# Patient Record
Sex: Female | Born: 1940
Health system: Southern US, Community
[De-identification: ages and names within clinical notes are randomized; demographics above are authoritative.]

## PROBLEM LIST (undated history)

## (undated) DIAGNOSIS — R0600 Dyspnea, unspecified: Secondary | ICD-10-CM

## (undated) DIAGNOSIS — C50919 Malignant neoplasm of unspecified site of unspecified female breast: Secondary | ICD-10-CM

## (undated) DIAGNOSIS — I10 Essential (primary) hypertension: Secondary | ICD-10-CM

## (undated) DIAGNOSIS — I2510 Atherosclerotic heart disease of native coronary artery without angina pectoris: Secondary | ICD-10-CM

## (undated) DIAGNOSIS — I214 Non-ST elevation (NSTEMI) myocardial infarction: Secondary | ICD-10-CM

## (undated) DIAGNOSIS — R0609 Other forms of dyspnea: Secondary | ICD-10-CM

## (undated) DIAGNOSIS — Z923 Personal history of irradiation: Secondary | ICD-10-CM

## (undated) DIAGNOSIS — Z952 Presence of prosthetic heart valve: Secondary | ICD-10-CM

## (undated) HISTORY — DX: Essential (primary) hypertension: I10

## (undated) HISTORY — PX: ABDOMINAL HYSTERECTOMY: SHX81

## (undated) HISTORY — DX: Presence of prosthetic heart valve: Z95.2

## (undated) HISTORY — DX: Dyspnea, unspecified: R06.00

## (undated) HISTORY — DX: Atherosclerotic heart disease of native coronary artery without angina pectoris: I25.10

## (undated) HISTORY — DX: Other forms of dyspnea: R06.09

## (undated) HISTORY — DX: Malignant neoplasm of unspecified site of unspecified female breast: C50.919

## (undated) HISTORY — DX: Non-ST elevation (NSTEMI) myocardial infarction: I21.4

---

## 2003-05-25 ENCOUNTER — Emergency Department (HOSPITAL_COMMUNITY): Admission: EM | Admit: 2003-05-25 | Discharge: 2003-05-25 | Payer: Self-pay | Admitting: Family Medicine

## 2004-08-11 HISTORY — PX: CARDIAC CATHETERIZATION: SHX172

## 2004-09-08 ENCOUNTER — Inpatient Hospital Stay (HOSPITAL_COMMUNITY): Admission: RE | Admit: 2004-09-08 | Discharge: 2004-09-14 | Payer: Self-pay | Admitting: Cardiothoracic Surgery

## 2004-09-08 ENCOUNTER — Encounter (INDEPENDENT_AMBULATORY_CARE_PROVIDER_SITE_OTHER): Payer: Self-pay | Admitting: *Deleted

## 2004-09-12 HISTORY — PX: AORTIC VALVE REPLACEMENT: SHX41

## 2004-10-01 ENCOUNTER — Encounter: Admission: RE | Admit: 2004-10-01 | Discharge: 2004-10-01 | Payer: Self-pay | Admitting: Cardiothoracic Surgery

## 2004-12-25 DIAGNOSIS — I214 Non-ST elevation (NSTEMI) myocardial infarction: Secondary | ICD-10-CM

## 2004-12-25 HISTORY — DX: Non-ST elevation (NSTEMI) myocardial infarction: I21.4

## 2005-01-08 ENCOUNTER — Ambulatory Visit (HOSPITAL_COMMUNITY): Admission: RE | Admit: 2005-01-08 | Discharge: 2005-01-08 | Payer: Self-pay | Admitting: Cardiology

## 2005-01-19 ENCOUNTER — Inpatient Hospital Stay (HOSPITAL_COMMUNITY): Admission: EM | Admit: 2005-01-19 | Discharge: 2005-01-26 | Payer: Self-pay | Admitting: *Deleted

## 2005-01-20 ENCOUNTER — Encounter (INDEPENDENT_AMBULATORY_CARE_PROVIDER_SITE_OTHER): Payer: Self-pay | Admitting: Cardiology

## 2005-01-20 HISTORY — PX: CARDIAC CATHETERIZATION: SHX172

## 2005-02-18 ENCOUNTER — Encounter (HOSPITAL_COMMUNITY): Admission: RE | Admit: 2005-02-18 | Discharge: 2005-05-19 | Payer: Self-pay | Admitting: Cardiology

## 2008-10-22 ENCOUNTER — Encounter: Admission: RE | Admit: 2008-10-22 | Discharge: 2008-10-22 | Payer: Self-pay | Admitting: Family Medicine

## 2009-03-24 ENCOUNTER — Inpatient Hospital Stay (HOSPITAL_COMMUNITY): Admission: EM | Admit: 2009-03-24 | Discharge: 2009-03-30 | Payer: Self-pay | Admitting: Emergency Medicine

## 2009-03-25 HISTORY — PX: CARDIAC CATHETERIZATION: SHX172

## 2010-04-15 LAB — POCT I-STAT, CHEM 8
BUN: 17 mg/dL (ref 6–23)
Calcium, Ion: 1.14 mmol/L (ref 1.12–1.32)
Chloride: 104 mEq/L (ref 96–112)
Creatinine, Ser: 0.8 mg/dL (ref 0.4–1.2)
Glucose, Bld: 101 mg/dL — ABNORMAL HIGH (ref 70–99)
HCT: 39 % (ref 36.0–46.0)
Hemoglobin: 13.3 g/dL (ref 12.0–15.0)
Potassium: 3.6 mEq/L (ref 3.5–5.1)
Sodium: 141 mEq/L (ref 135–145)
TCO2: 29 mmol/L (ref 0–100)

## 2010-04-15 LAB — URINALYSIS, ROUTINE W REFLEX MICROSCOPIC
Bilirubin Urine: NEGATIVE
Glucose, UA: NEGATIVE mg/dL
Hgb urine dipstick: NEGATIVE
Ketones, ur: NEGATIVE mg/dL
Nitrite: NEGATIVE
Protein, ur: NEGATIVE mg/dL
Specific Gravity, Urine: 1.012 (ref 1.005–1.030)
Urobilinogen, UA: 0.2 mg/dL (ref 0.0–1.0)
pH: 6.5 (ref 5.0–8.0)

## 2010-04-15 LAB — COMPREHENSIVE METABOLIC PANEL
ALT: 21 U/L (ref 0–35)
AST: 30 U/L (ref 0–37)
Albumin: 4.1 g/dL (ref 3.5–5.2)
Alkaline Phosphatase: 52 U/L (ref 39–117)
BUN: 13 mg/dL (ref 6–23)
CO2: 29 mEq/L (ref 19–32)
Calcium: 9.7 mg/dL (ref 8.4–10.5)
Chloride: 104 mEq/L (ref 96–112)
Creatinine, Ser: 0.83 mg/dL (ref 0.4–1.2)
GFR calc Af Amer: >60 mL/min (ref >60)
GFR calc non Af Amer: >60 mL/min (ref >60)
Glucose, Bld: 107 mg/dL — ABNORMAL HIGH (ref 70–99)
Potassium: 3.7 mEq/L (ref 3.5–5.1)
Sodium: 139 mEq/L (ref 135–145)
Total Bilirubin: 0.6 mg/dL (ref 0.3–1.2)
Total Protein: 7.4 g/dL (ref 6.0–8.3)

## 2010-04-15 LAB — DIFFERENTIAL
Basophils Absolute: 0 10*3/uL (ref 0.0–0.1)
Basophils Relative: 1 % (ref 0–1)
Eosinophils Absolute: 0.1 10*3/uL (ref 0.0–0.7)
Eosinophils Relative: 3 % (ref 0–5)
Lymphocytes Relative: 38 % (ref 12–46)
Lymphs Abs: 2 10*3/uL (ref 0.7–4.0)
Monocytes Absolute: 0.4 10*3/uL (ref 0.1–1.0)
Monocytes Relative: 8 % (ref 3–12)
Neutro Abs: 2.7 10*3/uL (ref 1.7–7.7)
Neutrophils Relative %: 52 % (ref 43–77)

## 2010-04-15 LAB — CBC
HCT: 38 % (ref 36.0–46.0)
Hemoglobin: 13.2 g/dL (ref 12.0–15.0)
MCHC: 34.6 g/dL (ref 30.0–36.0)
MCV: 93.1 fL (ref 78.0–100.0)
Platelets: 332 10*3/uL (ref 150–400)
RBC: 4.09 MIL/uL (ref 3.87–5.11)
RDW: 12.9 % (ref 11.5–15.5)
WBC: 5.3 10*3/uL (ref 4.0–10.5)

## 2010-04-15 LAB — CK TOTAL AND CKMB (NOT AT ARMC)
CK, MB: 1.7 ng/mL (ref 0.3–4.0)
Relative Index: 1.2 (ref 0.0–2.5)
Total CK: 144 U/L (ref 7–177)

## 2010-04-15 LAB — POCT CARDIAC MARKERS
CKMB, poc: 1.6 ng/mL (ref 1.0–8.0)
Myoglobin, poc: 103 ng/mL (ref 12–200)
Troponin i, poc: <0.05 ng/mL (ref 0.00–0.09)

## 2010-04-15 LAB — MRSA PCR SCREENING: MRSA by PCR: NEGATIVE

## 2010-04-15 LAB — PROTIME-INR
INR: 1.57 — ABNORMAL HIGH (ref 0.00–1.49)
Prothrombin Time: 18.6 seconds — ABNORMAL HIGH (ref 11.6–15.2)

## 2010-04-15 LAB — TROPONIN I: Troponin I: <0.01 ng/mL (ref 0.00–0.06)

## 2010-04-15 LAB — TSH: TSH: 2.589 u[IU]/mL (ref 0.350–4.500)

## 2010-04-16 ENCOUNTER — Other Ambulatory Visit: Payer: Self-pay | Admitting: Family Medicine

## 2010-04-16 DIAGNOSIS — M549 Dorsalgia, unspecified: Secondary | ICD-10-CM

## 2010-04-17 ENCOUNTER — Ambulatory Visit
Admission: RE | Admit: 2010-04-17 | Discharge: 2010-04-17 | Disposition: A | Discharge status: Home / Self Care | Payer: Medicare Other | Source: Ambulatory Visit | Attending: Family Medicine | Admitting: Family Medicine

## 2010-04-17 DIAGNOSIS — M549 Dorsalgia, unspecified: Secondary | ICD-10-CM

## 2010-04-19 LAB — CBC
HCT: 33.9 % — ABNORMAL LOW (ref 36.0–46.0)
HCT: 34.5 % — ABNORMAL LOW (ref 36.0–46.0)
HCT: 34.7 % — ABNORMAL LOW (ref 36.0–46.0)
Hemoglobin: 11.8 g/dL — ABNORMAL LOW (ref 12.0–15.0)
Hemoglobin: 13.8 g/dL (ref 12.0–15.0)
MCHC: 34.7 g/dL (ref 30.0–36.0)
MCHC: 34.8 g/dL (ref 30.0–36.0)
MCHC: 34.8 g/dL (ref 30.0–36.0)
MCHC: 34.8 g/dL (ref 30.0–36.0)
MCHC: 35.5 g/dL (ref 30.0–36.0)
MCV: 92.1 fL (ref 78.0–100.0)
MCV: 92.3 fL (ref 78.0–100.0)
MCV: 92.5 fL (ref 78.0–100.0)
MCV: 92.9 fL (ref 78.0–100.0)
MCV: 93.5 fL (ref 78.0–100.0)
Platelets: 271 10*3/uL (ref 150–400)
Platelets: 275 10*3/uL (ref 150–400)
Platelets: 280 10*3/uL (ref 150–400)
Platelets: 289 10*3/uL (ref 150–400)
Platelets: 296 10*3/uL (ref 150–400)
RBC: 3.75 MIL/uL — ABNORMAL LOW (ref 3.87–5.11)
RDW: 13.1 % (ref 11.5–15.5)
RDW: 13.3 % (ref 11.5–15.5)
RDW: 13.4 % (ref 11.5–15.5)
RDW: 13.5 % (ref 11.5–15.5)
WBC: 5.1 10*3/uL (ref 4.0–10.5)
WBC: 5.4 10*3/uL (ref 4.0–10.5)

## 2010-04-19 LAB — BASIC METABOLIC PANEL
BUN: 15 mg/dL (ref 6–23)
BUN: 16 mg/dL (ref 6–23)
BUN: 16 mg/dL (ref 6–23)
BUN: 16 mg/dL (ref 6–23)
BUN: 17 mg/dL (ref 6–23)
CO2: 20 mEq/L (ref 19–32)
CO2: 26 mEq/L (ref 19–32)
CO2: 27 mEq/L (ref 19–32)
CO2: 28 mEq/L (ref 19–32)
Calcium: 8.9 mg/dL (ref 8.4–10.5)
Calcium: 8.9 mg/dL (ref 8.4–10.5)
Chloride: 104 mEq/L (ref 96–112)
Chloride: 109 mEq/L (ref 96–112)
Creatinine, Ser: 0.76 mg/dL (ref 0.4–1.2)
Creatinine, Ser: 0.79 mg/dL (ref 0.4–1.2)
Creatinine, Ser: 0.82 mg/dL (ref 0.4–1.2)
Creatinine, Ser: 0.84 mg/dL (ref 0.4–1.2)
GFR calc Af Amer: >60 mL/min (ref >60)
GFR calc non Af Amer: >60 mL/min (ref >60)
GFR calc non Af Amer: >60 mL/min (ref >60)
Glucose, Bld: 101 mg/dL — ABNORMAL HIGH (ref 70–99)
Glucose, Bld: 103 mg/dL — ABNORMAL HIGH (ref 70–99)
Glucose, Bld: 114 mg/dL — ABNORMAL HIGH (ref 70–99)
Potassium: 3.3 mEq/L — ABNORMAL LOW (ref 3.5–5.1)
Potassium: 3.8 mEq/L (ref 3.5–5.1)
Potassium: 3.8 mEq/L (ref 3.5–5.1)
Sodium: 138 mEq/L (ref 135–145)

## 2010-04-19 LAB — URINALYSIS, ROUTINE W REFLEX MICROSCOPIC
Glucose, UA: NEGATIVE mg/dL
Hgb urine dipstick: NEGATIVE
Protein, ur: NEGATIVE mg/dL
pH: 8 (ref 5.0–8.0)

## 2010-04-19 LAB — POTASSIUM: Potassium: 3.7 mEq/L (ref 3.5–5.1)

## 2010-04-19 LAB — LIPID PANEL
HDL: 39 mg/dL — ABNORMAL LOW (ref >39)
LDL Cholesterol: 71 mg/dL (ref 0–99)
Triglycerides: 57 mg/dL (ref <150)
VLDL: 11 mg/dL (ref 0–40)

## 2010-04-19 LAB — CARDIAC PANEL(CRET KIN+CKTOT+MB+TROPI)
CK, MB: 1.3 ng/mL (ref 0.3–4.0)
Relative Index: INVALID (ref 0.0–2.5)
Total CK: 84 U/L (ref 7–177)
Troponin I: 0.01 ng/mL (ref 0.00–0.06)
Troponin I: 0.02 ng/mL (ref 0.00–0.06)

## 2010-04-19 LAB — HEPARIN LEVEL (UNFRACTIONATED)
Heparin Unfractionated: 0.16 IU/mL — ABNORMAL LOW (ref 0.30–0.70)
Heparin Unfractionated: 0.31 IU/mL (ref 0.30–0.70)
Heparin Unfractionated: 0.73 IU/mL — ABNORMAL HIGH (ref 0.30–0.70)

## 2010-04-19 LAB — PROTIME-INR
INR: 1.21 (ref 0.00–1.49)
Prothrombin Time: 15.2 seconds (ref 11.6–15.2)
Prothrombin Time: 16.8 seconds — ABNORMAL HIGH (ref 11.6–15.2)
Prothrombin Time: 18.8 seconds — ABNORMAL HIGH (ref 11.6–15.2)

## 2010-05-06 ENCOUNTER — Other Ambulatory Visit: Payer: Self-pay | Admitting: Dermatology

## 2010-06-10 ENCOUNTER — Ambulatory Visit: Payer: Medicare Other | Attending: Gynecologic Oncology | Admitting: Gynecologic Oncology

## 2010-06-10 DIAGNOSIS — N83209 Unspecified ovarian cyst, unspecified side: Secondary | ICD-10-CM | POA: Insufficient documentation

## 2010-06-10 DIAGNOSIS — Z79899 Other long term (current) drug therapy: Secondary | ICD-10-CM | POA: Insufficient documentation

## 2010-06-10 DIAGNOSIS — I10 Essential (primary) hypertension: Secondary | ICD-10-CM | POA: Insufficient documentation

## 2010-06-10 DIAGNOSIS — Z9861 Coronary angioplasty status: Secondary | ICD-10-CM | POA: Insufficient documentation

## 2010-06-10 DIAGNOSIS — M899 Disorder of bone, unspecified: Secondary | ICD-10-CM | POA: Insufficient documentation

## 2010-06-10 DIAGNOSIS — I252 Old myocardial infarction: Secondary | ICD-10-CM | POA: Insufficient documentation

## 2010-06-10 DIAGNOSIS — Z85828 Personal history of other malignant neoplasm of skin: Secondary | ICD-10-CM | POA: Insufficient documentation

## 2010-06-10 DIAGNOSIS — R978 Other abnormal tumor markers: Secondary | ICD-10-CM | POA: Insufficient documentation

## 2010-06-10 DIAGNOSIS — E7800 Pure hypercholesterolemia, unspecified: Secondary | ICD-10-CM | POA: Insufficient documentation

## 2010-06-10 DIAGNOSIS — I2510 Atherosclerotic heart disease of native coronary artery without angina pectoris: Secondary | ICD-10-CM | POA: Insufficient documentation

## 2010-06-10 DIAGNOSIS — Z7901 Long term (current) use of anticoagulants: Secondary | ICD-10-CM | POA: Insufficient documentation

## 2010-06-10 DIAGNOSIS — M949 Disorder of cartilage, unspecified: Secondary | ICD-10-CM | POA: Insufficient documentation

## 2010-06-10 DIAGNOSIS — Z889 Allergy status to unspecified drugs, medicaments and biological substances status: Secondary | ICD-10-CM | POA: Insufficient documentation

## 2010-06-10 DIAGNOSIS — R42 Dizziness and giddiness: Secondary | ICD-10-CM | POA: Insufficient documentation

## 2010-06-10 DIAGNOSIS — Z954 Presence of other heart-valve replacement: Secondary | ICD-10-CM | POA: Insufficient documentation

## 2010-06-10 DIAGNOSIS — Z7982 Long term (current) use of aspirin: Secondary | ICD-10-CM | POA: Insufficient documentation

## 2010-06-11 NOTE — Consult Note (Signed)
NAMEFAIZAH, Rose                ACCOUNT NO.:  [id number]  MEDICAL RECORD NO.:  [id number]           PATIENT TYPE:  O  LOCATION:  GYN                          FACILITY:  [facility]  PHYSICIAN:  Ohana Birdwell A. Duard Brady, MD    DATE OF BIRTH:  [birth year]  DATE OF CONSULTATION:  06/10/2010 DATE OF DISCHARGE:                                CONSULTATION   REFERRING PHYSICIAN:  Patsy Baltimore, MD  HISTORY OF PRESENT ILLNESS:  The patient is seen today in consultation at the request of Dr. Gevena Cotton.  [name] is a very pleasant 70 year old gravida 1, para 1.  Her history dates back to 2010.  Her doctors felt that she could not have a colonoscopy secondary to being on Coumadin for her AVR replacement, so she had a "virtual colonoscopy" and had a CT scan that showed these adnexal masses.  She has been followed up for them since 2010.  In 2010, she had a right ovarian cyst that measured 3.3 x 2.3 cm.  In 2011, the right ovarian cyst measured 3.4 x 2.6 cm with a thin septation.  The left ovarian cyst was 1.3 x 1.3 x 1.2 cm. In 2012, the cyst was 3.4 x 2.4 x 2 cm with a thin septation.  The left ovarian cyst was 1.4 x 1 x 1.1 cm.  This septation was avascular. However, in April 2012, a CA-125 was drawn that was 37.5, and because of that slight elevation, she is subsequently referred to [place].  The patient is otherwise in her usual state of health.  REVIEW OF SYSTEMS:  She denies any chest pain, shortness of breath, nausea, vomiting, fevers, chills, headaches, visual changes.  She denies any intentional change in bowel or bladder habits, any vaginal bleeding, any bloating, any early satiety.  She and her husband are not sexually active, so she does not know about any dyspareunia.  PAST MEDICAL HISTORY:  Significant for: 1. Hypertension. 2. Aortic stenosis, status post AVR in August of 2006. 3. CAD, status post an MI and PTCA in December 2006. 4. Hypercholesterolemia. 5. Recurrent vertigo which is  better. 6. Basal cell skin cancer of her face and squamous cell skin cancer of     her shoulder. 7. Migraine headaches but she has not had one for 15 years. 8. History of allergies to medications. 9. Osteopenia.  MEDICATIONS: 1. Amlodipine 5 mg daily. 2. Losartan. 3. HCTZ 100/25 one p.o. daily. 4. Crestor 5 mg daily. 5. Coumadin 5 mg 1-1/2 tablets every day, except 1 tablet on Sunday. 6. Omeprazole 20 mg daily. 7. Fish oil 1000 mg twice daily. 8. Baby aspirin once daily. 9. Calcium with D. 10.Multivitamin. 11.Gabapentin 100 mg twice daily.  PAST SURGICAL HISTORY:  Vaginal hysterectomy in 1973 for bleeding and pain, breast biopsies x3, tonsillectomy, aortic valve replacement, St. Jude in August 2006.  FAMILY HISTORY:  Her father had alcoholism.  Her mother had diabetes, arthritis, CAD, hypertension, and AVR.  Brother with thyroid disease, CAD and AVR.  Sister #1, thyroid disease, CAD, hypertension, AVR. Sister with thyroid disease and carotid disease.  ALLERGIES: 1. CODEINE which causes nausea  and vomiting 2. SULFA which causes nausea and vomiting. 3. VERAPAMIL causes constipation. 4. NITROGLYCERIN causes low blood pressure.  SOCIAL HISTORY:  She denies use of tobacco or alcohol.  She is married. She is retired from [employer].  She works currently as a Lawyer.  PAST GYN HISTORY:  She has had an NSVD x1 in 1971.  REVIEW OF SYSTEMS:  As above, and otherwise 10-point review of systems is negative.  PHYSICAL EXAMINATION:  VITAL SIGNS:  Height is 5 feet 5 inches, weight 200 pounds.  Blood pressure 116/80, pulse 60, respirations 16, temperature 98.5. GENERAL:  Well-nourished, well-developed female in no acute distress. NECK:  Supple.  There is no lymphadenopathy.  No thyromegaly. LUNGS:  Clear to auscultation bilaterally. CARDIOVASCULAR:  Regular rate and rhythm. ABDOMEN:  Obese, soft, nontender, nondistended.  No palpable masses or hepatosplenomegaly.   Groins are negative for adenopathy. EXTREMITIES:  There is no edema. PELVIC:  External genitalia within normal limits.  Bimanual examination reveals no masses or nodularity.  ASSESSMENT:  70 year old with stable right ovarian and left ovarian cyst that had been followed expectantly since 2010.  CA-125 was drawn in April that was elevated at 37.5.  It is really unclear what to make of this slight elevation her CA-125, but as the cyst has not changed, her septation is avascular, she is asymptomatic, there is no ascites noted on ultrasound, I would follow this expectantly.  The patient is also high-risk for surgery based on her multiple medical issues.  PLAN:  I would recommend rechecking a CA-125 in 6 months from the last one, so that would be October of 2012.  If that is the same level or lower, then I would not necessarily recheck those in the future and would just merely follow her up with transvaginal ultrasound yearly. That being said, I did review with her signs and symptoms of ovarian carcinoma including bloating, early satiety, abdominal and pelvic pain, change in bowel or bladder habits. She was counseled that if any of these symptoms appear to please contact us or Dr Gevena Cotton.  At this time, I will not schedule an appointment for her to return to see [place] automatically.  Will ask Dr. Gevena Cotton to check the CA-125.  She knows we will be happy to see her in the future should the need arise.     Nolia Tschantz A. Duard Brady, MD     PAG/MEDQ  D:  06/10/2010  T:  06/10/2010  Job:  [id number]  cc:   Bryan Lemma. Manus Gunning, M.D. Fax: [phone]  Telford Nab, R.N. [address] [place], [place] [address]  Patsy Baltimore, MD Fax: [phone] \  Electronically Signed by Cleda Mccreedy MD on 06/11/2010 02:10:35 PM

## 2010-06-12 NOTE — Discharge Summary (Signed)
Sharon Rose, CASLIN NO.:  [id number]   MEDICAL RECORD NO.:  [id number]          PATIENT TYPE:  INP   LOCATION:  2040                         FACILITY:  [facility]   PHYSICIAN:  Thereasa Solo. Little, M.D. DATE OF BIRTH:  1940/12/17   DATE OF ADMISSION:  01/19/2005  DATE OF DISCHARGE:  01/26/2005                                 DISCHARGE SUMMARY   DISCHARGE DIAGNOSES:  1.  ST myocardial infarction to OM-2 occlusion this admission.  2.  History of aortic stenosis, status post St. Jude AVR August 2006.  3.  Past history of paroxysmal atrial fibrillation postoperatively August      2006, holding sinus rhythm.  4.  Dyslipidemia   HOSPITAL COURSE:  The patient is a 70 year old female who had a St. Jude AVR  August 2006 by Dr. Tyrone Sage. This was complicated by postoperative atrial  fibrillation which required DC cardioversion, she has been holding sinus  rhythm. At the time of her catheterization she had normal coronaries. She  was admitted January 19, 2005 with chest pain consistent with unstable  angina. Initial enzymes were negative. In the emergency room she became  hypotensive after nitroglycerin. She was admitted to telemetry. Her second  set of enzymes came back positive. They peaked at 1780 with 145 mb's. Her  Coumadin was held. She was put on heparin when her INR dropped below 2. She  was set up for catheterization which was done January 22, 2005 Dr. Elsie Lincoln.  This revealed normal RCA, normal left main, normal LAD, normal circumflex  with a total mid OM-2 occlusion with some faint collaterals. Plan is for  medical therapy. We initially tried her on Norvasc. She was unable to  tolerate multiple antihypertensives and these were pared down at discharge.  Echocardiogram showed EF to be 50-55%. She was put back on Coumadin and we  feel she can be discharged January 26, 2005. Her INR is 2.0 at discharge.   DISCHARGE MEDICATIONS:  1.  Coumadin 7.5 mg Monday, Wednesday,  Friday and 5 mg other days.  2.  Aspirin 81 mg a day.  3.  Toprol XL 25 mg a day.  4.  Estrace 1 mg a day.  5.  Claritin 10 mg a day.  6.  Pravachol 40 mg a day.  7.  Prilosec OTC once a day.   She will follow up with Dr. Elsie Lincoln in about a week.   LABORATORY DATA:  White count 5.5, hemoglobin 13, hematocrit 37.5, platelets  315,000. INR at admission was 0.1. Sodium 138, potassium 4.0, BUN 10,  creatinine 0.7. Liver functions on admission showed an AST of 167, ALT of  30, CKs peaked as noted above. Cholesterol was 146, LDL 80, HDL 49. TSH  1.260. Urinalysis unremarkable.   Chest x-ray shows no active disease.   DISPOSITION:  The patient is discharged in stable condition and will follow  up with Dr. Elsie Lincoln in about a week. She will have a pro-time next week. She  will need follow-up LFTs and lipid panel as an outpatient.      Abelino Derrick, P.A.  ______________________________  Thereasa Solo Little, M.D.    Lenard Lance  D:  01/26/2005  T:  01/26/2005  Job:  [id number]   cc:   Bryan Lemma. Manus Gunning, M.D.  Fax: [phone]   Madaline Savage, M.D.  Fax: [phone]

## 2010-06-12 NOTE — Cardiovascular Report (Signed)
Sharon Rose, CAPOCCIA NO.:  [id number]   MEDICAL RECORD NO.:  [id number]          PATIENT TYPE:  INP   LOCATION:  2902                         FACILITY:  [facility]   PHYSICIAN:  Madaline Savage, M.D.DATE OF BIRTH:  [birth year]   DATE OF PROCEDURE:  01/20/2005  DATE OF DISCHARGE:                              CARDIAC CATHETERIZATION   PROCEDURES PERFORMED:  Selective coronary angiography by Judkins technique.  No LV angiogram or retrograde heart cath was performed due to a St. Jude's  aortic valve.   PATIENT PROFILE:  This is a delightful 70 year old white married female who  recently underwent aortic valve replacement on September 13, 2004 by Dr. Sheliah Plane. The patient's pre procedure coronary angiogram failed to show any  evidence of obstructive coronary disease. She had aortic valvular stenosis  and did well from the standpoint of her aortic valve replacement. She did  have some postoperative atrial fibrillation. She entered the hospital on  January 19, 2005 with an episode of chest pain that had been relieved at  the time she was admitted. The first cardiac enzyme and EKG was negative.  She subsequently developed a rise in enzymes and a very faint amount of ST-T  abnormality by the time an EKG was done the next day. She was felt to have a  non-Q-wave myocardial infarction with peak CKs of 1700 and a peak troponin  of 45. No further chest pain has occurred during hospitalization. The  patient has been crossed over from Coumadin to heparin and now that her  prothrombin time, INR has gone down to 1.4 we are able to come to the cath  lab today for coronary artery anatomy evaluation. This procedure was  performed via right percutaneous femoral approach using the Smart needle to  enter the right femoral artery with 6-French catheters without  complications.   RESULTS:  The left main coronary artery was virtually nonexistent. The  circumflex and the LAD arose by  separate ostia.   The LAD was a medium-sized vessel giving rise to one large septal perforator  branch and then the LAD gave rise to one medium-sized diagonal branch about  25 mm down from the first septal perforator branch. This diagonal and LAD  were both normal as well as a septal perforator branch.   The left circumflex coronary artery was a dominant vessel in this  circulation, giving rise to a small first obtuse marginal branch, a second  small but longer obtuse marginal branch, a huge third obtuse marginal branch  and then the circumflex itself coursed through atrioventricular groove and  gave rise to a posterolateral branch. There was noted to be a string sign in  the second obtuse marginal branch over about 40 mm in length and there was  retrograde collateral flow into this vessel by retrograde collaterals. This  was felt not to be amenable to stenting and would not be a suitable  candidate for bypass grafting either.   Right coronary artery was small and nondominant and gave rise to very small  distal branches. It was a nondominant vessel in  circulation.   PLAN:  The patient will be treated medically. She will have her heparin  restarted to protect her valves. She will be restarted on Coumadin. She will  be discharged at the appropriate time on medical therapy for her coronary  disease.   FINAL DIAGNOSIS:  1.  Recent non-Q-wave myocardial infarction by enzyme and clinical enzymes      and clinical information.  2.  Interval occlusion of the second obtuse marginal branch of left      circumflex with retrograde collateral flow (when compared to last      catheterization dated August 31, 2004).   ADDENDUM:  The patient's St. Jude aortic valve shows excellent movement of  the bileaflet tilting disk valve on fluoroscopy.           ______________________________  Madaline Savage, M.D.     WHG/MEDQ  D:  01/22/2005  T:  01/23/2005  Job:  [id number]   cc:   Sheliah Plane,  MD  [address]  [place]  [place] [address]   Bryan Lemma. Manus Gunning, M.D.  Fax: [phone]

## 2010-06-12 NOTE — Discharge Summary (Signed)
NAMESAMIRAH, MACARTHUR NO.:  [id number]   MEDICAL RECORD NO.:  [id number]          PATIENT TYPE:  INP   LOCATION:  2003                         FACILITY:  [facility]   PHYSICIAN:  Sheliah Plane, MD    DATE OF BIRTH:  11-12-1940   DATE OF ADMISSION:  09/08/2004  DATE OF DISCHARGE:  09/14/2004                                 DISCHARGE SUMMARY   PRIMARY ADMITTING DIAGNOSIS:  Aortic stenosis.   ADDITIONAL/DISCHARGE DIAGNOSES:  1.  Aortic stenosis.  2.  Hypertension.  3.  hyperlipidemia.  4.  postoperative anemia.   PROCEDURES PERFORMED:  Aortic valve replacement with 23-mm St. Jude  mechanical aortic valve.   HISTORY:  The patient is a 70 year old female who has been followed by Dr.  Elsie Lincoln for the past 8-9 years with progressive aortic stenosis.  She has  undergone serial echocardiograms over this period of time.  Recently, she  was noted to have progressive aortic stenosis by echo.  Upon questioning,  the patient does note increasing fatigue and shortness of breath with  exertion.  Because of her worsening symptoms and her progressive aortic  stenosis by echo, she underwent cardiac catheterization recently which  showed patent coronary arteries.  Catheterization also confirmed aortic  valve area of approximately 0.9.  She was referred subsequently to Dr.  Sheliah Plane for consideration of aortic valve replacement.  Dr. Tyrone Sage  explained the risks, benefits and alternatives of the procedure to the  patient and she agreed to surgery.   HOSPITAL COURSE:  She was admitted to [facility] on September 08, 2004  and underwent aortic valve replacement as described in detail above,  performed by Dr. Tyrone Sage.  She tolerated the procedure well and was  transferred to the SICU in stable condition.  She was able to be extubated  shortly after surgery.  She was hemodynamically stable and doing well on  postop day #1.  She did require low-dose Neo-Synephrine drip  initially, but  this was weaned over the course of the first postoperative day and  discontinued.  By postop day #2 she was ready for transfer to the floor.  Overall, she has done well postoperatively.  She was started on Coumadin and  at the time of discharge, her INR is therapeutic at 1.8.  She has been  mildly anemic and was started on iron supplementation as well as folic acid.  She has been mobilized and presently is ambulating the hallways without  difficulty with Cardiac Rehab Phase 1 as well as independently.  Her  surgical incision sites are all healing well.  She is tolerating a regular  diet and is having normal bowel and bladder function.  Her most recent labs  prior to discharge show a hemoglobin of 8.2, hematocrit 23.8, white count  4.1, platelets 322.000; PT 20.9, INR 1.8; sodium 136, potassium 3.8, which  has been supplemented, BUN 13, and creatinine 0.8.  Dr. Tyrone Sage saw and  evaluated the patient on morning rounds on September 14, 2004 and felt that she  was ready for discharge home at that time.  DISCHARGE MEDICATIONS:  1.  Toprol-XL 25 mg daily.  2.  Pravachol 20 mg daily.  3.  Coumadin 5 mg daily until INR is checked.  4.  Nu-Iron 150 mg daily.  5.  Folic acid 1 mg daily.  6 .  Estradiol 0.5 mg daily.  1.  Fexofenadine 60 mg twice daily.  2.  Calcium +D 600 mg twice daily.  3.  Multivitamin daily.  4.  Ultram 50 mg to 100 mg q.4-6 h. p.r.n. for pain.   DISCHARGE INSTRUCTIONS:  She is asked to refrain from driving, heavy lifting  or strenuous activity.  She may continue ambulating daily and using her  incentive spirometer.  She may shower daily and clean her incisions with  soap and water.  She will continue a low-fat, low-sodium diet.   DISCHARGE FOLLOWUP:  She is asked to have a PT and INR drawn on September 15, 2004 at Dr. Truett Perna office for management of her Coumadin.  She is also  asked to make an appointment see Dr. Elsie Lincoln back in 2 weeks for followup.  She  will see Dr. Tyrone Sage on October 01, 2004 at 11:10 a.m.  She will have  a chest x-ray 1 hour prior to this appointment at [facility].  In the interim, if she experiences problems or has questions, she is asked  to contact our office.      Coral Ceo, P.A.      Sheliah Plane, MD  Electronically Signed    GC/MEDQ  D:  11/13/2004  T:  11/14/2004  Job:  [id number]   cc:   Madaline Savage, M.D.  Fax: [phone]   Bryan Lemma. Manus Gunning, M.D.  Fax: [phone]

## 2010-06-12 NOTE — Op Note (Signed)
NAMESEYNABOU, FULTS NO.:  [id number]   MEDICAL RECORD NO.:  [id number]          PATIENT TYPE:  INP   LOCATION:  2306                         FACILITY:  [facility]   PHYSICIAN:  Sheldon Silvan, M.D.      DATE OF BIRTH:  Mar 25, 1940   DATE OF PROCEDURE:  09/08/2004  DATE OF DISCHARGE:                                 OPERATIVE REPORT   PROCEDURE PERFORMED:  Transesophageal echocardiography (intraoperative).   Ms. Shelden was brought to the operating room by Dr. Tyrone Sage for replacement  of her aortic valve due to aortic stenosis.  After appropriate placement of  monitors, she was taken to the operating room.  It was determined that use  of TEE intraoperatively would be beneficial for both diagnostic and  monitoring purposes.   DESCRIPTION OF PROCEDURE:  After satisfactory induction of general  anesthesia, including endotracheal intubation, the [employer] OmniPlane  TEE probe was sheathed and lubricated appropriately.  It was passed easily  through the oropharynx into the esophagus.  The heart was imaged.   The left ventricle was seen to be moderately thickened concentrically.  There was good contractility in all four segments.   The left atrial appendage was imaged and found to be clear of thrombus or  smoke.   The mitral valve was imaged in a longitudinal view and on color flow exam as  well.  There was only trace regurgitation noted centrally.  Structurally,  the valve appeared normal.   The aortic valve was imaged and was found to be very sclerotic.  It was  difficult to tell whether or not it was bifid.  On longitudinal view and  Color Flow exam there was obvious aortic stenosis that was severe, with only  trace to 1+ regurgitation centrally.   The right ventricle was imaged and was felt to be normal.  The tricuspid  valve was imaged and on color flow exam had only trace to 1+ regurgitation.   The interatrial septum was examined with color flow and there  was no PFO  noted.   The patient was placed on cardiopulmonary bypass by Sheliah Plane, MD and  the aortic valve replaced with a #23 prosthesis.   At completion of the valve placement and closure of the heart, the patient  was weaned from bypass.  The valve was imaged on TEE and found to be placed  well with no regurgitant flow noted around the suture line.  There was very  little stenosis noted in the forward flow across the valve.  There was no  regurgitation centrally.  The patient tolerated  weaning from the pump adequately.  The left ventricle was imaged and the  images were used for monitoring of filling of the heart.  The other  structures in the heart appeared unchanged form prebypass.  The patient was  transported to the SICU after completion of the operation and removal of the  TEE probe.      Sheldon Silvan, M.D.  Electronically Signed     DC/MEDQ  D:  09/09/2004  T:  09/09/2004  Job:  [id number]

## 2010-06-12 NOTE — Op Note (Signed)
NAMEKATONA, SECKER NO.:  [id number]   MEDICAL RECORD NO.:  [id number]          PATIENT TYPE:  INP   LOCATION:  2003                         FACILITY:  [facility]   PHYSICIAN:  Sheliah Plane, MD    DATE OF BIRTH:  1940-08-29   DATE OF PROCEDURE:  DATE OF DISCHARGE:                                 OPERATIVE REPORT   PREOPERATIVE DIAGNOSIS:  Critical aortic stenosis.   POSTOPERATIVE DIAGNOSIS:  Critical aortic stenosis.   OPERATION/PROCEDURE:  Aortic valve replacement with St. Jude mechanical  heart valve, model #23 AK-501, serial [id number].   SURGEON:  Gwenith Daily. Tyrone Sage, M.D.   FIRST ASSISTANT:  Lennox Solders R.N.F.A.   BRIEF HISTORY:  The patient is a 70 year old female with increasing symptoms  of congestive heart failure with longstanding known history of aortic  stenosis which has become progressive on echocardiography.  Of note, the  patient's brother a week prior to surgery also underwent aortic valve  replacement for aortic stenosis.  The patient has no contraindications for  Coumadin and choice of valve replacement were discussed with her in detail.  She had no evidence of coronary artery disease and was willing to take  Coumadin.  A mechanical valve was selected.  The patient agreed.   DESCRIPTION OF PROCEDURE:  With Swan-Ganz catheter and arterial line  monitors in place, the patient underwent general endotracheal anesthesia  without incident.  The skin of the chest and back was prepped with Betadine  and draped in the usual sterile manner.  Median sternotomy was performed,  pericardium was opened and overall ventricular function appeared preserved.  The patient was preserved.  The patient was systemically heparinized.  Ascending aorta and the right atrium were cannulated and the aortic root  vented.  Cardioplegia needle was introduced into the ascending aorta.   The patient was placed on cardiopulmonary bypass, 2.4 L/per minute/sq m.  A  right superior pulmonary vein vent was placed.  The patient's body  temperature was cooled to 30 degrees.  Aortic crossclamp was applied and 500  mL of cold blood potassium cardioplegia was administered with rapid  diastolic arrest of the heart.  Myocardial central temperature was monitored  throughout the crossclamp.   Attention was turned first to a transverse aortotomy. It gave good exposure  of the aortic valve which was a highly calcified bicuspid aortic valve,  oriented anterior posterior.  The valve was excised and annulus debrided of  all loose calcific material.  A St. Jude mechanical valve was then selected  and sized for 23 flap.  The patient's aorta was _normal in size___________.  The #2 Tycron pledgeted sutures, 16 in all, were placed circumferentially  around the aortic valve annulus.  These were used to secure the mechanical  prosthetic valve in place.  There was free movement of the leaflets.  The  was no impingement on the right or left coronary ostium.  During the  procedure, intermittent cold blood cardioplegia was administered directly  into ____coronary ostium______.  Aortotomy was then closed with a horizontal  mattress 4-0 Prolene suture over felt strips.  Before complete closure of  the aortotomy, the heart was allowed to __passivly_________ and de-air.  Aortotomy was completed.  Aortic crossclamp was removed with total  crossclamp time of 75 minutes.  A 16-gauge needle was then placed into  ventricular apex to further de-air the heart. Aortic crossclamp was removed.  Total crossclamp time was 75 minutes.  The patient simultaneously converted  to a sinus rhythm and then intermittent heart block.  Two atrial and two  ventricular pacing wires were applied.  The patient was then ventilated and  weaned from coronary bypass without difficulty.  She remained  hemodynamically stable.  She was decannulated in the usual fashion.  Protamine sulfate was administered with the  operative field hemostatic. Two  atrial and ventricular pacing wires were applied.  The patient remained  hemodynamically stable.  Pericardium was reapproximated.  Two mediastinal  tubes were left in place.  Sternum was closed with #6 stainless steel wire,  fascia closed with interrupted 0 Vicryl, running 3-0 Vicryl in the  subcutaneous tissue with 4-0 subcuticular stitch in skin edges.  Dry  dressings were applied.  Sponge and needle count was reported as correct at  completion of the procedure.  The patient tolerated the procedure without  obvious complications and was transferred to the surgical intensive care  unit for further postoperative care.  Total pump time was 99 minutes.      Sheliah Plane, MD  Electronically Signed     EG/MEDQ  D:  09/12/2004  T:  09/12/2004  Job:  [id number]   cc:   Madaline Savage, M.D.  [address], [address]  [place]  [place] [address]  Fax: [phone]

## 2010-06-12 NOTE — Cardiovascular Report (Signed)
NAMEALISAN, DOKES NO.:  [id number]   MEDICAL RECORD NO.:  [id number]          PATIENT TYPE:  OIB   LOCATION:  2852                         FACILITY:  [facility]   PHYSICIAN:  Madaline Savage, M.D.DATE OF BIRTH:  [birth year]   DATE OF PROCEDURE:  01/08/2005  DATE OF DISCHARGE:  01/08/2005                              CARDIAC CATHETERIZATION   PROCEDURE PERFORMED:  Elective outpatient cardioversion.   ANESTHESIOLOGIST:  Sharee Holster, M.D.   CARDIOLOGIST:  Madaline Savage, M.D.   AGENT USED:  Pentothal.   SHOCKS GIVEN:  1-150 joules.   RESULTS:  The patient was converted from atrial flutter to a sinus rhythm  with a borderline first-degree AV block.   COMPLICATIONS:  None.   PATIENT PROFILE:  Ms. Wermuth is a delightful 70 year old white female who  had a St. Jude aortic valve replacement on September 13, 2004. She had normal  coronaries. She had some atrial fibrillation postoperatively that was  asymptomatic. She has been on Coumadin and Toprol. Her INR's have been  therapeutic for the last four weeks and today's outpatient procedure was  scheduled electively. No complications occurred. She converted to sinus  rhythm and she will be followed up as an outpatient in the near future.           ______________________________  Madaline Savage, M.D.     WHG/MEDQ  D:  01/08/2005  T:  01/09/2005  Job:  [id number]   cc:   Bryan Lemma. Manus Gunning, M.D.  Fax: [phone]

## 2011-02-04 ENCOUNTER — Other Ambulatory Visit: Payer: Self-pay | Admitting: Nurse Practitioner

## 2011-02-24 IMAGING — CT CT VIRTUAL COLONOSCOPY SCREENING
3 of 7 series · 11 of 36 positions shown, 17 images · non-contrast
Comparison: CT angio abdomen of 01/19/2005

CLINICAL DATA: Screening for colon carcinoma, the patient is on
anticoagulation

CT VIRTUAL COLONOSCOPY FOR SCREENING
TECHNIQUE: The patient was given a standard low so bowel
preparation with Gastrografin and barium for fluid and stool
tagging respectively.  The quality of the bowel preparation is
excellent.  Automated CO2 insufflation of the colon was performed
prior to image acquisition and colonic distention is moderate.
Image post processing was used to generate a 3D endoluminal fly-
through projection of the colon and to electronically subtract
stool/fluid as appropriate.

[Series 6: prone (id) · axial · 0.75mm/px · z∈[-406,-24]mm · 7 of 410 slices shown, 12 images (1 of 2)]
[im 52/410  soft-tissue]
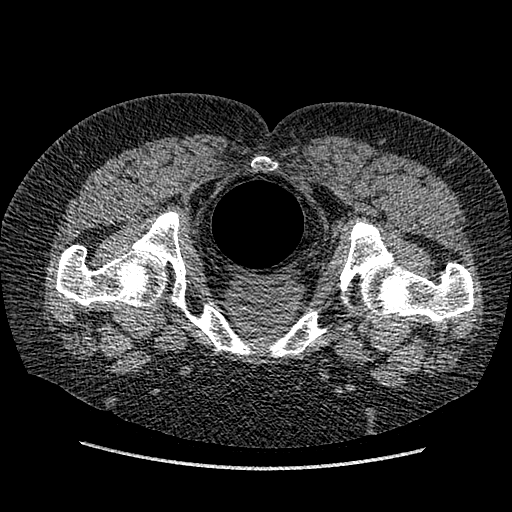
[im 52/410  bone]
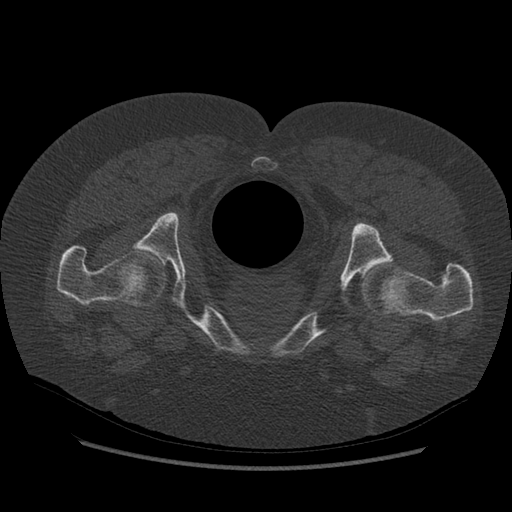
[im 103/410  soft-tissue]
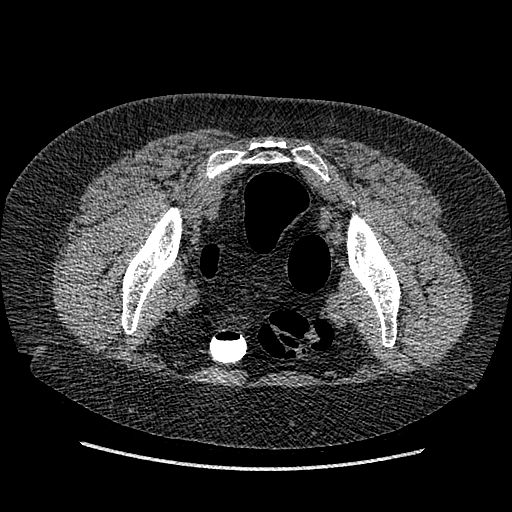
[im 154/410  soft-tissue]
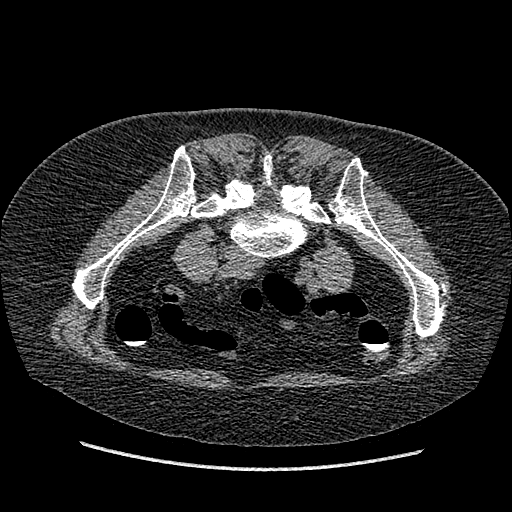
[im 205/410  soft-tissue]
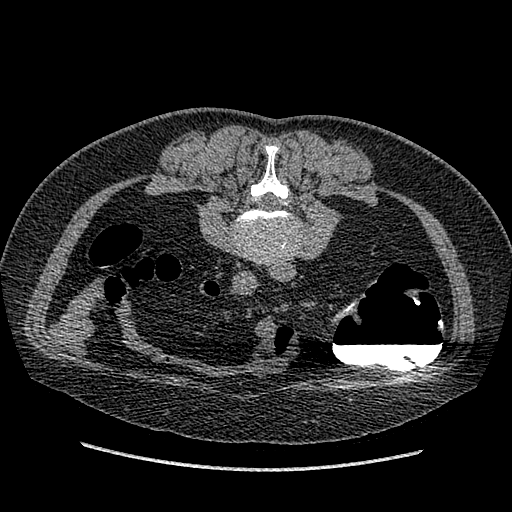
[im 205/410  lung]
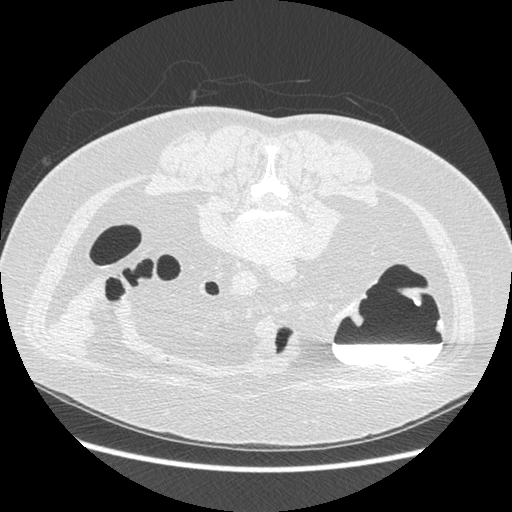
[im 256/410  soft-tissue]
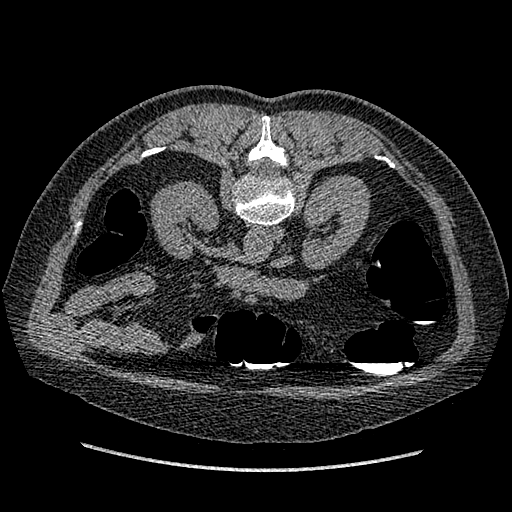
[im 256/410  lung]
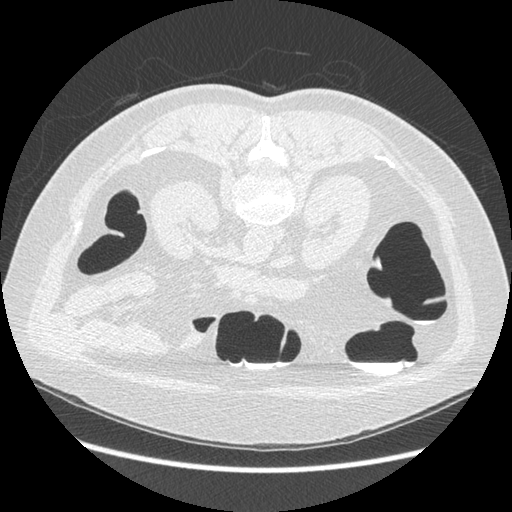
[im 307/410  soft-tissue]
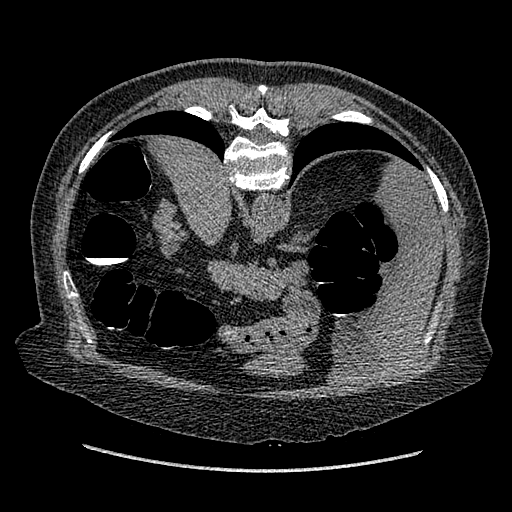
[im 307/410  lung]
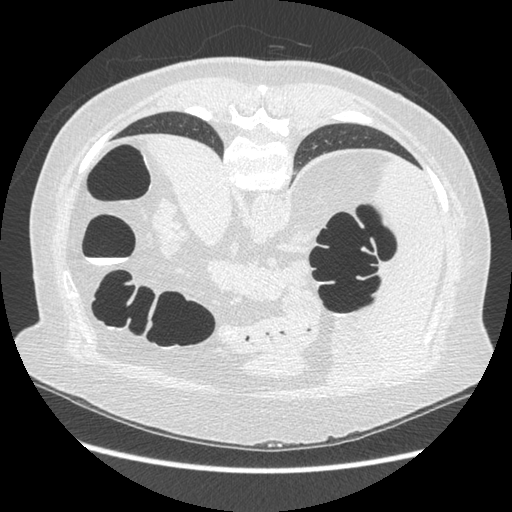
[im 358/410  soft-tissue]
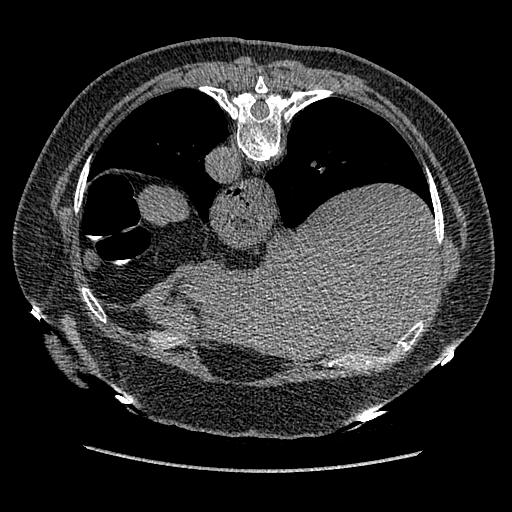
[im 358/410  lung]
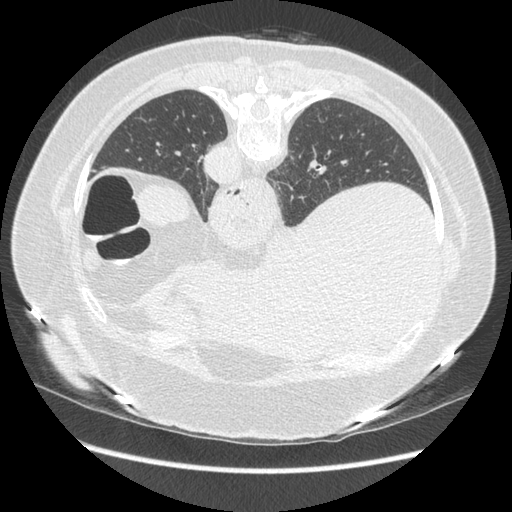

[Series 7: prone (id) · axial · 0.75mm/px · z∈[-341,-86]mm · 3 of 205 slices shown (2 of 2)]
[im 52/205  soft-tissue]
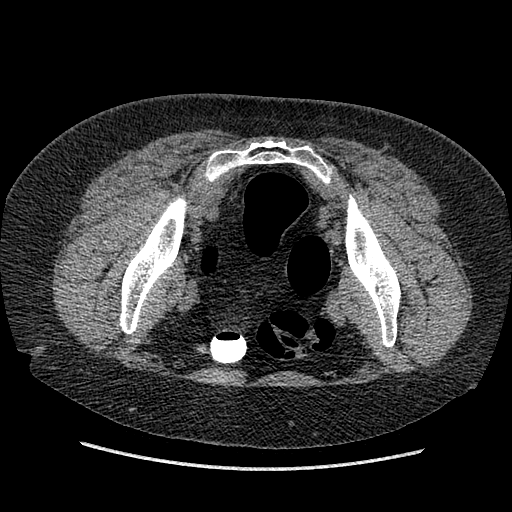
[im 103/205  soft-tissue]
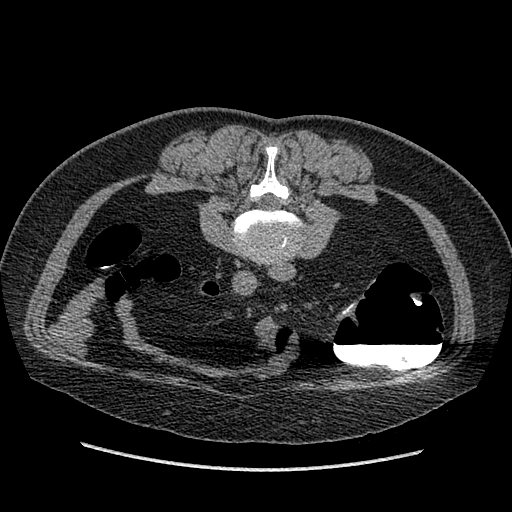
[im 154/205  soft-tissue]
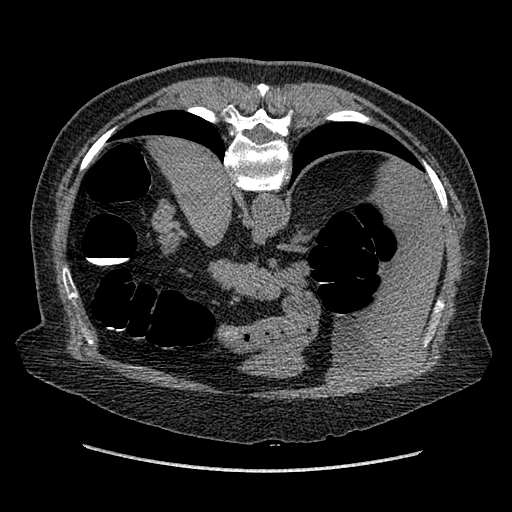

[Series 601: coronal body · coronal · 0.96mm/px · 1 of 129 slices shown, 2 images]
[im 43/129  soft-tissue]
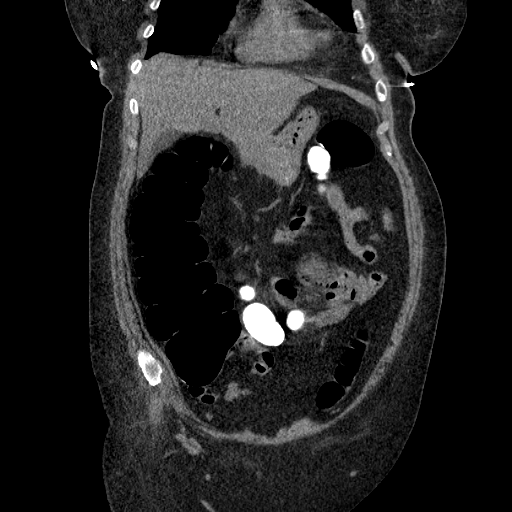
[im 43/129  bone]
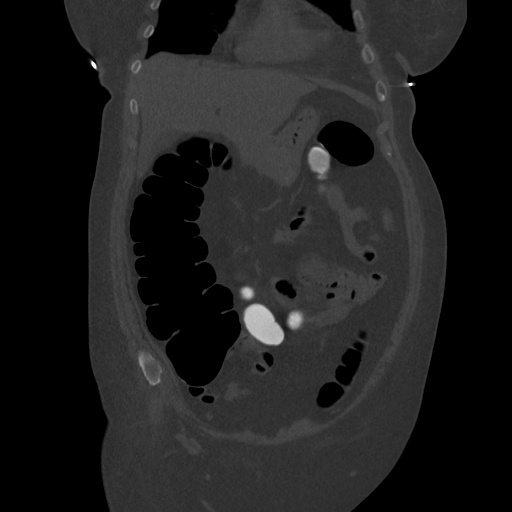

[11 of 36 positions shown; findings below may reference images not displayed]

FINDINGS: On both 2-D and 3-D images, the small amount retained
feces that is present is well tagged.  No suspicious colonic polyp
is seen.  On the supine images there is some spasm near the splenic
flexure of colon, whereas on the prone images there is spasm at the
junction of the distal descending and sigmoid colon. Both of these
areas all well seen however on the alternate series and appear
normal.  This study is therefore considered C-Rads category 1,
i.e. normal colon, continue routine screening.
IMPRESSION: No clinically suspicious colonic polyp is seen.  This study is
considered C-Rads category 1, i.e. normal colon, continue routine
screening.

Virtual colonoscopy is not designed to detect diminutive polyps
(i.e., less than or equal to 5 mm), the presence or absence of
which may not affect clinical management

CT ABDOMEN AND PELVIS WITHOUT CONTRAST

CT ABDOMEN
FINDINGS: On scans through the lung bases there is only a tiny 3 mm
noncalcified nodule in the left lower lobe on image #8.  If the
patient is considered high risk an additional CT of the chest in 1
year is recommended.  There is mild cardiomegaly present.  A
moderate sized hiatal hernia is noted.  The liver appears normal in
the unenhanced state.  No calcified gallstones are seen.  The
pancreas is normal in size and the pancreatic duct is not dilated.
The adrenal glands and spleen are grossly normal on this unenhanced
study.  Scanning through the kidneys, no renal calculi are seen and
no hydronephrosis is noted.  A low attenuation structure emanates
from the medial right kidney most consistent with a small cyst.
The abdominal aorta is normal in caliber.  No adenopathy is seen.
IMPRESSION: 1.  No significant abnormality on unenhanced CT of the abdomen.
2.  Moderate sized hiatal hernia.
3.  3 mm noncalcified nodule in the left lower lobe.  See above for
follow-up.

CT PELVIS
FINDINGS: The urinary bladder is decompressed and cannot be
evaluated.  The patient has previously undergone hysterectomy.
There are low attenuation adnexal structures bilaterally most
consistent with ovarian cysts with the larger on the right of 3.3 x
2.7 cm.  No free fluid is seen.  If further assessment is warranted
pelvic ultrasound would be recommended.  The terminal ileum appears
normal.  Degenerative disc disease is present at L5 with
degenerative change involving the facet joints of the lower lumbar
spine as well.
IMPRESSION: 1.  No significant abnormality on unenhanced CT of the pelvis.
2.  Probable bilateral ovarian cysts with the larger on the right
of 3.3 x 2.7 cm.  If further assessment is warranted, consider
pelvic ultrasound.

## 2011-08-30 ENCOUNTER — Other Ambulatory Visit: Payer: Self-pay | Admitting: Radiology

## 2012-04-13 ENCOUNTER — Ambulatory Visit: Payer: Self-pay | Admitting: Cardiovascular Disease

## 2012-04-13 DIAGNOSIS — Z7901 Long term (current) use of anticoagulants: Secondary | ICD-10-CM | POA: Insufficient documentation

## 2012-04-13 DIAGNOSIS — Z952 Presence of prosthetic heart valve: Secondary | ICD-10-CM | POA: Insufficient documentation

## 2012-07-05 ENCOUNTER — Ambulatory Visit (INDEPENDENT_AMBULATORY_CARE_PROVIDER_SITE_OTHER): Payer: Medicare Other | Admitting: Cardiovascular Disease

## 2012-07-05 ENCOUNTER — Encounter: Payer: Self-pay | Admitting: Cardiovascular Disease

## 2012-07-05 ENCOUNTER — Ambulatory Visit (INDEPENDENT_AMBULATORY_CARE_PROVIDER_SITE_OTHER): Payer: Medicare Other | Admitting: Pharmacist Clinician (PhC)/ Clinical Pharmacy Specialist

## 2012-07-05 VITALS — BP 126/64 | HR 77 | Ht 66.5 in | Wt 198.0 lb

## 2012-07-05 DIAGNOSIS — Z954 Presence of other heart-valve replacement: Secondary | ICD-10-CM

## 2012-07-05 DIAGNOSIS — Z7901 Long term (current) use of anticoagulants: Secondary | ICD-10-CM

## 2012-07-05 DIAGNOSIS — E785 Hyperlipidemia, unspecified: Secondary | ICD-10-CM

## 2012-07-05 DIAGNOSIS — I2510 Atherosclerotic heart disease of native coronary artery without angina pectoris: Secondary | ICD-10-CM

## 2012-07-05 DIAGNOSIS — Z952 Presence of prosthetic heart valve: Secondary | ICD-10-CM

## 2012-07-05 NOTE — Patient Instructions (Addendum)
Your physician recommends that you schedule a follow-up appointment in: 1 year. We ill schedule an echo just before that visit. Your physician discussed the importance of taking an antibiotic prior to any dental, gastrointestinal, genitourinary procedures to prevent damage to the heart valves from infection. You will be given a prescription for an antibiotic based on current SBE prophylaxis guidelines.

## 2012-07-09 ENCOUNTER — Observation Stay (HOSPITAL_COMMUNITY)
Admission: EM | Admit: 2012-07-09 | Discharge: 2012-07-11 | Disposition: A | Discharge status: Home / Self Care | Payer: Medicare Other | Attending: Cardiovascular Disease | Admitting: Cardiovascular Disease

## 2012-07-09 ENCOUNTER — Encounter (HOSPITAL_COMMUNITY): Payer: Self-pay | Admitting: Emergency Medicine

## 2012-07-09 DIAGNOSIS — I498 Other specified cardiac arrhythmias: Secondary | ICD-10-CM

## 2012-07-09 DIAGNOSIS — R112 Nausea with vomiting, unspecified: Secondary | ICD-10-CM | POA: Insufficient documentation

## 2012-07-09 DIAGNOSIS — I2510 Atherosclerotic heart disease of native coronary artery without angina pectoris: Secondary | ICD-10-CM | POA: Insufficient documentation

## 2012-07-09 DIAGNOSIS — R42 Dizziness and giddiness: Secondary | ICD-10-CM | POA: Insufficient documentation

## 2012-07-09 DIAGNOSIS — I440 Atrioventricular block, first degree: Secondary | ICD-10-CM | POA: Insufficient documentation

## 2012-07-09 DIAGNOSIS — Z954 Presence of other heart-valve replacement: Secondary | ICD-10-CM | POA: Insufficient documentation

## 2012-07-09 DIAGNOSIS — Z952 Presence of prosthetic heart valve: Secondary | ICD-10-CM

## 2012-07-09 DIAGNOSIS — Z79899 Other long term (current) drug therapy: Secondary | ICD-10-CM | POA: Insufficient documentation

## 2012-07-09 DIAGNOSIS — R001 Bradycardia, unspecified: Secondary | ICD-10-CM | POA: Diagnosis present

## 2012-07-09 DIAGNOSIS — Z7901 Long term (current) use of anticoagulants: Secondary | ICD-10-CM

## 2012-07-09 DIAGNOSIS — E876 Hypokalemia: Secondary | ICD-10-CM | POA: Diagnosis present

## 2012-07-09 DIAGNOSIS — I10 Essential (primary) hypertension: Secondary | ICD-10-CM | POA: Diagnosis present

## 2012-07-09 LAB — COMPREHENSIVE METABOLIC PANEL
Alkaline Phosphatase: 71 U/L (ref 39–117)
BUN: 14 mg/dL (ref 6–23)
Creatinine, Ser: 0.64 mg/dL (ref 0.50–1.10)
GFR calc Af Amer: >90 mL/min (ref >90)
Glucose, Bld: 136 mg/dL — ABNORMAL HIGH (ref 70–99)
Potassium: 2.7 mEq/L — CL (ref 3.5–5.1)
Total Bilirubin: 0.3 mg/dL (ref 0.3–1.2)
Total Protein: 6.6 g/dL (ref 6.0–8.3)

## 2012-07-09 LAB — POCT I-STAT TROPONIN I

## 2012-07-09 LAB — CBC WITH DIFFERENTIAL/PLATELET
Eosinophils Absolute: 0.1 10*3/uL (ref 0.0–0.7)
HCT: 36 % (ref 36.0–46.0)
Hemoglobin: 12.8 g/dL (ref 12.0–15.0)
Lymphs Abs: 1.5 10*3/uL (ref 0.7–4.0)
MCH: 31.4 pg (ref 26.0–34.0)
MCV: 88.5 fL (ref 78.0–100.0)
Monocytes Absolute: 0.6 10*3/uL (ref 0.1–1.0)
Monocytes Relative: 7 % (ref 3–12)
Neutrophils Relative %: 72 % (ref 43–77)
RBC: 4.07 MIL/uL (ref 3.87–5.11)

## 2012-07-09 LAB — LIPASE, BLOOD: Lipase: 41 U/L (ref 11–59)

## 2012-07-09 LAB — PROTIME-INR: Prothrombin Time: 28.2 seconds — ABNORMAL HIGH (ref 11.6–15.2)

## 2012-07-09 MED ORDER — SODIUM CHLORIDE 0.9 % IV SOLN: INTRAVENOUS | Status: DC

## 2012-07-09 MED ORDER — AMLODIPINE BESYLATE 5 MG PO TABS
5.0000 mg | ORAL_TABLET | Freq: Every day | ORAL | Status: DC
  Administered 2012-07-10 – 2012-07-11 (×2): 5 mg via ORAL
  Filled 2012-07-09 (×2): qty 1

## 2012-07-09 MED ORDER — CALCIUM CARBONATE 1250 (500 CA) MG PO TABS
1.0000 | ORAL_TABLET | Freq: Two times a day (BID) | ORAL | Status: DC
  Administered 2012-07-10 – 2012-07-11 (×3): 500 mg via ORAL
  Filled 2012-07-09 (×5): qty 1

## 2012-07-09 MED ORDER — CALCIUM CARBONATE 600 MG PO TABS
600.0000 mg | ORAL_TABLET | Freq: Two times a day (BID) | ORAL | Status: DC
  Filled 2012-07-09: qty 1

## 2012-07-09 MED ORDER — PANTOPRAZOLE SODIUM 40 MG PO TBEC
40.0000 mg | DELAYED_RELEASE_TABLET | Freq: Every day | ORAL | Status: DC
  Administered 2012-07-10 – 2012-07-11 (×2): 40 mg via ORAL
  Filled 2012-07-09 (×2): qty 1

## 2012-07-09 MED ORDER — NON FORMULARY: 5.0000 mg | Freq: Every day | Status: DC

## 2012-07-09 MED ORDER — SODIUM CHLORIDE 0.9 % IJ SOLN
3.0000 mL | Freq: Two times a day (BID) | INTRAMUSCULAR | Status: DC
  Administered 2012-07-10 – 2012-07-11 (×3): 3 mL via INTRAVENOUS

## 2012-07-09 MED ORDER — LOSARTAN POTASSIUM 50 MG PO TABS
100.0000 mg | ORAL_TABLET | Freq: Every day | ORAL | Status: DC
  Administered 2012-07-10 – 2012-07-11 (×2): 100 mg via ORAL
  Filled 2012-07-09 (×2): qty 2

## 2012-07-09 MED ORDER — DOCUSATE SODIUM 100 MG PO CAPS
100.0000 mg | ORAL_CAPSULE | Freq: Two times a day (BID) | ORAL | Status: DC | PRN
  Filled 2012-07-09: qty 1

## 2012-07-09 MED ORDER — ADULT MULTIVITAMIN W/MINERALS CH
1.0000 | ORAL_TABLET | Freq: Every day | ORAL | Status: DC
  Administered 2012-07-10 – 2012-07-11 (×2): 1 via ORAL
  Filled 2012-07-09 (×2): qty 1

## 2012-07-09 MED ORDER — ROSUVASTATIN CALCIUM 5 MG PO TABS
5.0000 mg | ORAL_TABLET | Freq: Every day | ORAL | Status: DC
  Administered 2012-07-10: 5 mg via ORAL
  Filled 2012-07-09 (×2): qty 1

## 2012-07-09 MED ORDER — POTASSIUM CHLORIDE 10 MEQ/100ML IV SOLN
10.0000 meq | INTRAVENOUS | Status: AC
  Administered 2012-07-09 (×2): 10 meq via INTRAVENOUS
  Filled 2012-07-09 (×3): qty 100

## 2012-07-09 MED ORDER — ASPIRIN EC 81 MG PO TBEC
81.0000 mg | DELAYED_RELEASE_TABLET | Freq: Every day | ORAL | Status: DC
  Administered 2012-07-10 – 2012-07-11 (×2): 81 mg via ORAL
  Filled 2012-07-09 (×2): qty 1

## 2012-07-09 MED ORDER — SODIUM CHLORIDE 0.9 % IV BOLUS (SEPSIS)
1000.0000 mL | Freq: Once | INTRAVENOUS | Status: AC
  Administered 2012-07-09: 1000 mL via INTRAVENOUS

## 2012-07-09 MED ORDER — POTASSIUM CHLORIDE CRYS ER 20 MEQ PO TBCR
40.0000 meq | EXTENDED_RELEASE_TABLET | Freq: Once | ORAL | Status: AC
  Administered 2012-07-09: 40 meq via ORAL
  Filled 2012-07-09: qty 2

## 2012-07-09 MED ORDER — ONDANSETRON HCL 4 MG/2ML IJ SOLN
4.0000 mg | Freq: Once | INTRAMUSCULAR | Status: AC
  Administered 2012-07-09: 4 mg via INTRAVENOUS
  Filled 2012-07-09: qty 2

## 2012-07-09 NOTE — H&P (Addendum)
Sharon Rose is an 72 y.o. female.   Chief Complaint: Weakness, nausea, vomiting HPI: 72 yo woman with PMH of hypertension, aortic valve replacement in 2006 with mechanical mitral valve on chronic anticoagulation, minimal CAD, post-operative atrial fibrillation (resolved) who had fairly acute onset of weakness while at church this evening starting at 17:40. She tells me she had weakness with some associated nausea. She also noted some warm sensations and dizziness. She went to the bathroom to wash her hands/face and continued to feel nauseated and dizzy leading her to present to the ER. She had no associated chest pain, shortness of breath or frank syncope. She vomited once before arriving, had her labs drawn and had some more nausea and a bit more vomiting. 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 She and her husband will walk 35-45 minutes briskly (upwards of 15 minute/mile pace) several times a week. She has no PND/orthopnea. No chest pain at home. She has had no diarrhea and no change in medications and no fever/chills or sick contacts. She hasn't missed any medications. In the ER she was noted to have HRs as low as 30s on ECG. Potassium/BMP resulted after my evaluation.    Past Medical History  Diagnosis Date  . H/O aortic valve replacement     2D ECHO, 04/26/2011 - EF 50-55%, normal-mild  . CAD (coronary artery disease)     NUCLEAR STRESS TEST, 10/07/2008 - no ischemia  . Dyspnea on exertion   . Hypertension     Past Surgical History  Procedure Laterality Date  . Cardiac catheterization Left 03/25/2009    No recommendations-normal findings  . Cardiac catheterization  01/20/2005    Medical management  . Cardiac catheterization  08/11/2004    Elective aortic valve replacement, severe aortic valvre stenosis with functional class II to III symptomatology  . Aortic valve  replacement  09/12/2004    St. Jude mechanical aortic valve, model #23AK-501, serial [id number]   No known family history heart block or electrolyte abnormalities  History reviewed. No pertinent family history. Social History:  reports that she has never smoked. She has never used smokeless tobacco. She reports that she does not drink alcohol or use illicit drugs.  Allergies:  Allergies  Allergen Reactions  . Codeine Nausea And Vomiting and Other (See Comments)    GI upset, dizziness  . Statins Other (See Comments)    Joint and muscle pain  . Sulfa Antibiotics Nausea And Vomiting and Other (See Comments)    Gi upset, dizziness   . Verapamil Other (See Comments)    Constipation     (Not in a hospital admission) Current Facility-Administered Medications  Medication Dose Route Frequency Provider Last Rate Last Dose  . potassium chloride 10 mEq in 100 mL IVPB  10 mEq Intravenous Q1 Hr x 3 Richardean Canal, MD 100 mL/hr at 07/09/12 2129 10 mEq at 07/09/12 2129   Current Outpatient Prescriptions  Medication Sig Dispense Refill  . amLODipine (NORVASC) 5 MG tablet Take 5 mg by mouth daily.      Marland Kitchen aspirin EC 81 MG tablet Take 81 mg by mouth daily.      . calcium carbonate (OS-CAL) 600 MG TABS Take 600 mg by mouth 2 (two) times daily with a meal.      . Docusate Calcium (STOOL SOFTENER PO) Take 1 capsule by mouth every evening.       Marland Kitchen  losartan-hydrochlorothiazide (HYZAAR) 100-25 MG per tablet Take 1 tablet by mouth daily.      . Multiple Vitamin (MULTIVITAMIN) tablet Take 1 tablet by mouth daily.      Marland Kitchen omeprazole (PRILOSEC) 20 MG capsule Take 20 mg by mouth daily.      . rosuvastatin (CRESTOR) 10 MG tablet Take 5 mg by mouth daily.      Marland Kitchen warfarin (COUMADIN) 5 MG tablet Take 5-7.5 mg by mouth daily. M,W,F, Sat 7.5mg  All other days, 5mg         Results for orders placed during the hospital encounter of 07/09/12 (from the past 48 hour(s))  POCT I-STAT TROPONIN I     Status: None   Collection  Time    07/09/12  7:23 PM      Result Value Range   Troponin i, poc 0.00  0.00 - 0.08 ng/mL   Comment 3            Comment: Due to the release kinetics of cTnI,     a negative result within the first hours     of the onset of symptoms does not rule out     myocardial infarction with certainty.     If myocardial infarction is still suspected,     repeat the test at appropriate intervals.  CBC WITH DIFFERENTIAL     Status: None   Collection Time    07/09/12  7:52 PM      Result Value Range   WBC 7.9  4.0 - 10.5 K/uL   RBC 4.07  3.87 - 5.11 MIL/uL   Hemoglobin 12.8  12.0 - 15.0 g/dL   HCT 40.9  81.1 - 91.4 %   MCV 88.5  78.0 - 100.0 fL   MCH 31.4  26.0 - 34.0 pg   MCHC 35.6  30.0 - 36.0 g/dL   RDW 78.2  95.6 - 21.3 %   Platelets 237  150 - 400 K/uL   Neutrophils Relative % 72  43 - 77 %   Neutro Abs 5.7  1.7 - 7.7 K/uL   Lymphocytes Relative 19  12 - 46 %   Lymphs Abs 1.5  0.7 - 4.0 K/uL   Monocytes Relative 7  3 - 12 %   Monocytes Absolute 0.6  0.1 - 1.0 K/uL   Eosinophils Relative 1  0 - 5 %   Eosinophils Absolute 0.1  0.0 - 0.7 K/uL   Basophils Relative 0  0 - 1 %   Basophils Absolute 0.0  0.0 - 0.1 K/uL  COMPREHENSIVE METABOLIC PANEL     Status: Abnormal   Collection Time    07/09/12  7:52 PM      Result Value Range   Sodium 140  135 - 145 mEq/L   Potassium 2.7 (*) 3.5 - 5.1 mEq/L   Comment: CRITICAL RESULT CALLED TO, READ BACK BY AND VERIFIED WITH:     POULOSE E,RN 07/09/12 2108 WAYK   Chloride 104  96 - 112 mEq/L   CO2 24  19 - 32 mEq/L   Glucose, Bld 136 (*) 70 - 99 mg/dL   BUN 14  6 - 23 mg/dL   Creatinine, Ser 0.86  0.50 - 1.10 mg/dL   Calcium 9.1  8.4 - 57.8 mg/dL   Total Protein 6.6  6.0 - 8.3 g/dL   Albumin 3.7  3.5 - 5.2 g/dL   AST 23  0 - 37 U/L   ALT 12  0 - 35 U/L  Alkaline Phosphatase 71  39 - 117 U/L   Total Bilirubin 0.3  0.3 - 1.2 mg/dL   GFR calc non Af Amer 88 (*) >90 mL/min   GFR calc Af Amer >90  >90 mL/min   Comment:            The eGFR  has been calculated     using the CKD EPI equation.     This calculation has not been     validated in all clinical     situations.     eGFR's persistently     <90 mL/min signify     possible Chronic Kidney Disease.  LIPASE, BLOOD     Status: None   Collection Time    07/09/12  7:52 PM      Result Value Range   Lipase 41  11 - 59 U/L  PROTIME-INR     Status: Abnormal   Collection Time    07/09/12  7:52 PM      Result Value Range   Prothrombin Time 28.2 (*) 11.6 - 15.2 seconds   INR 2.82 (*) 0.00 - 1.49   No results found.  Review of Systems  Constitutional: Positive for malaise/fatigue. Negative for fever, chills and weight loss.  HENT: Negative for ear pain, neck pain and tinnitus.   Eyes: Negative for double vision, photophobia and pain.  Respiratory: Negative for cough, hemoptysis and sputum production.   Cardiovascular: Negative for chest pain, palpitations, leg swelling and PND.  Gastrointestinal: Positive for nausea and vomiting. Negative for heartburn, abdominal pain, diarrhea and blood in stool.  Genitourinary: Positive for dysuria and hematuria. Negative for urgency.  Musculoskeletal: Negative for back pain.  Skin: Negative for itching and rash.  Neurological: Positive for dizziness and weakness. Negative for tingling, tremors, sensory change and headaches.  Endo/Heme/Allergies: Negative for polydipsia. Bruises/bleeds easily.  Psychiatric/Behavioral: Negative for depression, suicidal ideas and substance abuse.    Blood pressure 127/72, pulse 43, temperature 97.3 F (36.3 C), temperature source Oral, resp. rate 10, SpO2 100.00%. Physical Exam  Nursing note and vitals reviewed. Constitutional: She is oriented to person, place, and time. She appears well-developed and well-nourished. No distress.  HENT:  Head: Normocephalic and atraumatic.  Nose: Nose normal.  Mouth/Throat: Oropharynx is clear and moist. No oropharyngeal exudate.  Eyes: Conjunctivae and EOM are  normal. Pupils are equal, round, and reactive to light. No scleral icterus.  Neck: Normal range of motion. Neck supple. No JVD present. No tracheal deviation present. No thyromegaly present.  JVP 1 cm above clavicle at 90 degrees  Cardiovascular: Intact distal pulses.  Exam reveals no gallop.   No murmur heard. Mechanical heart sounds, irregular  Respiratory: Effort normal and breath sounds normal. No respiratory distress. She has no wheezes.  GI: Soft. Bowel sounds are normal. She exhibits no distension. There is no tenderness. There is no rebound.  Musculoskeletal: Normal range of motion. She exhibits no edema and no tenderness.  Neurological: She is alert and oriented to person, place, and time. No cranial nerve deficit. Coordination normal.  Skin: Skin is warm and dry. No rash noted. She is not diaphoretic. No erythema.  Psychiatric: She has a normal mood and affect. Her behavior is normal. Judgment and thought content normal.  ECG reviewed; sinus bradycardia; 1st degree AV block, PACs with block and/or short pauses ~ 2-3 seconds  Labs reviewed; h/h 12.8/36, plt 237, wbc 7.9, na 140, K 2.7, bun/cr 14/0.64 INR 2.8 Echo from 12/06 reviewed; EF 50-55%, mild hypo  inf/post wall; noted mechanical AV   Problem List Symptomatic bradycardia Hypokalemia Nausea/Vomiting Weakness S/p AVR with Mechanical AV Chronic Anticoagulation with warfarin Post operative atrial fibrillation in 2006 Hypertension Minimal CAD  Assessment/Plan 72 yo woman with hypertension, prior AVR with mechanical AV on chronic warfarin, minimal CAD presenting with weakness, nausea/vomiting, found to have bradycardia as low as 30s, currently 60s presumed symptomatic bradycardia but found to have hypokalemia. Differential diagnosis for symptomatic bradycardia is medications, sinus node dysfunction, ischemia, post-surgical, calcification/conduction disease progression, medical etiologies and other less common  triggers/stressors. I favor a diagnosis of hypokalemia. Differential for hypokalemia is nausea/vomiting, diarrhea or renal tubular losses. She's had some nausea/vomiting but just once before lab draw and no diarrhea. - telemetry, close monitoring - replete potassium aggressively - recheck BMP closely, urinalysis, NTproBNP - continue home anti-hypertensions as bp allows  - urine electrolytes - consider repeat Echo (done last year but I cannot see) - will hold home warfarin dosing of 7.5 mg M/W/F/Sat; 5 mg Tues/Thurs/Sun x 1 night as INr 2.8 and mechanical AVR - will need to restart warfarin tomorrow or heparin when INR < 2 - NPO after MN  - holding HCTZ Venicia Vandall 07/09/2012, 9:49 PM

## 2012-07-09 NOTE — ED Notes (Signed)
Per pt and RN-Tiffany, the decision was made to abort the standing portion of the [facility].

## 2012-07-09 NOTE — ED Provider Notes (Addendum)
History     CSN: [id number]  Arrival date & time 07/09/12  4098   First MD Initiated Contact with Patient 07/09/12 1839      Chief Complaint  Patient presents with  . Weakness  . Nausea  . Dizziness    (Consider location/radiation/quality/duration/timing/severity/associated sxs/prior treatment) The history is provided by the patient.  Sharon Rose is a 72 y.o. female history of aortic valve replacement, CAD with no stents, hypertension here presenting with weakness and dizziness and hot flashes. She was at church and was washing her hands and then subsequently felt lightheaded and dizzy and nauseous. She felt like passing out but didn't pass out. Denies chest pain or shortness of breath. No cardiac stents but does have history of CAD. On coumadin for aortic valve replacement.    Past Medical History  Diagnosis Date  . H/O aortic valve replacement     2D ECHO, 04/26/2011 - EF 50-55%, normal-mild  . CAD (coronary artery disease)     NUCLEAR STRESS TEST, 10/07/2008 - no ischemia  . Dyspnea on exertion   . Hypertension     Past Surgical History  Procedure Laterality Date  . Cardiac catheterization Left 03/25/2009    No recommendations-normal findings  . Cardiac catheterization  01/20/2005    Medical management  . Cardiac catheterization  08/11/2004    Elective aortic valve replacement, severe aortic valvre stenosis with functional class II to III symptomatology  . Aortic valve replacement  09/12/2004    [employer] mechanical aortic valve, model #23AK-501, serial [id number]    History reviewed. No pertinent family history.  History  Substance Use Topics  . Smoking status: Never Smoker   . Smokeless tobacco: Never Used  . Alcohol Use: No    OB History   Grav Para Term Preterm Abortions TAB SAB Ect Mult Living                  Review of Systems  Gastrointestinal: Positive for nausea.  Neurological: Positive for weakness.  All other systems reviewed and are  negative.    Allergies  Codeine; Statins; Sulfa antibiotics; and Verapamil  Home Medications   Current Outpatient Rx  Name  Route  Sig  Dispense  Refill  . amLODipine (NORVASC) 5 MG tablet   Oral   Take 5 mg by mouth daily.         Marland Kitchen aspirin EC 81 MG tablet   Oral   Take 81 mg by mouth daily.         . calcium carbonate (OS-CAL) 600 MG TABS   Oral   Take 600 mg by mouth 2 (two) times daily with a meal.         . Docusate Calcium (STOOL SOFTENER PO)   Oral   Take 1 capsule by mouth every evening.          Marland Kitchen losartan-hydrochlorothiazide (HYZAAR) 100-25 MG per tablet   Oral   Take 1 tablet by mouth daily.         . Multiple Vitamin (MULTIVITAMIN) tablet   Oral   Take 1 tablet by mouth daily.         Marland Kitchen omeprazole (PRILOSEC) 20 MG capsule   Oral   Take 20 mg by mouth daily.         . rosuvastatin (CRESTOR) 10 MG tablet   Oral   Take 5 mg by mouth daily.         Marland Kitchen warfarin (COUMADIN) 5 MG tablet  Oral   Take 5-7.5 mg by mouth daily. M,W,F, Sat 7.5mg  All other days, 5mg            BP 127/72  Pulse 43  Temp(Src) 97.3 F (36.3 C) (Oral)  Resp 10  SpO2 100%  Physical Exam  Nursing note and vitals reviewed. Constitutional: She is oriented to person, place, and time. She appears well-nourished.  Tired, sweating   HENT:  Head: Normocephalic.  Mouth/Throat: Oropharynx is clear and moist.  Eyes: Conjunctivae are normal. Pupils are equal, round, and reactive to light.  No nystagmus   Neck: Normal range of motion. Neck supple.  Cardiovascular: Regular rhythm and normal heart sounds.   + mechanical click   Pulmonary/Chest: Effort normal and breath sounds normal. No respiratory distress. She has no wheezes. She has no rales.  Abdominal: Soft. Bowel sounds are normal. She exhibits no distension. There is no tenderness. There is no rebound.  Musculoskeletal: Normal range of motion. She exhibits no edema and no tenderness.  Neurological: She is  alert and oriented to person, place, and time.  Nl strength and sensation   Skin: Skin is warm and dry.  Psychiatric: She has a normal mood and affect. Her behavior is normal. Judgment and thought content normal.    ED Course  Procedures (including critical care time)  Labs Reviewed  COMPREHENSIVE METABOLIC PANEL - Abnormal; Notable for the following:    Potassium 2.7 (*)    Glucose, Bld 136 (*)    GFR calc non Af Amer 88 (*)    All other components within normal limits  PROTIME-INR - Abnormal; Notable for the following:    Prothrombin Time 28.2 (*)    INR 2.82 (*)    All other components within normal limits  CBC WITH DIFFERENTIAL  LIPASE, BLOOD  POCT I-STAT TROPONIN I   No results found.   No diagnosis found.   Date: 07/09/2012- 1   Rate: 64  Rhythm: sinus arrhythmia  QRS Axis: left  Intervals: PR prolonged  ST/T Wave abnormalities: nonspecific ST changes  Conduction Disutrbances:first-degree A-V block   Narrative Interpretation: sinus arrhythmia new   Old EKG Reviewed: changes noted   Date: 07/09/2012- 2   Rate: 44  Rhythm: sinus bradycardia  QRS Axis: left  Intervals: normal  ST/T Wave abnormalities: nonspecific ST changes  Conduction Disutrbances:first-degree A-V block   Narrative Interpretation: back to sinus bradycardia   Old EKG Reviewed: unchanged     MDM  Sharon Rose is a 72 y.o. female here with weakness, nausea. Will need to r/o ACS. Will get trop x 2. Will get labs and reassess.   9:16 PM HR decreased to 30s periodically. I reviewed the rhythm strip and it showed 1st degree AV block. I called Dr. Tresa Endo from [facility] who will observe her overnight. K 2.7, supplemented.        Richardean Canal, MD 07/09/12 2117  Richardean Canal, MD 07/09/12 [phone]

## 2012-07-09 NOTE — ED Notes (Signed)
Pt c/o generalized weakness, n/v, dizziness, and warm to touch that started around 1740. Pt has cardiac hx had a heart valve replacement, MI in 2006, irregular heart rate, and HTN. Pt was given 324 ASA and refused nitro. Pt alert and oriented. Vitals 143/103, 97% RA.

## 2012-07-09 NOTE — ED Notes (Signed)
Pt states she was feeling ok, she was at church and began not feeling well. Pt c/o dizziness, nausea and vomiting. Pt denies any sob, or cp. Pt alert and oriented. Diaphoretic and warm to touch.

## 2012-07-10 DIAGNOSIS — I498 Other specified cardiac arrhythmias: Principal | ICD-10-CM

## 2012-07-10 DIAGNOSIS — E876 Hypokalemia: Secondary | ICD-10-CM

## 2012-07-10 DIAGNOSIS — Z954 Presence of other heart-valve replacement: Secondary | ICD-10-CM

## 2012-07-10 LAB — BASIC METABOLIC PANEL WITH GFR
BUN: 13 mg/dL (ref 6–23)
CO2: 29 meq/L (ref 19–32)
Calcium: 9.1 mg/dL (ref 8.4–10.5)
Chloride: 103 meq/L (ref 96–112)
Creatinine, Ser: 0.73 mg/dL (ref 0.50–1.10)
GFR calc Af Amer: >90 mL/min
GFR calc non Af Amer: 85 mL/min — ABNORMAL LOW
Glucose, Bld: 134 mg/dL — ABNORMAL HIGH (ref 70–99)
Potassium: 3.4 meq/L — ABNORMAL LOW (ref 3.5–5.1)
Sodium: 140 meq/L (ref 135–145)

## 2012-07-10 LAB — BASIC METABOLIC PANEL
BUN: 11 mg/dL (ref 6–23)
CO2: 28 mEq/L (ref 19–32)
Calcium: 8.6 mg/dL (ref 8.4–10.5)
Creatinine, Ser: 0.56 mg/dL (ref 0.50–1.10)
Glucose, Bld: 137 mg/dL — ABNORMAL HIGH (ref 70–99)

## 2012-07-10 LAB — URINALYSIS, ROUTINE W REFLEX MICROSCOPIC
Bilirubin Urine: NEGATIVE
Glucose, UA: NEGATIVE mg/dL
Ketones, ur: NEGATIVE mg/dL
Protein, ur: NEGATIVE mg/dL

## 2012-07-10 LAB — PROTIME-INR
INR: 2.19 — ABNORMAL HIGH (ref 0.00–1.49)
Prothrombin Time: 25 seconds — ABNORMAL HIGH (ref 11.6–15.2)

## 2012-07-10 LAB — PRO B NATRIURETIC PEPTIDE: Pro B Natriuretic peptide (BNP): 268.9 pg/mL — ABNORMAL HIGH (ref 0–125)

## 2012-07-10 LAB — NA AND K (SODIUM & POTASSIUM), RAND UR: Potassium Urine: 40 mEq/L

## 2012-07-10 LAB — TROPONIN I
Troponin I: <0.3 ng/mL
Troponin I: <0.3 ng/mL

## 2012-07-10 LAB — CREATININE, URINE, RANDOM: Creatinine, Urine: 18.92 mg/dL

## 2012-07-10 LAB — OSMOLALITY: Osmolality: 287 mOsm/kg (ref 275–300)

## 2012-07-10 LAB — URINE MICROSCOPIC-ADD ON

## 2012-07-10 LAB — PHOSPHORUS: Phosphorus: 3.1 mg/dL (ref 2.3–4.6)

## 2012-07-10 MED ORDER — CIPROFLOXACIN HCL 250 MG PO TABS
250.0000 mg | ORAL_TABLET | Freq: Two times a day (BID) | ORAL | Status: DC
  Administered 2012-07-10 – 2012-07-11 (×2): 250 mg via ORAL
  Filled 2012-07-10 (×4): qty 1

## 2012-07-10 MED ORDER — POTASSIUM CHLORIDE 10 MEQ/100ML IV SOLN
10.0000 meq | Freq: Once | INTRAVENOUS | Status: AC
  Administered 2012-07-10: 10 meq via INTRAVENOUS
  Filled 2012-07-10: qty 100

## 2012-07-10 MED ORDER — WARFARIN SODIUM 10 MG PO TABS
10.0000 mg | ORAL_TABLET | Freq: Once | ORAL | Status: AC
  Administered 2012-07-10: 10 mg via ORAL
  Filled 2012-07-10: qty 1

## 2012-07-10 MED ORDER — WARFARIN - PHARMACIST DOSING INPATIENT: Freq: Every day | Status: DC

## 2012-07-10 NOTE — Progress Notes (Signed)
[facility]  Subjective: Feels better. No further n/v, diaphoresis or dizziness.   Objective: Vital signs in last 24 hours: Temp:  [97.3 F (36.3 C)-98 F (36.7 C)] 98 F (36.7 C) (06/16 0400) Pulse Rate:  [42-108] 73 (06/16 0400) Resp:  [9-33] 18 (06/16 0400) BP: (113-140)/(53-113) 113/56 mmHg (06/16 0400) SpO2:  [98 %-100 %] 100 % (06/16 0400) Weight:  [195 lb 9.6 oz (88.724 kg)] 195 lb 9.6 oz (88.724 kg) (06/15 2257) Last BM Date: 07/08/12  Intake/Output from previous day: 06/15 0701 - 06/16 0700 In: -  Out: 200 [Urine:200] Intake/Output this shift:    Medications Current Facility-Administered Medications  Medication Dose Route Frequency Provider Last Rate Last Dose  . amLODipine (NORVASC) tablet 5 mg  5 mg Oral Daily Leeann Must, MD      . aspirin EC tablet 81 mg  81 mg Oral Daily Leeann Must, MD      . calcium carbonate (OS-CAL - dosed in mg of elemental calcium) tablet 500 mg of elemental calcium  1 tablet Oral BID WC Mihai Croitoru, MD   500 mg of elemental calcium at 07/10/12 0743  . docusate sodium (COLACE) capsule 100 mg  100 mg Oral BID PRN Leeann Must, MD      . losartan (COZAAR) tablet 100 mg  100 mg Oral Daily Leeann Must, MD      . multivitamin with minerals tablet 1 tablet  1 tablet Oral Daily Leeann Must, MD      . pantoprazole (PROTONIX) EC tablet 40 mg  40 mg Oral Daily Leeann Must, MD      . rosuvastatin (CRESTOR) tablet 5 mg  5 mg Oral q1800 Mihai Croitoru, MD      . sodium chloride 0.9 % injection 3 mL  3 mL Intravenous Q12H Leeann Must, MD        PE: General appearance: alert, cooperative and no distress Lungs: clear to auscultation bilaterally Heart: crisp mechanical heart sounds  Extremities: no LEE Pulses: 2+ and symmetric Skin: warm and dry Neurologic: Grossly normal  Lab Results:   Recent Labs  07/09/12 1952  WBC 7.9  HGB 12.8  HCT 36.0  PLT 237   BMET  Recent Labs  07/09/12 1952 07/10/12 0153   NA 140 138  K 2.7* 3.9  CL 104 102  CO2 24 28  GLUCOSE 136* 137*  BUN 14 11  CREATININE 0.64 0.56  CALCIUM 9.1 8.6   PT/INR  Recent Labs  07/09/12 1952 07/10/12 0153  LABPROT 28.2* 25.0*  INR 2.82* 2.39*   Cardiac Panel (last 3 results)  Recent Labs  07/10/12 0158  TROPONINI <0.30    Assessment/Plan  Principal Problem:   Symptomatic bradycardia Active Problems:   Long term (current) use of anticoagulants   H/O aortic valve replacement   Hypokalemia   Hypertension  Plan: No further bradycardia on telemetry. Pt denies further nausea, vomiting and dizziness. Hypokalemia has resolved. K+ is 3.9 today. Mg is 1.7. Only one troponin was collected. The patient is female and age 72. No reported history of diabetes. Both females and elderly tend to have atypical presentations of MI. Pt reported to the ER yesterday with complaints of nausea, vomiting, weakness and profuse diaphoresis and no chest pain. Will order another troponin. Initial was checked at 0158 today. Her last nuclear stress test was in 2010 and was low risk for ischemia. Her last echo was 04/2011 and demonstrated normal systolic function. EF was 50 -55%. Her Pro  BNP was also elevated at 268.9. ? Rechecking echo to ensure no change in systolic function. If a notable change is noted may need an ischemic evaluation. ? NST.     LOS: 1 day    Brittainy M. Delmer Islam 07/10/2012 9:52 AM  I have seen and evaluated the patient this PM along with Boyce Medici, PA. I agree with her findings, examination as well as impression recommendations.  This is quite a perplexing situation. She's been relatively stable cardiac standpoint until this event occurred. Trying to piece together what the true course of events was:  Several hours after eating, was in the bathroom feeling fine and then began to feel flushed and dizzy --> walked into the church [employer] and felt as though she would pass out went to lie down on the floor  and then became nauseated and vomited.  Pressures monitored checked her blood pressures were way high in the 190s, no comment on heart rate however  Upon EMS arrival blood pressure back to normal. She then had several more episodes of emesis prior to and during transport to the hospital.  Upon arrival to the hospital she had sinus bradycardia with first 3 AV block, PACs and blocked PACs.  Admission labs showed potassium 2.7, this is most easily explained by her emesis, and probably is not been there for the cause of her feeling poorly before vomiting  Her UA shows many bacteria, culture pending. She did notice increasing urinary frequency and and discomfort, and does have a history of UTI.  To piece this all together, I think she has first-degree AV block at baseline, and then had some type of event occurred her system leading her to become nauseated. One exhalation this could be possible UTI. She then potentially other vagal response and became bradycardic. Perhaps this is a major feel dizzy. Upon her out of the emergency room she has had several episodes of emesis with easily explained her hypokalemia. Making this an unlikelyly reason for her bradycardia.  Currently she is in sinus rhythm with first-degree AV block and has PACs. Her initial ECG on admission shows sinus bradycardia with first-degree AV block and PACs.  I agree with evaluating for coronary ischemia, however she had no episodes to suggest angina. Troponins are negative to date. No ECG changes to suggest ischemia. I don't see any real reason to check an echocardiogram as his been relatively stable. One could consider possible conduction abnormality as a post aortic valve mechanical complication. The has not had any issues since her surgery. She is not on any AV nodal agents, and has not had any episodes of atrial fibrillation since shortly after her operation for her aortic valve.  Her exam is essentially benign with the exception  of a metallic aortic valve sounds and ectopy.  I think the best plan for now is to await results for urine culture in order to best guide our in about therapy. Patient as she is adequately hydrated and her potassium was repleted. I would then want to monitor at least overnight tonight the patient she has no further episodes. I would do her later today to see how well she does.  MD Time with pt: 10 min  HARDING,DAVID W, M.D., M.S. [facility] [address] [place], [place]  [address]  [phone] Pager # [phone] 07/10/2012 2:08 PM

## 2012-07-10 NOTE — Progress Notes (Signed)
Ambulated pt in hallway; pt tolerated well, no complaints

## 2012-07-10 NOTE — Progress Notes (Signed)
Utilization review completed.  

## 2012-07-10 NOTE — Progress Notes (Addendum)
ANTICOAGULATION CONSULT NOTE - Initial Consult  Pharmacy Consult for coumadin Indication: mechanical valve  Allergies  Allergen Reactions  . Codeine Nausea And Vomiting and Other (See Comments)    GI upset, dizziness  . Statins Other (See Comments)    Joint and muscle pain  . Sulfa Antibiotics Nausea And Vomiting and Other (See Comments)    Gi upset, dizziness   . Verapamil Other (See Comments)    Constipation    Patient Measurements: Height: 5\' 7"  (170.2 cm) Weight: 195 lb 9.6 oz (88.724 kg) IBW/kg (Calculated) : 61.6  Vital Signs: Temp: 98.5 F (36.9 C) (06/16 1337) Temp src: Oral (06/16 1337) BP: 123/55 mmHg (06/16 1337) Pulse Rate: 70 (06/16 1337)  Labs:  Recent Labs  07/09/12 1952 07/10/12 0153 07/10/12 0158 07/10/12 1030  HGB 12.8  --   --   --   HCT 36.0  --   --   --   PLT 237  --   --   --   LABPROT 28.2* 25.0*  --   --   INR 2.82* 2.39*  --   --   CREATININE 0.64 0.56  --   --   TROPONINI  --   --  <0.30 <0.30    Estimated Creatinine Clearance: 74.8 ml/min (by C-G formula based on Cr of 0.56).   Medical History: Past Medical History  Diagnosis Date  . H/O aortic valve replacement     2D ECHO, 04/26/2011 - EF 50-55%, normal-mild  . CAD (coronary artery disease)     NUCLEAR STRESS TEST, 10/07/2008 - no ischemia  . Dyspnea on exertion   . Hypertension     Medications:  Prescriptions prior to admission  Medication Sig Dispense Refill  . amLODipine (NORVASC) 5 MG tablet Take 5 mg by mouth daily.      Marland Kitchen aspirin EC 81 MG tablet Take 81 mg by mouth daily.      . calcium carbonate (OS-CAL) 600 MG TABS Take 600 mg by mouth 2 (two) times daily with a meal.      . Docusate Calcium (STOOL SOFTENER PO) Take 1 capsule by mouth every evening.       Marland Kitchen losartan-hydrochlorothiazide (HYZAAR) 100-25 MG per tablet Take 1 tablet by mouth daily.      . Multiple Vitamin (MULTIVITAMIN) tablet Take 1 tablet by mouth daily.      Marland Kitchen omeprazole (PRILOSEC) 20 MG capsule  Take 20 mg by mouth daily.      . rosuvastatin (CRESTOR) 10 MG tablet Take 5 mg by mouth daily.      Marland Kitchen warfarin (COUMADIN) 5 MG tablet Take 5-7.5 mg by mouth daily. M,W,F, Sat 7.5mg  All other days, 5mg        Scheduled:  . amLODipine  5 mg Oral Daily  . aspirin EC  81 mg Oral Daily  . calcium carbonate  1 tablet Oral BID WC  . ciprofloxacin  250 mg Oral BID  . losartan  100 mg Oral Daily  . multivitamin with minerals  1 tablet Oral Daily  . pantoprazole  40 mg Oral Daily  . rosuvastatin  5 mg Oral q1800  . sodium chloride  3 mL Intravenous Q12H    Assessment: 72 yo female here with bradycardia. Patient is on coumadin at home for history of AVR in 2006.  INR today is 2.39. Patient noted on cipro for UTI.  Home coumadin dose:  7.5mg  MWFSa, 5mg  SuTT (last dose 07/08/12)  Goal of Therapy:  INR 2-3 Monitor  platelets by anticoagulation protocol: Yes   Plan:  -Coumadin 10mg  tonight as dose skipped last pm -Daily PT/INR  Harland German, Pharm D 07/10/2012 5:58 PM

## 2012-07-11 DIAGNOSIS — I2510 Atherosclerotic heart disease of native coronary artery without angina pectoris: Secondary | ICD-10-CM | POA: Insufficient documentation

## 2012-07-11 DIAGNOSIS — I10 Essential (primary) hypertension: Secondary | ICD-10-CM

## 2012-07-11 DIAGNOSIS — E7800 Pure hypercholesterolemia, unspecified: Secondary | ICD-10-CM | POA: Insufficient documentation

## 2012-07-11 DIAGNOSIS — Z7901 Long term (current) use of anticoagulants: Secondary | ICD-10-CM

## 2012-07-11 DIAGNOSIS — N390 Urinary tract infection, site not specified: Secondary | ICD-10-CM

## 2012-07-11 MED ORDER — CIPROFLOXACIN HCL 250 MG PO TABS: 250.0000 mg | ORAL_TABLET | Freq: Two times a day (BID) | ORAL | Status: DC

## 2012-07-11 MED ORDER — LOSARTAN POTASSIUM 100 MG PO TABS: 100.0000 mg | ORAL_TABLET | Freq: Every day | ORAL | Status: DC

## 2012-07-11 MED ORDER — WARFARIN SODIUM 7.5 MG PO TABS
7.5000 mg | ORAL_TABLET | Freq: Once | ORAL | Status: DC
  Filled 2012-07-11: qty 1

## 2012-07-11 NOTE — Discharge Summary (Signed)
Physician Discharge Summary  Patient ID: Sharon Rose MRN: [id number] DOB/AGE: 08/23/1940 72 y.o.  Admit date: 07/09/2012 Discharge date: 07/11/2012  Admission Diagnoses:  Symptomatic Bradycardia  Discharge Diagnoses:  Principal Problem:   Symptomatic bradycardia Active Problems:   Long term (current) use of anticoagulants   H/O aortic valve replacement   Hypokalemia   Hypertension   Discharged Condition: stable  Hospital Course: The patient is a 72 y/o female, followed at [facility] by Dr. Royann Shivers, with a PMH of hypertension, aortic valve replacement in 2006 with mechanical mitral valve and minimal CAD. She is on chronic anticoagulation with warfarin. She presented to the [facility] ER on 6/16 with complaints of weakness, dizziness, nausea and vomiting. She denied chest pain and no sick contacts. In the ED, she was noted to be bradycardic with a HR in the 30's and an EKG showed first degree AV block. However, her HR stabilized into the 60's. Troponins were negative. She was also hypokalemic with a K+ of 2.7. She was admitted and placed on telemetry for close monitoring. Her hypokalemia was corrected with supplemental potassium. She had no further episodes of bradycardia and no further hypokalemia. It was felt that her vomiting was the cause for vagal induced bradycardia and that her hypokalemia was also subsequent to her vomiting. The patient had no further issues, but did note urinary frequency and urgency. A urinalysis was suggestive of a UTI. She was started on cirpo. Since she had no further bradycardia and symptoms had resolved, there was no indication for pacemaker therapy. She was last seen and examined by Dr. Royann Shivers, who felt she was stable for discharge home. She was ordered to continue her course of antibiotics for her UTI. Since there are potential drug interactions with cipro and warfarin, she was instructed to follow up at [facility] for an INR check on 6/20. She will also follow-up with Dr.  Royann Shivers on 08/10/12.  Consults: None  Significant Diagnostic Studies: None  Treatments: See Hospital Course  Discharge Exam: Blood pressure 105/55, pulse 66, temperature 97.9 F (36.6 C), temperature source Oral, resp. rate 18, height 5\' 7"  (1.702 m), weight 198 lb 1.6 oz (89.858 kg), SpO2 93.00%.   Disposition:   Discharge Orders   Future Appointments Provider Department Dept Phone   07/14/2012 10:00 AM Phillips Hay, RPH-CPP [facility] Ginette Otto [phone]   08/10/2012 9:30 AM Thurmon Fair, MD [facility] [place] [phone]   Future Orders Complete By Expires     Diet - low sodium heart healthy  As directed     Increase activity slowly  As directed         Medication List    STOP taking these medications       losartan-hydrochlorothiazide 100-25 MG per tablet  Commonly known as:  HYZAAR      TAKE these medications       amLODipine 5 MG tablet  Commonly known as:  NORVASC  Take 5 mg by mouth daily.     aspirin EC 81 MG tablet  Take 81 mg by mouth daily.     calcium carbonate 600 MG Tabs  Commonly known as:  OS-CAL  Take 600 mg by mouth 2 (two) times daily with a meal.     ciprofloxacin 250 MG tablet  Commonly known as:  CIPRO  Take 1 tablet (250 mg total) by mouth 2 (two) times daily.     losartan 100 MG tablet  Commonly known as:  COZAAR  Take  1 tablet (100 mg total) by mouth daily.     multivitamin tablet  Take 1 tablet by mouth daily.     omeprazole 20 MG capsule  Commonly known as:  PRILOSEC  Take 20 mg by mouth daily.     rosuvastatin 10 MG tablet  Commonly known as:  CRESTOR  Take 5 mg by mouth daily.     STOOL SOFTENER PO  Take 1 capsule by mouth every evening.     warfarin 5 MG tablet  Commonly known as:  COUMADIN  Take 5-7.5 mg by mouth daily. M,W,F, Sat 7.5mg   All other days, 5mg            Follow-up Information   Follow up with Thurmon Fair, MD On 08/10/2012.  (9:30 am)    Contact information:   [address] [place] [address] [phone]       Follow up with [facility] On 07/14/2012. (10:00 am INR check)    Contact information:   [phone]     TIME SPENT ON DISCHARGE, INCLUDING PHYSICIAN TIME: >30 MINUTES  Signed: Allayne Butcher, PA-C 07/11/2012, 11:00 AM

## 2012-07-11 NOTE — Progress Notes (Signed)
Patient ID: Sharon Rose, female   DOB: 1940/05/01, 72 y.o.   MRN: [id number]      Reason for office visit  aortic valve mechanical prosthesis  Sharon Rose is now 72 years old and had replacement of a severely stenotic, probably congenitally bicuspid aortic valve in 2006. She has not had any problems related to the prostatic valve since that time. She remains physically very active and walks on almost daily basis with her husband at about a 15 minute mile pace. 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 She has not had any bleeding problems. She denies any symptoms of cardioembolic events such as focal nodule deficits. She is unaware of palpitations but occasionally hears her valve click in an irregular manner. She has not had syncope chest pain or shortness of breath.  He has a dyslipidemia that is treated with a statin. She had laboratory tests performed earlier this year with her primary care physician that were reportedly within the desirable range. She has mild and well treated systemic hypertension.    Allergies  Allergen Reactions  . Codeine Nausea And Vomiting and Other (See Comments)    GI upset, dizziness  . Statins Other (See Comments)    Joint and muscle pain  . Sulfa Antibiotics Nausea And Vomiting and Other (See Comments)    Gi upset, dizziness   . Verapamil Other (See Comments)    Constipation    Current Outpatient Prescriptions  Medication Sig Dispense Refill  . amLODipine (NORVASC) 5 MG tablet Take 5 mg by mouth daily.      Marland Kitchen aspirin EC 81 MG tablet Take 81 mg by mouth daily.      . calcium carbonate (OS-CAL) 600 MG TABS Take 600 mg by mouth 2 (two) times daily with a meal.      . ciprofloxacin (CIPRO) 250 MG tablet Take 1 tablet (250 mg total) by mouth 2 (two) times daily.  4 tablet  0  . Docusate Calcium (STOOL SOFTENER PO) Take 1 capsule by mouth every evening.       Marland Kitchen losartan (COZAAR) 100 MG tablet Take  1 tablet (100 mg total) by mouth daily.  30 tablet  5  . Multiple Vitamin (MULTIVITAMIN) tablet Take 1 tablet by mouth daily.      Marland Kitchen omeprazole (PRILOSEC) 20 MG capsule Take 20 mg by mouth daily.      . rosuvastatin (CRESTOR) 10 MG tablet Take 5 mg by mouth daily.      Marland Kitchen warfarin (COUMADIN) 5 MG tablet Take 5-7.5 mg by mouth daily. M,W,F, Sat 7.5mg  All other days, 5mg        No current facility-administered medications for this visit.    Past Medical History  Diagnosis Date  . H/O aortic valve replacement     2D ECHO, 04/26/2011 - EF 50-55%, normal-mild  . CAD (coronary artery disease)     NUCLEAR STRESS TEST, 10/07/2008 - no ischemia  . Dyspnea on exertion   . Hypertension     Past Surgical History  Procedure Laterality Date  . Cardiac catheterization Left 03/25/2009    No recommendations-normal findings  . Cardiac catheterization  01/20/2005    Medical management  . Cardiac catheterization  08/11/2004    Elective aortic valve replacement, severe aortic valvre stenosis with functional class II to III symptomatology  . Aortic valve replacement  09/12/2004    St. Jude mechanical aortic valve, model #23AK-501, serial [id number]  No family history on file.  History   Social History  . Marital Status: Married    Spouse Name: N/A    Number of Children: N/A  . Years of Education: N/A   Occupational History  . Not on file.   Social History Main Topics  . Smoking status: Never Smoker   . Smokeless tobacco: Never Used  . Alcohol Use: No  . Drug Use: No  . Sexually Active: Not on file   Other Topics Concern  . Not on file   Social History Narrative  . No narrative on file    Review of systems: The patient specifically denies any chest pain at rest or with exertion, dyspnea at rest or with exertion, orthopnea, paroxysmal nocturnal dyspnea, syncope, palpitations, focal neurological deficits, intermittent claudication, lower extremity edema, unexplained weight gain, cough,  hemoptysis or wheezing.  The patient also denies abdominal pain, nausea, vomiting, dysphagia, diarrhea, constipation, polyuria, polydipsia, dysuria, hematuria, frequency, urgency, abnormal bleeding or bruising, fever, chills, unexpected weight changes, mood swings, change in skin or hair texture, change in voice quality, auditory or visual problems, allergic reactions or rashes, new musculoskeletal complaints other than usual "aches and pains".   PHYSICAL EXAM BP 126/64  Pulse 77  Ht 5' 6.5" (1.689 m)  Wt 89.812 kg (198 lb)  BMI 31.48 kg/m2  General: Alert, oriented x3, no distress, mildly obese. She appears very energetic Head: no evidence of trauma, PERRL, EOMI, no exophtalmos or lid lag, no myxedema, no xanthelasma; normal ears, nose and oropharynx Neck: normal jugular venous pulsations and no hepatojugular reflux; brisk carotid pulses without delay and no carotid bruits Chest: clear to auscultation, no signs of consolidation by percussion or palpation, normal fremitus, symmetrical and full respiratory excursions any: Well-healed sternotomy scar Cardiovascular: normal position and quality of the apical impulse, regular rhythm with occasional ectopic beats, normal first and second heart sounds and very crisp prostatic valve sounds, no diastolic murmurs, rubs or gallops; grade 2/6 early peaking systolic ejection murmur in the aortic focus Abdomen: no tenderness or distention, no masses by palpation, no abnormal pulsatility or arterial bruits, normal bowel sounds, no hepatosplenomegaly Extremities: no clubbing, cyanosis or edema; 2+ radial, ulnar and brachial pulses bilaterally; 2+ right femoral, posterior tibial and dorsalis pedis pulses; 2+ left femoral, posterior tibial and dorsalis pedis pulses; no subclavian or femoral bruits Neurological: grossly nonfocal   EKG: Sinus rhythm with first degree AV block and PACs in a pattern of trigeminy, some of them with aberrant conduction, no ischemic  repolarization changes  Lipid Panel  January 2013 total cholesterol 132, trapezoid 103, HDL 41, LDL 76  BMET    Component Value Date/Time   NA 140 07/10/2012 2105   K 3.4* 07/10/2012 2105   CL 103 07/10/2012 2105   CO2 29 07/10/2012 2105   GLUCOSE 134* 07/10/2012 2105   BUN 13 07/10/2012 2105   CREATININE 0.73 07/10/2012 2105   CALCIUM 9.1 07/10/2012 2105   GFRNONAA 85* 07/10/2012 2105   GFRAA >90 07/10/2012 2105     ASSESSMENT AND PLAN H/O aortic valve replacement Mechanical prosthesis, St. Jude 23 mm model AK501, Dr. Tyrone Sage August 2006 for critical aortic stenosis with normal function by echocardiogram April 2013 (peak gradient of 28, mean gradient 16 mm Hg) Incidental note of totally occluded second oblique marginal artery by preoperative angiography, normal left ventricular systolic function.  Hyperlipidemia Satisfactory levels at lipid profile performed in 2013 reportedly similar values this year as well.   Long term (current) use  of anticoagulants No history of stroke, transient ischemic attack or bleeding complications all treatment with warfarin  Orders Placed This Encounter  Procedures  . EKG 12-Lead  . 2D Echocardiogram with contrast   We'll plan to repeat an echocardiogram in another year or so. No changes are made her medications  Sharon Rose  Thurmon Fair, MD, [facility] [phone] office [phone] pager

## 2012-07-11 NOTE — Assessment & Plan Note (Signed)
No history of stroke, transient ischemic attack or bleeding complications all treatment with warfarin

## 2012-07-11 NOTE — Assessment & Plan Note (Signed)
Satisfactory levels at lipid profile performed in 2013 reportedly similar values this year as well.

## 2012-07-11 NOTE — Progress Notes (Signed)
ANTICOAGULATION CONSULT NOTE - Follow up consult  Pharmacy Consult for coumadin Indication: mechanical valve  Allergies  Allergen Reactions  . Codeine Nausea And Vomiting and Other (See Comments)    GI upset, dizziness  . Statins Other (See Comments)    Joint and muscle pain  . Sulfa Antibiotics Nausea And Vomiting and Other (See Comments)    Gi upset, dizziness   . Verapamil Other (See Comments)    Constipation    Patient Measurements: Height: 5\' 7"  (170.2 cm) Weight: 198 lb 1.6 oz (89.858 kg) IBW/kg (Calculated) : 61.6  Vital Signs: Temp: 97.9 F (36.6 C) (06/17 0459) Temp src: Oral (06/17 0459) BP: 105/55 mmHg (06/17 0917) Pulse Rate: 66 (06/17 0459)  Labs:  Recent Labs  07/09/12 1952 07/10/12 0153 07/10/12 0158 07/10/12 1030 07/10/12 2105  HGB 12.8  --   --   --   --   HCT 36.0  --   --   --   --   PLT 237  --   --   --   --   LABPROT 28.2* 25.0*  --   --  23.4*  INR 2.82* 2.39*  --   --  2.19*  CREATININE 0.64 0.56  --   --  0.73  TROPONINI  --   --  <0.30 <0.30  --     Estimated Creatinine Clearance: 75.3 ml/min (by C-G formula based on Cr of 0.73).   Medical History: Past Medical History  Diagnosis Date  . H/O aortic valve replacement     2D ECHO, 04/26/2011 - EF 50-55%, normal-mild  . CAD (coronary artery disease)     NUCLEAR STRESS TEST, 10/07/2008 - no ischemia  . Dyspnea on exertion   . Hypertension     Medications:  Prescriptions prior to admission  Medication Sig Dispense Refill  . amLODipine (NORVASC) 5 MG tablet Take 5 mg by mouth daily.      Marland Kitchen aspirin EC 81 MG tablet Take 81 mg by mouth daily.      . calcium carbonate (OS-CAL) 600 MG TABS Take 600 mg by mouth 2 (two) times daily with a meal.      . Docusate Calcium (STOOL SOFTENER PO) Take 1 capsule by mouth every evening.       Marland Kitchen losartan-hydrochlorothiazide (HYZAAR) 100-25 MG per tablet Take 1 tablet by mouth daily.      . Multiple Vitamin (MULTIVITAMIN) tablet Take 1 tablet by  mouth daily.      Marland Kitchen omeprazole (PRILOSEC) 20 MG capsule Take 20 mg by mouth daily.      . rosuvastatin (CRESTOR) 10 MG tablet Take 5 mg by mouth daily.      Marland Kitchen warfarin (COUMADIN) 5 MG tablet Take 5-7.5 mg by mouth daily. M,W,F, Sat 7.5mg  All other days, 5mg        Scheduled:  . amLODipine  5 mg Oral Daily  . aspirin EC  81 mg Oral Daily  . calcium carbonate  1 tablet Oral BID WC  . ciprofloxacin  250 mg Oral BID  . losartan  100 mg Oral Daily  . multivitamin with minerals  1 tablet Oral Daily  . pantoprazole  40 mg Oral Daily  . rosuvastatin  5 mg Oral q1800  . sodium chloride  3 mL Intravenous Q12H  . Warfarin - Pharmacist Dosing Inpatient   Does not apply q1800    Assessment: 72 yo female here with bradycardia. Patient is on coumadin at home for history of AVR in 2006.  INR today is 2.19, at goal. Patient noted on cipro for UTI. Patient did not take dose of coumadin on 6/15.   Home coumadin dose:  7.5mg  MWFSa, 5mg  SuTT   Goal of Therapy:  INR 2-3 Monitor platelets by anticoagulation protocol: Yes   Plan:  Coumadin 7.5 mg tonight x 1 dose.  Daily PT/INR  Wendie Simmer, PharmD, BCPS Clinical Pharmacist  Pager: [phone]

## 2012-07-11 NOTE — Progress Notes (Signed)
[facility]  Subjective: No further complaints.  Objective: Vital signs in last 24 hours: Temp:  [97.9 F (36.6 C)-98.7 F (37.1 C)] 97.9 F (36.6 C) (06/17 0459) Pulse Rate:  [66-71] 66 (06/17 0459) Resp:  [16-18] 18 (06/17 0459) BP: (105-126)/(55-60) 105/55 mmHg (06/17 0917) SpO2:  [93 %-97 %] 93 % (06/17 0459) Weight:  [198 lb 1.6 oz (89.858 kg)] 198 lb 1.6 oz (89.858 kg) (06/17 0601) Last BM Date: 07/08/12  Intake/Output from previous day: 06/16 0701 - 06/17 0700 In: 720 [P.O.:720] Out: 225 [Urine:225] Intake/Output this shift: Total I/O In: 240 [P.O.:240] Out: -   Medications Current Facility-Administered Medications  Medication Dose Route Frequency Provider Last Rate Last Dose  . amLODipine (NORVASC) tablet 5 mg  5 mg Oral Daily Leeann Must, MD   5 mg at 07/11/12 0917  . aspirin EC tablet 81 mg  81 mg Oral Daily Leeann Must, MD   81 mg at 07/11/12 0917  . calcium carbonate (OS-CAL - dosed in mg of elemental calcium) tablet 500 mg of elemental calcium  1 tablet Oral BID WC Bevely Hackbart, MD   500 mg of elemental calcium at 07/11/12 0821  . ciprofloxacin (CIPRO) tablet 250 mg  250 mg Oral BID Brittainy Simmons, PA-C   250 mg at 07/11/12 1610  . docusate sodium (COLACE) capsule 100 mg  100 mg Oral BID PRN Leeann Must, MD      . losartan (COZAAR) tablet 100 mg  100 mg Oral Daily Leeann Must, MD   100 mg at 07/11/12 9604  . multivitamin with minerals tablet 1 tablet  1 tablet Oral Daily Leeann Must, MD   1 tablet at 07/11/12 [phone]  . pantoprazole (PROTONIX) EC tablet 40 mg  40 mg Oral Daily Leeann Must, MD   40 mg at 07/11/12 0917  . rosuvastatin (CRESTOR) tablet 5 mg  5 mg Oral q1800 Temari Schooler, MD   5 mg at 07/10/12 1720  . sodium chloride 0.9 % injection 3 mL  3 mL Intravenous Q12H Leeann Must, MD   3 mL at 07/11/12 0918  . warfarin (COUMADIN) tablet 7.5 mg  7.5 mg Oral ONCE-1800 Muscab Brenneman, MD      . Warfarin - Pharmacist Dosing  Inpatient   Does not apply q1800 Benny Lennert, [facility]        PE: General appearance: alert, cooperative and no distress Lungs: clear to auscultation bilaterally Heart: regular rate and rhythm Extremities: no LEE Pulses: 2+ and symmetric Skin: warm and dry Neurologic: Grossly normal  Lab Results:   Recent Labs  07/09/12 1952  WBC 7.9  HGB 12.8  HCT 36.0  PLT 237   BMET  Recent Labs  07/09/12 1952 07/10/12 0153 07/10/12 2105  NA 140 138 140  K 2.7* 3.9 3.4*  CL 104 102 103  CO2 24 28 29   GLUCOSE 136* 137* 134*  BUN 14 11 13   CREATININE 0.64 0.56 0.73  CALCIUM 9.1 8.6 9.1   PT/INR  Recent Labs  07/09/12 1952 07/10/12 0153 07/10/12 2105  LABPROT 28.2* 25.0* 23.4*  INR 2.82* 2.39* 2.19*    Assessment/Plan  Principal Problem:   Symptomatic bradycardia Active Problems:   Long term (current) use of anticoagulants   H/O aortic valve replacement   Hypokalemia   Hypertension  Plan: no further bradycardia on telemetry. HR and BP stable. No further nausea/vomitting. Plan for discharge home today with follow-up with Dr. Royann Shivers in 4- 6 weeks.  LOS: 2 days    Brittainy M. Delmer Islam 07/11/2012 10:43 AM  I have seen and examined the patient along with Brittainy M. Sharol Harness, PA-C.  I have reviewed the chart, notes and new data.  I agree with PA's note.  Key new complaints: feels great - walked the hall without problems Key examination changes: no bradycardia on monitor in 24h, crisp prosthetic valve clicks Key new findings / data: K 3.4 yesterday, INR therapeutic; Urine culture growing E. Coli (sensitivities pending)  PLAN: DC home on Cipro. Recheck INR end of week due to risk of warfarin interaction. Suspect nausea and vomiting (UTI-related?) was the driving cause for vagal induced bradycardia, as well as electrolyte abnormality. No convincing indication for pacemaker therapy.  Thurmon Fair, MD, [facility] [phone] 07/11/2012, 10:52 AM

## 2012-07-11 NOTE — Assessment & Plan Note (Signed)
Mechanical prosthesis, St. Jude 23 mm model AK501, Dr. Tyrone Sage August 2006 for critical aortic stenosis with normal function by echocardiogram April 2013 (peak gradient of 28, mean gradient 16 mm Hg) Incidental note of totally occluded second oblique marginal artery by preoperative angiography, normal left ventricular systolic function.

## 2012-07-12 LAB — URINE CULTURE

## 2012-07-14 ENCOUNTER — Ambulatory Visit (INDEPENDENT_AMBULATORY_CARE_PROVIDER_SITE_OTHER): Payer: Medicare Other | Admitting: Pharmacist Clinician (PhC)/ Clinical Pharmacy Specialist

## 2012-07-14 VITALS — BP 142/70 | HR 54

## 2012-07-14 DIAGNOSIS — Z7901 Long term (current) use of anticoagulants: Secondary | ICD-10-CM

## 2012-07-14 DIAGNOSIS — Z952 Presence of prosthetic heart valve: Secondary | ICD-10-CM

## 2012-07-14 DIAGNOSIS — Z954 Presence of other heart-valve replacement: Secondary | ICD-10-CM

## 2012-07-17 ENCOUNTER — Telehealth: Payer: Self-pay | Admitting: Cardiovascular Disease

## 2012-07-17 NOTE — Telephone Encounter (Signed)
LEFT MESSAGE FOR PATIENT TO CALL BACK REGARDING APPT

## 2012-07-19 ENCOUNTER — Encounter: Payer: Self-pay | Admitting: Cardiovascular Disease

## 2012-08-03 ENCOUNTER — Ambulatory Visit (HOSPITAL_COMMUNITY)
Admission: RE | Admit: 2012-08-03 | Discharge: 2012-08-03 | Disposition: A | Discharge status: Home / Self Care | Payer: Medicare Other | Source: Ambulatory Visit | Attending: Cardiovascular Disease | Admitting: Cardiovascular Disease

## 2012-08-03 ENCOUNTER — Ambulatory Visit (HOSPITAL_COMMUNITY): Payer: Medicare Other

## 2012-08-03 DIAGNOSIS — I2510 Atherosclerotic heart disease of native coronary artery without angina pectoris: Secondary | ICD-10-CM | POA: Insufficient documentation

## 2012-08-03 DIAGNOSIS — Z952 Presence of prosthetic heart valve: Secondary | ICD-10-CM

## 2012-08-03 DIAGNOSIS — Z09 Encounter for follow-up examination after completed treatment for conditions other than malignant neoplasm: Secondary | ICD-10-CM | POA: Insufficient documentation

## 2012-08-03 DIAGNOSIS — E785 Hyperlipidemia, unspecified: Secondary | ICD-10-CM | POA: Insufficient documentation

## 2012-08-03 DIAGNOSIS — I359 Nonrheumatic aortic valve disorder, unspecified: Secondary | ICD-10-CM

## 2012-08-03 DIAGNOSIS — I10 Essential (primary) hypertension: Secondary | ICD-10-CM | POA: Insufficient documentation

## 2012-08-03 DIAGNOSIS — Z954 Presence of other heart-valve replacement: Secondary | ICD-10-CM | POA: Insufficient documentation

## 2012-08-03 NOTE — Progress Notes (Signed)
[facility]   2D echo completed 08/03/2012.   Cindy Barbra Miner, RDCS  

## 2012-08-07 ENCOUNTER — Telehealth: Payer: Self-pay | Admitting: *Deleted

## 2012-08-07 NOTE — Telephone Encounter (Signed)
Message copied by Tobin Chad on Mon Aug 07, 2012  7:06 PM ------      Message from: Thurmon Fair      Created: Sun Aug 06, 2012  6:50 PM       Echo is OK, prosthetic valve is normal ------

## 2012-08-07 NOTE — Telephone Encounter (Signed)
Spoke to patient results given. Verbalized understanding 

## 2012-08-10 ENCOUNTER — Ambulatory Visit (INDEPENDENT_AMBULATORY_CARE_PROVIDER_SITE_OTHER): Payer: Medicare Other | Admitting: Pharmacist Clinician (PhC)/ Clinical Pharmacy Specialist

## 2012-08-10 ENCOUNTER — Ambulatory Visit (INDEPENDENT_AMBULATORY_CARE_PROVIDER_SITE_OTHER): Payer: Medicare Other | Admitting: Cardiovascular Disease

## 2012-08-10 ENCOUNTER — Encounter: Payer: Self-pay | Admitting: Cardiovascular Disease

## 2012-08-10 VITALS — BP 108/72 | HR 68 | Resp 14 | Ht 66.0 in | Wt 198.7 lb

## 2012-08-10 DIAGNOSIS — Z952 Presence of prosthetic heart valve: Secondary | ICD-10-CM

## 2012-08-10 DIAGNOSIS — I2510 Atherosclerotic heart disease of native coronary artery without angina pectoris: Secondary | ICD-10-CM

## 2012-08-10 DIAGNOSIS — Z7901 Long term (current) use of anticoagulants: Secondary | ICD-10-CM

## 2012-08-10 DIAGNOSIS — R001 Bradycardia, unspecified: Secondary | ICD-10-CM

## 2012-08-10 DIAGNOSIS — I498 Other specified cardiac arrhythmias: Secondary | ICD-10-CM

## 2012-08-10 DIAGNOSIS — Z954 Presence of other heart-valve replacement: Secondary | ICD-10-CM

## 2012-08-10 DIAGNOSIS — I10 Essential (primary) hypertension: Secondary | ICD-10-CM

## 2012-08-10 NOTE — Patient Instructions (Signed)
Your physician recommends that you schedule a follow-up appointment in: 1 year  

## 2012-08-11 ENCOUNTER — Encounter: Payer: Self-pay | Admitting: Cardiovascular Disease

## 2012-08-11 NOTE — Assessment & Plan Note (Signed)
Incidentally noted total occlusion of the mid second oblique marginal artery by preoperative coronary angiography in 2006. No symptoms of angina. No evidence of ischemia by nuclear stress testing. Repeat coronary angiography in March 2011 did not show any evidence of occlusion of the second oblique marginal branch. No symptoms to suggest active coronary insufficiency to

## 2012-08-11 NOTE — Assessment & Plan Note (Signed)
Normally functioning mechanical prosthesis, most recent echocardiographic evaluation shows peak and mean gradients of 23 and 50 mm Hg and no relevant insufficiency.

## 2012-08-11 NOTE — Assessment & Plan Note (Signed)
Well compensated 

## 2012-08-11 NOTE — Progress Notes (Signed)
Patient ID: Sharon Rose, female   DOB: 06-09-1940, 72 y.o.   MRN: [id number]     Reason for office visit Followup after brief hospitalization for symptomatic bradycardia; status post aortic valve replacement  The patient now feels well. She was briefly hospitalized in June with severe symptomatic bradycardia in the setting of severe hypokalemia. Correction of her electrolyte abnormalities seemed to lead to resolution of the bradycardia patient now feels back to normal. She is quite active and denies any problems with shortness of breath with usual activity although she does develop dyspnea with greater than usual exertion. She denies chest pain syncope palpitations or other cardiovascular problems. She has a 23 mm aortic valve mechanical prosthesis, implanted roughly 8 years ago and is functioning normally by the repeat echocardiogram that was performed a few days ago. Shows normal left ventricular systolic function    Allergies  Allergen Reactions  . Codeine Nausea And Vomiting and Other (See Comments)    GI upset, dizziness  . Statins Other (See Comments)    Joint and muscle pain  . Sulfa Antibiotics Nausea And Vomiting and Other (See Comments)    Gi upset, dizziness   . Verapamil Other (See Comments)    Constipation    Current Outpatient Prescriptions  Medication Sig Dispense Refill  . amLODipine (NORVASC) 5 MG tablet Take 5 mg by mouth daily.      Marland Kitchen aspirin EC 81 MG tablet Take 81 mg by mouth daily.      . calcium carbonate (OS-CAL) 600 MG TABS Take 600 mg by mouth 2 (two) times daily with a meal.      . Docusate Calcium (STOOL SOFTENER PO) Take 1 capsule by mouth every evening.       Marland Kitchen losartan (COZAAR) 100 MG tablet Take 1 tablet (100 mg total) by mouth daily.  30 tablet  5  . Multiple Vitamin (MULTIVITAMIN) tablet Take 1 tablet by mouth daily.      Marland Kitchen omeprazole (PRILOSEC) 20 MG capsule Take 20 mg by mouth daily.      . rosuvastatin (CRESTOR) 10 MG tablet Take 5 mg by mouth  daily.      Marland Kitchen warfarin (COUMADIN) 5 MG tablet Take 5-7.5 mg by mouth daily. M,W,F, Sat 7.5mg  All other days, 5mg        No current facility-administered medications for this visit.    Past Medical History  Diagnosis Date  . H/O aortic valve replacement     2D ECHO, 04/26/2011 - EF 50-55%, normal-mild  . CAD (coronary artery disease)     NUCLEAR STRESS TEST, 10/07/2008 - no ischemia  . Dyspnea on exertion   . Hypertension     Past Surgical History  Procedure Laterality Date  . Cardiac catheterization Left 03/25/2009    No recommendations-normal findings  . Cardiac catheterization  01/20/2005    Medical management  . Cardiac catheterization  08/11/2004    Elective aortic valve replacement, severe aortic valvre stenosis with functional class II to III symptomatology  . Aortic valve replacement  09/12/2004    St. Jude mechanical aortic valve, model #23AK-501, serial [id number]    No family history on file.  History   Social History  . Marital Status: Married    Spouse Name: N/A    Number of Children: N/A  . Years of Education: N/A   Occupational History  . Not on file.   Social History Main Topics  . Smoking status: Never Smoker   . Smokeless tobacco: Never Used  .  Alcohol Use: No  . Drug Use: No  . Sexually Active: Not on file   Other Topics Concern  . Not on file   Social History Narrative  . No narrative on file    Review of systems: The patient specifically denies any chest pain at rest or with exertion, dyspnea at rest or with exertion, orthopnea, paroxysmal nocturnal dyspnea, syncope, palpitations, focal neurological deficits, intermittent claudication, lower extremity edema, unexplained weight gain, cough, hemoptysis or wheezing.  The patient also denies abdominal pain, nausea, vomiting, dysphagia, diarrhea, constipation, polyuria, polydipsia, dysuria, hematuria, frequency, urgency, abnormal bleeding or bruising, fever, chills, unexpected weight changes, mood  swings, change in skin or hair texture, change in voice quality, auditory or visual problems, allergic reactions or rashes, new musculoskeletal complaints other than usual "aches and pains".   PHYSICAL EXAM BP 108/72  Pulse 68  Resp 14  Ht 5\' 6"  (1.676 m)  Wt 198 lb 11.2 oz (90.13 kg)  BMI 32.09 kg/m2  General: Alert, oriented x3, no distress Head: no evidence of trauma, PERRL, EOMI, no exophtalmos or lid lag, no myxedema, no xanthelasma; normal ears, nose and oropharynx Neck: normal jugular venous pulsations and no hepatojugular reflux; brisk carotid pulses without delay and no carotid bruits Chest: clear to auscultation, no signs of consolidation by percussion or palpation, normal fremitus, symmetrical and full respiratory excursions Cardiovascular: normal position and quality of the apical impulse, regular rhythm, normal first and second heart sounds, very crisp prostatic valve clicks, grade 2-3/6 early peaking systolic ejection murmur, no rubs or gallops Abdomen: no tenderness or distention, no masses by palpation, no abnormal pulsatility or arterial bruits, normal bowel sounds, no hepatosplenomegaly Extremities: no clubbing, cyanosis or edema; 2+ radial, ulnar and brachial pulses bilaterally; 2+ right femoral, posterior tibial and dorsalis pedis pulses; 2+ left femoral, posterior tibial and dorsalis pedis pulses; no subclavian or femoral bruits Neurological: grossly nonfocal   EKG: Sinus rhythm with first degree AV block  Lipid Panel     Component Value Date/Time   CHOL  Value: 121        ATP III CLASSIFICATION:  <200     mg/dL   Desirable  308-657  mg/dL   Borderline High  >=846    mg/dL   High        10/01/2950 0145   TRIG 57 03/25/2009 0145   HDL 39* 03/25/2009 0145   CHOLHDL 3.1 03/25/2009 0145   VLDL 11 03/25/2009 0145   LDLCALC  Value: 71        Total Cholesterol/HDL:CHD Risk Coronary Heart Disease Risk Table                     Men   Women  1/2 Average Risk   3.4   3.3  Average Risk        5.0   4.4  2 X Average Risk   9.6   7.1  3 X Average Risk  23.4   11.0        Use the calculated Patient Ratio above and the CHD Risk Table to determine the patient's CHD Risk.        ATP III CLASSIFICATION (LDL):  <100     mg/dL   Optimal  841-324  mg/dL   Near or Above                    Optimal  130-159  mg/dL   Borderline  401-027  mg/dL   High  >  190     mg/dL   Very High 05/01/4257 5638    BMET    Component Value Date/Time   NA 140 07/10/2012 2105   K 3.4* 07/10/2012 2105   CL 103 07/10/2012 2105   CO2 29 07/10/2012 2105   GLUCOSE 134* 07/10/2012 2105   BUN 13 07/10/2012 2105   CREATININE 0.73 07/10/2012 2105   CALCIUM 9.1 07/10/2012 2105   GFRNONAA 85* 07/10/2012 2105   GFRAA >90 07/10/2012 2105     ASSESSMENT AND PLAN Symptomatic bradycardia Possibly related to electrolyte abnormalities, and this has not recurred. Medications with a negative chronotropic effect should probably be avoided.  H/O aortic valve replacement Normally functioning mechanical prosthesis, most recent echocardiographic evaluation shows peak and mean gradients of 23 and 50 mm Hg and no relevant insufficiency.  Hypertension Well compensated  CAD (coronary artery disease) Incidentally noted total occlusion of the mid second oblique marginal artery by preoperative coronary angiography in 2006. No symptoms of angina. No evidence of ischemia by nuclear stress testing. Repeat coronary angiography in March 2011 did not show any evidence of occlusion of the second oblique marginal branch. No symptoms to suggest active coronary insufficiency to  She'll return for yearly followup in the clinic and will continue followup in the Coumadin clinic. She specifically inquired about the risk of hypomagnesemia with chronic proton pump inhibitor therapy. I told her that her magnesium level was normal during her recent hospital visit.  Junious Silk, MD, [facility] [phone]  office [phone] pager

## 2012-08-11 NOTE — Assessment & Plan Note (Signed)
Possibly related to electrolyte abnormalities, and this has not recurred. Medications with a negative chronotropic effect should probably be avoided.

## 2012-08-16 ENCOUNTER — Ambulatory Visit: Payer: Medicare Other | Admitting: Pharmacist Clinician (PhC)/ Clinical Pharmacy Specialist

## 2012-09-21 ENCOUNTER — Ambulatory Visit (INDEPENDENT_AMBULATORY_CARE_PROVIDER_SITE_OTHER): Payer: Medicare Other | Admitting: Pharmacist Clinician (PhC)/ Clinical Pharmacy Specialist

## 2012-09-21 VITALS — BP 118/64 | HR 60

## 2012-09-21 DIAGNOSIS — Z7901 Long term (current) use of anticoagulants: Secondary | ICD-10-CM

## 2012-09-21 DIAGNOSIS — Z954 Presence of other heart-valve replacement: Secondary | ICD-10-CM

## 2012-09-21 DIAGNOSIS — Z952 Presence of prosthetic heart valve: Secondary | ICD-10-CM

## 2012-09-21 LAB — POCT INR: INR: 3.1

## 2012-11-01 ENCOUNTER — Ambulatory Visit (INDEPENDENT_AMBULATORY_CARE_PROVIDER_SITE_OTHER): Payer: Medicare Other | Admitting: Pharmacist Clinician (PhC)/ Clinical Pharmacy Specialist

## 2012-11-01 VITALS — BP 108/62 | HR 60

## 2012-11-01 DIAGNOSIS — Z954 Presence of other heart-valve replacement: Secondary | ICD-10-CM

## 2012-11-01 DIAGNOSIS — Z952 Presence of prosthetic heart valve: Secondary | ICD-10-CM

## 2012-11-01 DIAGNOSIS — Z7901 Long term (current) use of anticoagulants: Secondary | ICD-10-CM

## 2012-11-01 LAB — POCT INR: INR: 2.7

## 2012-11-02 ENCOUNTER — Ambulatory Visit: Payer: Medicare Other | Admitting: Pharmacist Clinician (PhC)/ Clinical Pharmacy Specialist

## 2012-12-13 ENCOUNTER — Ambulatory Visit: Payer: Medicare Other | Admitting: Pharmacist Clinician (PhC)/ Clinical Pharmacy Specialist

## 2012-12-20 ENCOUNTER — Ambulatory Visit (INDEPENDENT_AMBULATORY_CARE_PROVIDER_SITE_OTHER): Payer: Medicare Other | Admitting: Pharmacist Clinician (PhC)/ Clinical Pharmacy Specialist

## 2012-12-20 VITALS — BP 142/80 | HR 60

## 2012-12-20 DIAGNOSIS — Z952 Presence of prosthetic heart valve: Secondary | ICD-10-CM

## 2012-12-20 DIAGNOSIS — Z954 Presence of other heart-valve replacement: Secondary | ICD-10-CM

## 2012-12-20 DIAGNOSIS — Z7901 Long term (current) use of anticoagulants: Secondary | ICD-10-CM

## 2013-01-31 ENCOUNTER — Ambulatory Visit (INDEPENDENT_AMBULATORY_CARE_PROVIDER_SITE_OTHER): Payer: Medicare Other | Admitting: Pharmacist Clinician (PhC)/ Clinical Pharmacy Specialist

## 2013-01-31 VITALS — BP 156/74 | HR 76

## 2013-01-31 DIAGNOSIS — Z7901 Long term (current) use of anticoagulants: Secondary | ICD-10-CM

## 2013-01-31 DIAGNOSIS — Z952 Presence of prosthetic heart valve: Secondary | ICD-10-CM

## 2013-01-31 DIAGNOSIS — Z954 Presence of other heart-valve replacement: Secondary | ICD-10-CM

## 2013-01-31 LAB — POCT INR: INR: 3.2

## 2013-03-14 ENCOUNTER — Ambulatory Visit: Payer: Medicare Other | Admitting: Pharmacist Clinician (PhC)/ Clinical Pharmacy Specialist

## 2013-03-22 ENCOUNTER — Ambulatory Visit: Payer: Medicare Other | Admitting: Pharmacist Clinician (PhC)/ Clinical Pharmacy Specialist

## 2013-03-30 ENCOUNTER — Ambulatory Visit (INDEPENDENT_AMBULATORY_CARE_PROVIDER_SITE_OTHER): Payer: Medicare Other | Admitting: Pharmacist Clinician (PhC)/ Clinical Pharmacy Specialist

## 2013-03-30 VITALS — BP 120/72 | HR 56

## 2013-03-30 DIAGNOSIS — Z954 Presence of other heart-valve replacement: Secondary | ICD-10-CM

## 2013-03-30 DIAGNOSIS — Z952 Presence of prosthetic heart valve: Secondary | ICD-10-CM

## 2013-03-30 DIAGNOSIS — Z7901 Long term (current) use of anticoagulants: Secondary | ICD-10-CM

## 2013-03-30 LAB — POCT INR: INR: 2.6

## 2013-05-11 ENCOUNTER — Ambulatory Visit (INDEPENDENT_AMBULATORY_CARE_PROVIDER_SITE_OTHER): Payer: Medicare Other | Admitting: Pharmacist Clinician (PhC)/ Clinical Pharmacy Specialist

## 2013-05-11 DIAGNOSIS — Z952 Presence of prosthetic heart valve: Secondary | ICD-10-CM

## 2013-05-11 DIAGNOSIS — Z7901 Long term (current) use of anticoagulants: Secondary | ICD-10-CM

## 2013-05-11 DIAGNOSIS — Z954 Presence of other heart-valve replacement: Secondary | ICD-10-CM

## 2013-05-11 LAB — POCT INR: INR: 2.6

## 2013-06-22 ENCOUNTER — Ambulatory Visit (INDEPENDENT_AMBULATORY_CARE_PROVIDER_SITE_OTHER): Payer: Medicare Other | Admitting: Pharmacist Clinician (PhC)/ Clinical Pharmacy Specialist

## 2013-06-22 VITALS — BP 122/76 | HR 56

## 2013-06-22 DIAGNOSIS — Z954 Presence of other heart-valve replacement: Secondary | ICD-10-CM

## 2013-06-22 DIAGNOSIS — Z7901 Long term (current) use of anticoagulants: Secondary | ICD-10-CM

## 2013-06-22 DIAGNOSIS — Z952 Presence of prosthetic heart valve: Secondary | ICD-10-CM

## 2013-06-22 LAB — POCT INR: INR: 3.2

## 2013-07-02 ENCOUNTER — Ambulatory Visit (INDEPENDENT_AMBULATORY_CARE_PROVIDER_SITE_OTHER): Payer: Medicare Other | Admitting: Cardiovascular Disease

## 2013-07-02 ENCOUNTER — Encounter: Payer: Self-pay | Admitting: Cardiovascular Disease

## 2013-07-02 VITALS — BP 144/80 | HR 69 | Resp 16 | Ht 67.0 in | Wt 189.4 lb

## 2013-07-02 DIAGNOSIS — E785 Hyperlipidemia, unspecified: Secondary | ICD-10-CM

## 2013-07-02 DIAGNOSIS — Z952 Presence of prosthetic heart valve: Secondary | ICD-10-CM

## 2013-07-02 DIAGNOSIS — Z7901 Long term (current) use of anticoagulants: Secondary | ICD-10-CM

## 2013-07-02 DIAGNOSIS — Z954 Presence of other heart-valve replacement: Secondary | ICD-10-CM

## 2013-07-02 DIAGNOSIS — I10 Essential (primary) hypertension: Secondary | ICD-10-CM

## 2013-07-02 DIAGNOSIS — I2510 Atherosclerotic heart disease of native coronary artery without angina pectoris: Secondary | ICD-10-CM

## 2013-07-02 MED ORDER — HYDROCHLOROTHIAZIDE 12.5 MG PO CAPS: ORAL_CAPSULE | ORAL | Status: DC

## 2013-07-02 NOTE — Patient Instructions (Signed)
Start HCTZ 12.5mg  on Mondays/Wednesdays/Fridays.  Check your Blood pressure and record the readings and bring them in with you when you see Sharon Rose.  Dr. Sallyanne Kuster recommends that you schedule a follow-up appointment in: one year.

## 2013-07-05 ENCOUNTER — Telehealth: Payer: Self-pay | Admitting: Pharmacist Clinician (PhC)/ Clinical Pharmacy Specialist

## 2013-07-06 NOTE — Telephone Encounter (Signed)
Closed encounter °

## 2013-07-07 NOTE — Progress Notes (Signed)
Rose ID: Sharon Rose, female   DOB: 14-Dec-1940, 73 y.o.   MRN: [id number]      Reason for office visit S/P AVR, hyperlipidemia  Sharon Rose is now 73 years old and had replacement of a severely stenotic, probably congenitally bicuspid aortic valve in 2006 with a 23 mm mechanical valve. She has not had any problems related to Sharon prosthetic valve since that time. She has HTN and dyslipidemia that is treated with a statin. Coronary angiography before surgery showed total occlusion of Sharon mid second oblique marginal artery by preoperative coronary angiography in 2006. No symptoms of angina. No evidence of ischemia by nuclear stress testing. Repeat coronary angiography in March 2011 did not confirm occlusion of Sharon second oblique marginal branch. She has normal LV function and no history of embolic events.  In 2014 she presented with thiazide induced hypokalemia and bradycardia. Her heart rate normalized after electrolyte correction.  Her husband recently died suddenly, but she seems to be handling her loss well.  Over Sharon last few months her BP has been slightly high, in Sharon 140-160s.    Allergies  Allergen Reactions  . Codeine Nausea And Vomiting and Other (See Comments)    GI upset, dizziness  . Statins Other (See Comments)    Joint and muscle pain  . Sulfa Antibiotics Nausea And Vomiting and Other (See Comments)    Gi upset, dizziness   . Verapamil Other (See Comments)    Constipation    Current Outpatient Prescriptions  Medication Sig Dispense Refill  . amLODipine (NORVASC) 5 MG tablet Take 5 mg by mouth daily.      Marland Kitchen aspirin EC 81 MG tablet Take 81 mg by mouth daily.      . calcium carbonate (OS-CAL) 600 MG TABS Take 600 mg by mouth 2 (two) times daily with a meal.      . Docusate Calcium (STOOL SOFTENER PO) Take 1 capsule by mouth every evening.       Marland Kitchen losartan (COZAAR) 100 MG tablet Take 1 tablet (100 mg total) by mouth daily.  30 tablet  5  . Multiple Vitamin  (MULTIVITAMIN) tablet Take 1 tablet by mouth daily.      Marland Kitchen omeprazole (PRILOSEC) 20 MG capsule Take 20 mg by mouth daily.      . rosuvastatin (CRESTOR) 10 MG tablet Take 5 mg by mouth daily.      Marland Kitchen warfarin (COUMADIN) 5 MG tablet Take 5-7.5 mg by mouth daily. M,W,F, Sat 7.5mg  All other days, 5mg       . hydrochlorothiazide (MICROZIDE) 12.5 MG capsule Take Mondays/Wednesday/Fridays  30 capsule  6   No current facility-administered medications for this visit.    Past Medical History  Diagnosis Date  . H/O aortic valve replacement     2D ECHO, 04/26/2011 - EF 50-55%, normal-mild  . CAD (coronary artery disease)     NUCLEAR STRESS TEST, 10/07/2008 - no ischemia  . Dyspnea on exertion   . Hypertension     Past Surgical History  Procedure Laterality Date  . Cardiac catheterization Left 03/25/2009    No recommendations-normal findings  . Cardiac catheterization  01/20/2005    Medical management  . Cardiac catheterization  08/11/2004    Elective aortic valve replacement, severe aortic valvre stenosis with functional class II to III symptomatology  . Aortic valve replacement  09/12/2004    St. Jude mechanical aortic valve, model #23AK-501, serial [id number]    No family history on file.  History  Social History  . Marital Status: Married    Spouse Name: N/A    Number of Children: N/A  . Years of Education: N/A   Occupational History  . Not on file.   Social History Main Topics  . Smoking status: Never Smoker   . Smokeless tobacco: Never Used  . Alcohol Use: No  . Drug Use: No  . Sexual Activity: Not on file   Other Topics Concern  . Not on file   Social History Narrative  . No narrative on file    Review of systems: Sharon Rose specifically denies any chest pain at rest or with exertion, dyspnea at rest or with exertion, orthopnea, paroxysmal nocturnal dyspnea, syncope, palpitations, focal neurological deficits, intermittent claudication, lower extremity edema, unexplained  weight gain, cough, hemoptysis or wheezing.  Sharon Rose also denies abdominal pain, nausea, vomiting, dysphagia, diarrhea, constipation, polyuria, polydipsia, dysuria, hematuria, frequency, urgency, abnormal bleeding or bruising, fever, chills, unexpected weight changes, mood swings, change in skin or hair texture, change in voice quality, auditory or visual problems, allergic reactions or rashes, new musculoskeletal complaints other than usual "aches and pains".   PHYSICAL EXAM BP 144/80  Pulse 69  Ht 5\' 7"  (1.702 m)  Wt 189 lb 6.4 oz (85.911 kg)  BMI 29.66 kg/m2 General: Alert, oriented x3, no distress  Head: no evidence of trauma, PERRL, EOMI, no exophtalmos or lid lag, no myxedema, no xanthelasma; normal ears, nose and oropharynx  Neck: normal jugular venous pulsations and no hepatojugular reflux; brisk carotid pulses without delay and no carotid bruits  Chest: clear to auscultation, no signs of consolidation by percussion or palpation, normal fremitus, symmetrical and full respiratory excursions  Cardiovascular: normal position and quality of Sharon apical impulse, regular rhythm, normal first and second heart sounds, very crisp prostatic valve clicks, grade 2-3/6 early peaking systolic ejection murmur, no rubs or gallops  Abdomen: no tenderness or distention, no masses by palpation, no abnormal pulsatility or arterial bruits, normal bowel sounds, no hepatosplenomegaly  Extremities: no clubbing, cyanosis or edema; 2+ radial, ulnar and brachial pulses bilaterally; 2+ right femoral, posterior tibial and dorsalis pedis pulses; 2+ left femoral, posterior tibial and dorsalis pedis pulses; no subclavian or femoral bruits  Neurological: grossly nonfocal  EKG: Sinus rhythm with first degree AV block   Lipid Panel     Component Value Date/Time   CHOL  Value: 121        ATP III CLASSIFICATION:  <200     mg/dL   Desirable  161-096  mg/dL   Borderline High  >=045    mg/dL   High        4/0/9811 0145     TRIG 57 03/25/2009 0145   HDL 39* 03/25/2009 0145   CHOLHDL 3.1 03/25/2009 0145   VLDL 11 03/25/2009 0145   LDLCALC  Value: 71        Total Cholesterol/HDL:CHD Risk Coronary Heart Disease Risk Table                     Men   Women  1/2 Average Risk   3.4   3.3  Average Risk       5.0   4.4  2 X Average Risk   9.6   7.1  3 X Average Risk  23.4   11.0        Use Sharon calculated Rose Ratio above and Sharon CHD Risk Table to determine Sharon Rose's CHD Risk.  ATP III CLASSIFICATION (LDL):  <100     mg/dL   Optimal  409-811  mg/dL   Near or Above                    Optimal  130-159  mg/dL   Borderline  914-782  mg/dL   High  >956     mg/dL   Very High 02/26/3084 5784    BMET    Component Value Date/Time   NA 140 07/10/2012 2105   K 3.4* 07/10/2012 2105   CL 103 07/10/2012 2105   CO2 29 07/10/2012 2105   GLUCOSE 134* 07/10/2012 2105   BUN 13 07/10/2012 2105   CREATININE 0.73 07/10/2012 2105   CALCIUM 9.1 07/10/2012 2105   GFRNONAA 85* 07/10/2012 2105   GFRAA >90 07/10/2012 2105     ASSESSMENT AND PLAN  H/O aortic valve replacement  Normally functioning mechanical prosthesis, most recent echocardiographic July 2014 evaluation shows peak and mean gradients of 23 and 15 mm Hg and no significant insufficiency.   Hypertension  Add a very low dose of diuretic (HCTZ 12.5 mg, 3 days a week). Avoid higher doses that caused symptomatic hypokalemia. Will follow up BP when she sees Phillips Hay in Sharon coumadin clinic.  CAD (coronary artery disease)  Incidentally noted total occlusion of Sharon mid second oblique marginal artery by preoperative coronary angiography in 2006. No symptoms of angina. No evidence of ischemia by nuclear stress testing. Repeat coronary angiography in March 2011 did not show any evidence of occlusion of Sharon second oblique marginal branch. No symptoms to suggest active coronary insufficiency. Lipid abs are checked by her primary care physician. Rose Instructions  Start HCTZ 12.5mg  on  Mondays/Wednesdays/Fridays.  Check your Blood pressure and record Sharon readings and bring them in with you when you see Belenda Cruise.  Dr. Royann Shivers recommends that you schedule a follow-up appointment in: one year.      Orders Placed This Encounter  Procedures  . EKG 12-Lead   Meds ordered this encounter  Medications  . hydrochlorothiazide (MICROZIDE) 12.5 MG capsule    Sig: Take Mondays/Wednesday/Fridays    Dispense:  30 capsule    Refill:  6    Lemoyne Nestor  Thurmon Fair, MD, [facility] [phone] office [phone] pager

## 2013-07-08 ENCOUNTER — Encounter: Payer: Self-pay | Admitting: Cardiovascular Disease

## 2013-07-30 ENCOUNTER — Encounter: Payer: Self-pay | Admitting: Cardiovascular Disease

## 2013-07-30 ENCOUNTER — Ambulatory Visit (INDEPENDENT_AMBULATORY_CARE_PROVIDER_SITE_OTHER): Payer: Medicare Other | Admitting: Pharmacist

## 2013-07-30 DIAGNOSIS — Z7901 Long term (current) use of anticoagulants: Secondary | ICD-10-CM

## 2013-07-30 DIAGNOSIS — Z954 Presence of other heart-valve replacement: Secondary | ICD-10-CM

## 2013-07-30 DIAGNOSIS — Z952 Presence of prosthetic heart valve: Secondary | ICD-10-CM

## 2013-07-30 LAB — POCT INR: INR: 2.9

## 2013-07-30 NOTE — Patient Instructions (Signed)
Thank you for enrolling in [facility]. Please follow the instructions below to securely access your online medical record. MyChart allows you to send messages to your doctor, view your test results, renew your prescriptions, schedule appointments, and more.  How Do I Sign Up? 1. In your Internet browser, go to [email or web address]. 2. Click on the New  User? link in the Sign In box.  3. Enter your MyChart Access Code exactly as it appears below. You will not need to use this code after you have completed the sign-up process. If you do not sign up before the expiration date, you must request a new code. MyChart Access Code: [email or web address] Expires: 08/31/2013  3:46 PM  4. Enter the last four digits of your Social Security Number (xxxx) and Date of Birth (mm/dd/yyyy) as indicated and click Next. You will be taken to the next sign-up page. 5. Create a MyChart ID. This will be your MyChart login ID and cannot be changed, so think of one that is secure and easy to remember. 6. Create a MyChart password. You can change your password at any time. 7. Enter your Password Reset Question and Answer and click Next. This can be used at a later time if you forget your password.  8. Select your communication preference, and if applicable enter your e-mail address. You will receive e-mail notification when new information is available in MyChart by choosing to receive e-mail notifications and filling in your e-mail. 9. Click Sign In. You can now view your medical record.   Additional Information If you have questions, you can email [email or web address] or call [phone] to talk to our [place] staff. Remember, MyChart is NOT to be used for urgent needs. For medical emergencies, dial 911.

## 2013-08-13 ENCOUNTER — Telehealth: Payer: Self-pay | Admitting: Cardiovascular Disease

## 2013-08-13 MED ORDER — HYDROCHLOROTHIAZIDE 12.5 MG PO CAPS: ORAL_CAPSULE | ORAL | Status: DC

## 2013-08-13 NOTE — Telephone Encounter (Signed)
Rx refill sent to patient pharmacy   

## 2013-08-13 NOTE — Telephone Encounter (Signed)
Pt need a new prescripton for Hydrochlorothiazide 12.5 mg #90 and refill. Please send to [facility]

## 2013-08-27 ENCOUNTER — Other Ambulatory Visit: Payer: Self-pay | Admitting: Pharmacist Clinician (PhC)/ Clinical Pharmacy Specialist

## 2013-08-27 MED ORDER — WARFARIN SODIUM 5 MG PO TABS: ORAL_TABLET | ORAL | Status: DC

## 2013-09-10 ENCOUNTER — Ambulatory Visit: Payer: Medicare Other | Admitting: Pharmacist Clinician (PhC)/ Clinical Pharmacy Specialist

## 2013-09-14 ENCOUNTER — Ambulatory Visit (INDEPENDENT_AMBULATORY_CARE_PROVIDER_SITE_OTHER): Payer: Medicare Other | Admitting: Pharmacist Clinician (PhC)/ Clinical Pharmacy Specialist

## 2013-09-14 DIAGNOSIS — Z7901 Long term (current) use of anticoagulants: Secondary | ICD-10-CM

## 2013-09-14 DIAGNOSIS — Z952 Presence of prosthetic heart valve: Secondary | ICD-10-CM

## 2013-09-14 DIAGNOSIS — Z954 Presence of other heart-valve replacement: Secondary | ICD-10-CM

## 2013-09-14 LAB — POCT INR: INR: 2.4

## 2013-10-26 ENCOUNTER — Ambulatory Visit (INDEPENDENT_AMBULATORY_CARE_PROVIDER_SITE_OTHER): Payer: Medicare Other | Admitting: Pharmacist Clinician (PhC)/ Clinical Pharmacy Specialist

## 2013-10-26 VITALS — BP 118/70 | HR 68

## 2013-10-26 DIAGNOSIS — Z952 Presence of prosthetic heart valve: Secondary | ICD-10-CM

## 2013-10-26 DIAGNOSIS — Z954 Presence of other heart-valve replacement: Secondary | ICD-10-CM

## 2013-10-26 DIAGNOSIS — Z7901 Long term (current) use of anticoagulants: Secondary | ICD-10-CM

## 2013-10-26 LAB — POCT INR: INR: 3.5

## 2013-12-07 ENCOUNTER — Ambulatory Visit (INDEPENDENT_AMBULATORY_CARE_PROVIDER_SITE_OTHER): Payer: Medicare Other | Admitting: Pharmacist Clinician (PhC)/ Clinical Pharmacy Specialist

## 2013-12-07 DIAGNOSIS — Z952 Presence of prosthetic heart valve: Secondary | ICD-10-CM

## 2013-12-07 DIAGNOSIS — Z954 Presence of other heart-valve replacement: Secondary | ICD-10-CM

## 2013-12-07 DIAGNOSIS — Z7901 Long term (current) use of anticoagulants: Secondary | ICD-10-CM

## 2013-12-07 LAB — POCT INR: INR: 2.8

## 2014-01-21 ENCOUNTER — Ambulatory Visit (INDEPENDENT_AMBULATORY_CARE_PROVIDER_SITE_OTHER): Payer: Medicare Other | Admitting: Pharmacist Clinician (PhC)/ Clinical Pharmacy Specialist

## 2014-01-21 DIAGNOSIS — Z952 Presence of prosthetic heart valve: Secondary | ICD-10-CM

## 2014-01-21 DIAGNOSIS — Z954 Presence of other heart-valve replacement: Secondary | ICD-10-CM

## 2014-01-21 DIAGNOSIS — Z7901 Long term (current) use of anticoagulants: Secondary | ICD-10-CM

## 2014-01-21 LAB — POCT INR: INR: 2.5

## 2014-03-04 ENCOUNTER — Ambulatory Visit (INDEPENDENT_AMBULATORY_CARE_PROVIDER_SITE_OTHER): Payer: PPO | Admitting: Pharmacist Clinician (PhC)/ Clinical Pharmacy Specialist

## 2014-03-04 DIAGNOSIS — Z952 Presence of prosthetic heart valve: Secondary | ICD-10-CM

## 2014-03-04 DIAGNOSIS — Z954 Presence of other heart-valve replacement: Secondary | ICD-10-CM

## 2014-03-04 DIAGNOSIS — Z7901 Long term (current) use of anticoagulants: Secondary | ICD-10-CM

## 2014-03-04 LAB — POCT INR: INR: 2.8

## 2014-03-12 ENCOUNTER — Encounter: Payer: Self-pay | Admitting: Cardiovascular Disease

## 2014-04-15 ENCOUNTER — Ambulatory Visit (INDEPENDENT_AMBULATORY_CARE_PROVIDER_SITE_OTHER): Payer: PPO | Admitting: Pharmacist Clinician (PhC)/ Clinical Pharmacy Specialist

## 2014-04-15 DIAGNOSIS — Z954 Presence of other heart-valve replacement: Secondary | ICD-10-CM

## 2014-04-15 DIAGNOSIS — Z7901 Long term (current) use of anticoagulants: Secondary | ICD-10-CM

## 2014-04-15 DIAGNOSIS — Z952 Presence of prosthetic heart valve: Secondary | ICD-10-CM

## 2014-04-15 LAB — POCT INR: INR: 3

## 2014-05-08 ENCOUNTER — Other Ambulatory Visit: Payer: Self-pay | Admitting: Cardiovascular Disease

## 2014-05-08 MED ORDER — HYDROCHLOROTHIAZIDE 12.5 MG PO CAPS: ORAL_CAPSULE | ORAL | Status: DC

## 2014-05-08 NOTE — Telephone Encounter (Signed)
°  1. Which medications need to be refilled? Hydrochlorot-mew prescription,because of insurance   2. Which pharmacy is medication to be sent to?[phone]  3. Do they need a 30 day or 90 day supply? 90 and refills 4. Would they like a call back once the medication has been sent to the pharmacy? no

## 2014-05-08 NOTE — Telephone Encounter (Signed)
Rx refill sent to patient pharmacy   

## 2014-05-27 ENCOUNTER — Ambulatory Visit (INDEPENDENT_AMBULATORY_CARE_PROVIDER_SITE_OTHER): Payer: PPO | Admitting: Pharmacist Clinician (PhC)/ Clinical Pharmacy Specialist

## 2014-05-27 DIAGNOSIS — Z954 Presence of other heart-valve replacement: Secondary | ICD-10-CM | POA: Diagnosis not present

## 2014-05-27 DIAGNOSIS — Z7901 Long term (current) use of anticoagulants: Secondary | ICD-10-CM | POA: Diagnosis not present

## 2014-05-27 DIAGNOSIS — Z952 Presence of prosthetic heart valve: Secondary | ICD-10-CM

## 2014-05-27 LAB — POCT INR: INR: 2.7

## 2014-06-21 ENCOUNTER — Other Ambulatory Visit: Payer: Self-pay | Admitting: *Deleted

## 2014-06-21 MED ORDER — WARFARIN SODIUM 5 MG PO TABS: ORAL_TABLET | ORAL | Status: DC

## 2014-06-21 NOTE — Telephone Encounter (Signed)
Coumadin refilled electronically

## 2014-07-08 ENCOUNTER — Ambulatory Visit (INDEPENDENT_AMBULATORY_CARE_PROVIDER_SITE_OTHER): Payer: PPO | Admitting: Pharmacist Clinician (PhC)/ Clinical Pharmacy Specialist

## 2014-07-08 ENCOUNTER — Ambulatory Visit (INDEPENDENT_AMBULATORY_CARE_PROVIDER_SITE_OTHER): Payer: PPO | Admitting: Cardiovascular Disease

## 2014-07-08 ENCOUNTER — Encounter: Payer: Self-pay | Admitting: Cardiovascular Disease

## 2014-07-08 VITALS — BP 136/84 | HR 76 | Ht 67.0 in | Wt 187.3 lb

## 2014-07-08 DIAGNOSIS — I10 Essential (primary) hypertension: Secondary | ICD-10-CM

## 2014-07-08 DIAGNOSIS — Z952 Presence of prosthetic heart valve: Secondary | ICD-10-CM

## 2014-07-08 DIAGNOSIS — Z79899 Other long term (current) drug therapy: Secondary | ICD-10-CM

## 2014-07-08 DIAGNOSIS — Z7901 Long term (current) use of anticoagulants: Secondary | ICD-10-CM | POA: Diagnosis not present

## 2014-07-08 DIAGNOSIS — I2510 Atherosclerotic heart disease of native coronary artery without angina pectoris: Secondary | ICD-10-CM

## 2014-07-08 DIAGNOSIS — E785 Hyperlipidemia, unspecified: Secondary | ICD-10-CM

## 2014-07-08 DIAGNOSIS — Z954 Presence of other heart-valve replacement: Secondary | ICD-10-CM

## 2014-07-08 DIAGNOSIS — R002 Palpitations: Secondary | ICD-10-CM

## 2014-07-08 LAB — CBC
HCT: 39.8 % (ref 36.0–46.0)
HEMOGLOBIN: 13.3 g/dL (ref 12.0–15.0)
MCH: 30.9 pg (ref 26.0–34.0)
MCHC: 33.4 g/dL (ref 30.0–36.0)
MCV: 92.3 fL (ref 78.0–100.0)
MPV: 8.8 fL (ref 8.6–12.4)
Platelets: 351 10*3/uL (ref 150–400)
RBC: 4.31 MIL/uL (ref 3.87–5.11)
RDW: 13.6 % (ref 11.5–15.5)
WBC: 4.3 10*3/uL (ref 4.0–10.5)

## 2014-07-08 LAB — MAGNESIUM: MAGNESIUM: 2.1 mg/dL (ref 1.5–2.5)

## 2014-07-08 LAB — COMPREHENSIVE METABOLIC PANEL
ALT: 18 U/L (ref 0–35)
AST: 23 U/L (ref 0–37)
Albumin: 4.3 g/dL (ref 3.5–5.2)
Alkaline Phosphatase: 95 U/L (ref 39–117)
BILIRUBIN TOTAL: 0.7 mg/dL (ref 0.2–1.2)
BUN: 13 mg/dL (ref 6–23)
CHLORIDE: 103 meq/L (ref 96–112)
CO2: 27 meq/L (ref 19–32)
Calcium: 9.9 mg/dL (ref 8.4–10.5)
Creat: 0.59 mg/dL (ref 0.50–1.10)
Glucose, Bld: 92 mg/dL (ref 70–99)
Potassium: 4.1 mEq/L (ref 3.5–5.3)
Sodium: 141 mEq/L (ref 135–145)
Total Protein: 7.1 g/dL (ref 6.0–8.3)

## 2014-07-08 LAB — LIPID PANEL
Cholesterol: 190 mg/dL (ref 0–200)
HDL: 46 mg/dL (ref >=46)
LDL CALC: 106 mg/dL — AB (ref 0–99)
TRIGLYCERIDES: 188 mg/dL — AB (ref <150)
Total CHOL/HDL Ratio: 4.1 Ratio
VLDL: 38 mg/dL (ref 0–40)

## 2014-07-08 LAB — POCT INR: INR: 4.2

## 2014-07-08 NOTE — Progress Notes (Signed)
Patient ID: Sharon Rose, female   DOB: Nov 13, 1940, 74 y.o.   MRN: [id number]     Cardiology Office Note   Date:  07/08/2014   ID:  Sharon Rose, DOB 04/03/1940, MRN [id number]  PCP:  Thora Lance, MD  Cardiologist:   Thurmon Fair, MD   Chief Complaint  Patient presents with  . Annual Exam    episode of dizziness, nausea and vomiting Friday AM - this has resolved.  No chest pain,SOB, edema.      History of Present Illness: Sharon Rose is a 74 y.o. female who presents for f/u of mechanical AVR and hyperlipidemia and HTN.  Sharon Rose is now 74 years old and had replacement of a severely stenotic, probably congenitally bicuspid aortic valve in 2006 with a 23 mm mechanical valve. She has not had any problems related to the prosthetic valve since that time. She has HTN and dyslipidemia that is treated with a statin. Coronary angiography before surgery showed total occlusion of the mid second oblique marginal artery by preoperative coronary angiography in 2006. No symptoms of angina. No evidence of ischemia by nuclear stress testing. Repeat coronary angiography in March 2011 did not confirm occlusion of the second oblique marginal branch. She has normal LV function and no history of embolic events.  In 2014 she presented with thiazide induced hypokalemia and bradycardia. Her heart rate normalized after electrolyte correction. We tried to stop her diuretic completely, but this was associated with ankle swelling and difficult to control blood pressure. She is now taking hydrochlorothiazide 3 days a week.  Last week she had an episode that reminded her of the events in 2014 while she was doing physical therapy. She developed dizziness and lightheadedness while performing physical therapy for her back problems. She had nausea vomiting and diarrhea. She checked her blood pressure 168/90 and her heart rate 56. She felt better after drinking orange juice and resting. She has not had any  problems since.  Her electrocardiogram today shows frequent premature atrial complexes on a background of sinus rhythm with first-degree AV block and a rate of 68 bpm.  Past Medical History  Diagnosis Date  . H/O aortic valve replacement     2D ECHO, 04/26/2011 - EF 50-55%, normal-mild  . CAD (coronary artery disease)     NUCLEAR STRESS TEST, 10/07/2008 - no ischemia  . Dyspnea on exertion   . Hypertension     Past Surgical History  Procedure Laterality Date  . Cardiac catheterization Left 03/25/2009    No recommendations-normal findings  . Cardiac catheterization  01/20/2005    Medical management  . Cardiac catheterization  08/11/2004    Elective aortic valve replacement, severe aortic valvre stenosis with functional class II to III symptomatology  . Aortic valve replacement  09/12/2004    St. Jude mechanical aortic valve, model #23AK-501, serial [id number]     Current Outpatient Prescriptions  Medication Sig Dispense Refill  . acetaminophen (TYLENOL) 500 MG tablet Take 1,000 mg by mouth every 6 (six) hours as needed.    Marland Kitchen amLODipine (NORVASC) 5 MG tablet Take 5 mg by mouth daily.    Marland Kitchen aspirin EC 81 MG tablet Take 81 mg by mouth daily.    . calcium carbonate (OS-CAL) 600 MG TABS Take 600 mg by mouth 2 (two) times daily with a meal.    . hydrochlorothiazide (MICROZIDE) 12.5 MG capsule Take Mondays/Wednesday/Fridays 36 capsule 1  . losartan (COZAAR) 100 MG tablet Take 1 tablet (100 mg total) by  mouth daily. 30 tablet 5  . Multiple Vitamin (MULTIVITAMIN) tablet Take 1 tablet by mouth daily.    Marland Kitchen omeprazole (PRILOSEC) 20 MG capsule Take 20 mg by mouth daily.    . rosuvastatin (CRESTOR) 10 MG tablet Take 5 mg by mouth daily.    Marland Kitchen warfarin (COUMADIN) 5 MG tablet Take 1 to 1.5 tablets by mouth daily as directed by coumadin clinic 120 tablet 0   No current facility-administered medications for this visit.    Allergies:   Codeine; Statins; Sulfa antibiotics; and Verapamil    Social  History:  The patient  reports that she has never smoked. She has never used smokeless tobacco. She reports that she does not drink alcohol or use illicit drugs.    ROS:  Please see the history of present illness.    Otherwise, review of systems positive for none.   All other systems are reviewed and negative.    PHYSICAL EXAM: VS:  BP 136/84 mmHg  Pulse 76  Ht 5\' 7"  (1.702 m)  Wt 84.959 kg (187 lb 4.8 oz)  BMI 29.33 kg/m2 , BMI Body mass index is 29.33 kg/(m^2).  General: Alert, oriented x3, no distress Head: no evidence of trauma, PERRL, EOMI, no exophtalmos or lid lag, no myxedema, no xanthelasma; normal ears, nose and oropharynx Neck: normal jugular venous pulsations and no hepatojugular reflux; brisk carotid pulses without delay and no carotid bruits Chest: clear to auscultation, no signs of consolidation by percussion or palpation, normal fremitus, symmetrical and full respiratory excursions, sternotomy scar Cardiovascular: normal position and quality of the apical impulse, regular rhythm, normal first and second heart sounds with crisp mechanical prosthesis sounds, no murmurs, rubs or gallops Abdomen: no tenderness or distention, no masses by palpation, no abnormal pulsatility or arterial bruits, normal bowel sounds, no hepatosplenomegaly Extremities: no clubbing, cyanosis or edema; 2+ radial, ulnar and brachial pulses bilaterally; 2+ right femoral, posterior tibial and dorsalis pedis pulses; 2+ left femoral, posterior tibial and dorsalis pedis pulses; no subclavian or femoral bruits Neurological: grossly nonfocal Psych: euthymic mood, full affect   EKG:  EKG is ordered today. The ekg ordered today demonstrates sinus rhythm with PACs and first degree AV block - PR 250 ms   Recent Labs: No results found for requested labs within last 365 days.    Lipid Panel    Component Value Date/Time   CHOL  03/25/2009 0145    121        ATP III CLASSIFICATION:  <200     mg/dL    Desirable  469-629  mg/dL   Borderline High  >=528    mg/dL   High          TRIG 57 03/25/2009 0145   HDL 39* 03/25/2009 0145   CHOLHDL 3.1 03/25/2009 0145   VLDL 11 03/25/2009 0145   LDLCALC  03/25/2009 0145    71        Total Cholesterol/HDL:CHD Risk Coronary Heart Disease Risk Table                     Men   Women  1/2 Average Risk   3.4   3.3  Average Risk       5.0   4.4  2 X Average Risk   9.6   7.1  3 X Average Risk  23.4   11.0        Use the calculated Patient Ratio above and the CHD Risk Table to determine the patient's  CHD Risk.        ATP III CLASSIFICATION (LDL):  <100     mg/dL   Optimal  696-295  mg/dL   Near or Above                    Optimal  130-159  mg/dL   Borderline  284-132  mg/dL   High  >440     mg/dL   Very High      Wt Readings from Last 3 Encounters:  07/08/14 84.959 kg (187 lb 4.8 oz)  07/02/13 85.911 kg (189 lb 6.4 oz)  08/10/12 90.13 kg (198 lb 11.2 oz)      ASSESSMENT AND PLAN:  1. Dizziness/palpitations - I wonder whether she again had problems with hypokalemia (nausea, vomiting, diarrhea, diuretic therapy) or hypomagnesemia (chronic treatment with proton pump inhibitor). Will check labs including magnesium level today.  2. Status post mechanical aortic valve replacement with no cardiac symptoms to suggest valve dysfunction and no bleeding problems on chronic warfarin therapy. INR slightly supratherapeutic at 4.2 today, possibly also related to her recent episodes with vomiting and diarrhea.  3. HTN - controlled. If she again has hypokalemia will have to abandon the diuretic altogether consider switching from losartan to a more potent angiotensin receptor blocker.  4. Hyperlipidemia - will check labs when she has her collection lies checked as well.  5. CAD (coronary artery disease)  Incidentally noted total occlusion of the mid second oblique marginal artery by preoperative coronary angiography in 2006. No symptoms of angina. No  evidence of ischemia by nuclear stress testing. Repeat coronary angiography in March 2011 did not show any evidence of occlusion of the second oblique marginal branch. No symptoms to suggest active coronary insufficiency.   Current medicines are reviewed at length with the patient today.  The patient does not have concerns regarding medicines.  The following changes have been made:  no change  Labs/ tests ordered today include:  Orders Placed This Encounter  Procedures  . CBC  . Comprehensive metabolic panel  . Lipid panel  . EKG 12-Lead    Patient Instructions  Medication Instructions:   Continue same medications   Labwork:  FASTING at [facility]  Testing/Procedures:  None   Follow-Up:  One year         Joie Bimler, MD  07/08/2014 8:42 AM    Thurmon Fair, MD, [facility] [phone] office [phone] pager

## 2014-07-08 NOTE — Patient Instructions (Signed)
Medication Instructions:   Continue same medications   Labwork:  FASTING at [facility]  Testing/Procedures:  None   Follow-Up:  One year

## 2014-07-09 ENCOUNTER — Telehealth: Payer: Self-pay | Admitting: *Deleted

## 2014-07-09 ENCOUNTER — Encounter: Payer: Self-pay | Admitting: *Deleted

## 2014-07-09 NOTE — Telephone Encounter (Signed)
Pt. Walked in to office a few minutes ago states " physical would not do my treatment today " PT downstairs took pt. BP and got 132/100 pt. Did state she was weak but no weaker than usual. No other complaints. I took her BP and it was 140/80. I discussed this with Dr. Sallyanne Kuster and he agreed with me that it was ok for pt. To have physical therapy without delay. Pt. Given form letter stating this.

## 2014-07-09 NOTE — Progress Notes (Signed)
Results faxed to Dr. Marisue Humble.

## 2014-07-22 ENCOUNTER — Other Ambulatory Visit: Payer: Self-pay

## 2014-07-31 ENCOUNTER — Ambulatory Visit (INDEPENDENT_AMBULATORY_CARE_PROVIDER_SITE_OTHER): Payer: PPO | Admitting: Pharmacist Clinician (PhC)/ Clinical Pharmacy Specialist

## 2014-07-31 DIAGNOSIS — Z954 Presence of other heart-valve replacement: Secondary | ICD-10-CM

## 2014-07-31 DIAGNOSIS — Z7901 Long term (current) use of anticoagulants: Secondary | ICD-10-CM

## 2014-07-31 DIAGNOSIS — Z952 Presence of prosthetic heart valve: Secondary | ICD-10-CM

## 2014-07-31 LAB — POCT INR: INR: 4

## 2014-08-14 ENCOUNTER — Ambulatory Visit: Payer: PPO

## 2014-08-14 ENCOUNTER — Ambulatory Visit (INDEPENDENT_AMBULATORY_CARE_PROVIDER_SITE_OTHER): Payer: PPO | Admitting: Pharmacist Clinician (PhC)/ Clinical Pharmacy Specialist

## 2014-08-14 DIAGNOSIS — Z7901 Long term (current) use of anticoagulants: Secondary | ICD-10-CM | POA: Diagnosis not present

## 2014-08-14 DIAGNOSIS — Z954 Presence of other heart-valve replacement: Secondary | ICD-10-CM | POA: Diagnosis not present

## 2014-08-14 DIAGNOSIS — Z952 Presence of prosthetic heart valve: Secondary | ICD-10-CM

## 2014-08-14 LAB — POCT INR: INR: 2.5

## 2014-09-04 ENCOUNTER — Ambulatory Visit (INDEPENDENT_AMBULATORY_CARE_PROVIDER_SITE_OTHER): Payer: PPO | Admitting: Pharmacist Clinician (PhC)/ Clinical Pharmacy Specialist

## 2014-09-04 DIAGNOSIS — Z954 Presence of other heart-valve replacement: Secondary | ICD-10-CM

## 2014-09-04 DIAGNOSIS — Z7901 Long term (current) use of anticoagulants: Secondary | ICD-10-CM | POA: Diagnosis not present

## 2014-09-04 DIAGNOSIS — Z952 Presence of prosthetic heart valve: Secondary | ICD-10-CM

## 2014-09-04 LAB — POCT INR: INR: 3.3

## 2014-10-02 ENCOUNTER — Ambulatory Visit (INDEPENDENT_AMBULATORY_CARE_PROVIDER_SITE_OTHER): Payer: PPO | Admitting: Pharmacist Clinician (PhC)/ Clinical Pharmacy Specialist

## 2014-10-02 DIAGNOSIS — Z7901 Long term (current) use of anticoagulants: Secondary | ICD-10-CM | POA: Diagnosis not present

## 2014-10-02 DIAGNOSIS — Z952 Presence of prosthetic heart valve: Secondary | ICD-10-CM

## 2014-10-02 DIAGNOSIS — Z954 Presence of other heart-valve replacement: Secondary | ICD-10-CM | POA: Diagnosis not present

## 2014-10-02 LAB — POCT INR: INR: 3.2

## 2014-10-09 ENCOUNTER — Other Ambulatory Visit: Payer: Self-pay | Admitting: Cardiovascular Disease

## 2014-10-11 MED ORDER — WARFARIN SODIUM 5 MG PO TABS: ORAL_TABLET | ORAL | Status: DC

## 2014-11-13 ENCOUNTER — Ambulatory Visit (INDEPENDENT_AMBULATORY_CARE_PROVIDER_SITE_OTHER): Payer: PPO | Admitting: Pharmacist Clinician (PhC)/ Clinical Pharmacy Specialist

## 2014-11-13 DIAGNOSIS — Z954 Presence of other heart-valve replacement: Secondary | ICD-10-CM | POA: Diagnosis not present

## 2014-11-13 DIAGNOSIS — Z952 Presence of prosthetic heart valve: Secondary | ICD-10-CM

## 2014-11-13 DIAGNOSIS — Z7901 Long term (current) use of anticoagulants: Secondary | ICD-10-CM | POA: Diagnosis not present

## 2014-11-13 LAB — POCT INR: INR: 3

## 2014-12-30 ENCOUNTER — Ambulatory Visit (INDEPENDENT_AMBULATORY_CARE_PROVIDER_SITE_OTHER): Payer: PPO | Admitting: Pharmacist Clinician (PhC)/ Clinical Pharmacy Specialist

## 2014-12-30 DIAGNOSIS — Z7901 Long term (current) use of anticoagulants: Secondary | ICD-10-CM

## 2014-12-30 DIAGNOSIS — Z952 Presence of prosthetic heart valve: Secondary | ICD-10-CM

## 2014-12-30 DIAGNOSIS — Z954 Presence of other heart-valve replacement: Secondary | ICD-10-CM

## 2014-12-30 LAB — POCT INR: INR: 2.7

## 2015-01-06 ENCOUNTER — Other Ambulatory Visit: Payer: Self-pay | Admitting: Cardiovascular Disease

## 2015-02-10 ENCOUNTER — Ambulatory Visit (INDEPENDENT_AMBULATORY_CARE_PROVIDER_SITE_OTHER): Payer: PPO | Admitting: Pharmacist Clinician (PhC)/ Clinical Pharmacy Specialist

## 2015-02-10 DIAGNOSIS — Z7901 Long term (current) use of anticoagulants: Secondary | ICD-10-CM | POA: Diagnosis not present

## 2015-02-10 DIAGNOSIS — Z954 Presence of other heart-valve replacement: Secondary | ICD-10-CM | POA: Diagnosis not present

## 2015-02-10 DIAGNOSIS — Z952 Presence of prosthetic heart valve: Secondary | ICD-10-CM

## 2015-02-10 LAB — POCT INR: INR: 2.7

## 2015-03-11 DIAGNOSIS — M899 Disorder of bone, unspecified: Secondary | ICD-10-CM | POA: Diagnosis not present

## 2015-03-11 DIAGNOSIS — I359 Nonrheumatic aortic valve disorder, unspecified: Secondary | ICD-10-CM | POA: Diagnosis not present

## 2015-03-11 DIAGNOSIS — I10 Essential (primary) hypertension: Secondary | ICD-10-CM | POA: Diagnosis not present

## 2015-03-11 DIAGNOSIS — I2510 Atherosclerotic heart disease of native coronary artery without angina pectoris: Secondary | ICD-10-CM | POA: Diagnosis not present

## 2015-03-11 DIAGNOSIS — K219 Gastro-esophageal reflux disease without esophagitis: Secondary | ICD-10-CM | POA: Diagnosis not present

## 2015-03-11 DIAGNOSIS — E7800 Pure hypercholesterolemia, unspecified: Secondary | ICD-10-CM | POA: Diagnosis not present

## 2015-03-24 ENCOUNTER — Ambulatory Visit (INDEPENDENT_AMBULATORY_CARE_PROVIDER_SITE_OTHER): Payer: PPO | Admitting: Pharmacist Clinician (PhC)/ Clinical Pharmacy Specialist

## 2015-03-24 VITALS — BP 132/80 | HR 64

## 2015-03-24 DIAGNOSIS — Z7901 Long term (current) use of anticoagulants: Secondary | ICD-10-CM | POA: Diagnosis not present

## 2015-03-24 DIAGNOSIS — Z954 Presence of other heart-valve replacement: Secondary | ICD-10-CM

## 2015-03-24 DIAGNOSIS — Z952 Presence of prosthetic heart valve: Secondary | ICD-10-CM

## 2015-03-24 LAB — POCT INR: INR: 2.4

## 2015-05-02 ENCOUNTER — Ambulatory Visit (INDEPENDENT_AMBULATORY_CARE_PROVIDER_SITE_OTHER): Payer: PPO | Admitting: Pharmacist Clinician (PhC)/ Clinical Pharmacy Specialist

## 2015-05-02 DIAGNOSIS — Z954 Presence of other heart-valve replacement: Secondary | ICD-10-CM

## 2015-05-02 DIAGNOSIS — Z952 Presence of prosthetic heart valve: Secondary | ICD-10-CM

## 2015-05-02 DIAGNOSIS — Z7901 Long term (current) use of anticoagulants: Secondary | ICD-10-CM

## 2015-05-02 LAB — POCT INR: INR: 3.2

## 2015-06-09 ENCOUNTER — Telehealth: Payer: Self-pay | Admitting: Cardiovascular Disease

## 2015-06-09 NOTE — Telephone Encounter (Signed)
New Message  Pt c/o medication issue: 1. Name of Medication: Crestor    4. What is your medication issue? Pt called states that her insurance will no longer cover crestor. Please call back to discuss.   Comments: Pt has an appt scheduled with Dr. Loletha Grayer on 07/31/2015 at 8:15a.

## 2015-06-09 NOTE — Telephone Encounter (Signed)
Pt going to review her drug catalog and let us know medications that would be approved for her to take. Pt stated she see Erasmo Downer on Friday this week and will bring information with her. Told pt would forward to Erasmo Downer so she is aware the question is coming, as a medication adjustment might be able to be made that is covered.  Pt verbalized understanding, no additional questions at this time.

## 2015-06-13 ENCOUNTER — Ambulatory Visit (INDEPENDENT_AMBULATORY_CARE_PROVIDER_SITE_OTHER): Payer: PPO | Admitting: Pharmacist

## 2015-06-13 DIAGNOSIS — Z7901 Long term (current) use of anticoagulants: Secondary | ICD-10-CM

## 2015-06-13 DIAGNOSIS — Z954 Presence of other heart-valve replacement: Secondary | ICD-10-CM | POA: Diagnosis not present

## 2015-06-13 DIAGNOSIS — Z952 Presence of prosthetic heart valve: Secondary | ICD-10-CM

## 2015-06-13 LAB — POCT INR: INR: 2.8

## 2015-06-15 ENCOUNTER — Other Ambulatory Visit: Payer: Self-pay | Admitting: Cardiovascular Disease

## 2015-07-17 ENCOUNTER — Telehealth: Payer: Self-pay | Admitting: *Deleted

## 2015-07-17 NOTE — Telephone Encounter (Signed)
called about her crestor, she said her insurance will not pay for it, please call & discuss with her alternatives that insurance will cover, thanks

## 2015-07-18 MED ORDER — ATORVASTATIN CALCIUM 10 MG PO TABS: 10.0000 mg | ORAL_TABLET | Freq: Every day | ORAL | Status: DC

## 2015-07-18 NOTE — Telephone Encounter (Signed)
Spoke with patient, insurance will cover atorvastatin at no charge, but rosuvastatin for $24/month.   Would like to try atorva. Was taking 5 mg of Crestor so will give her 10 mg atorvastatin, sent to pharmacy.  Pt voiced understanding

## 2015-07-31 ENCOUNTER — Encounter: Payer: Self-pay | Admitting: Cardiovascular Disease

## 2015-07-31 ENCOUNTER — Encounter (INDEPENDENT_AMBULATORY_CARE_PROVIDER_SITE_OTHER): Payer: PPO

## 2015-07-31 ENCOUNTER — Ambulatory Visit (INDEPENDENT_AMBULATORY_CARE_PROVIDER_SITE_OTHER): Payer: PPO | Admitting: Pharmacist Clinician (PhC)/ Clinical Pharmacy Specialist

## 2015-07-31 ENCOUNTER — Ambulatory Visit (INDEPENDENT_AMBULATORY_CARE_PROVIDER_SITE_OTHER): Payer: PPO | Admitting: Cardiovascular Disease

## 2015-07-31 VITALS — BP 140/88 | HR 67 | Ht 67.0 in | Wt 191.0 lb

## 2015-07-31 DIAGNOSIS — Z7901 Long term (current) use of anticoagulants: Secondary | ICD-10-CM

## 2015-07-31 DIAGNOSIS — Z952 Presence of prosthetic heart valve: Secondary | ICD-10-CM

## 2015-07-31 DIAGNOSIS — Z954 Presence of other heart-valve replacement: Secondary | ICD-10-CM

## 2015-07-31 DIAGNOSIS — I10 Essential (primary) hypertension: Secondary | ICD-10-CM | POA: Diagnosis not present

## 2015-07-31 DIAGNOSIS — E785 Hyperlipidemia, unspecified: Secondary | ICD-10-CM | POA: Diagnosis not present

## 2015-07-31 DIAGNOSIS — R55 Syncope and collapse: Secondary | ICD-10-CM | POA: Diagnosis not present

## 2015-07-31 LAB — POCT INR: INR: 2.7

## 2015-07-31 NOTE — Patient Instructions (Signed)
Medication Instructions: Dr Sallyanne Kuster recommends that you continue on your current medications as directed. Please refer to the Current Medication list given to you today.  Labwork: NONE ORDERED  Testing/Procedures: 1. 30-Day Event Monitor - Your physician has recommended that you wear an event monitor. Event monitors are medical devices that record the heart's electrical activity. Doctors most often [place] these monitors to diagnose arrhythmias. Arrhythmias are problems with the speed or rhythm of the heartbeat. The monitor is a small, portable device. You can wear one while you do your normal daily activities. This is usually used to diagnose what is causing palpitations/syncope (passing out).  2. Echocardiogram - Your physician has requested that you have an echocardiogram. Echocardiography is a painless test that uses sound waves to create images of your heart. It provides your doctor with information about the size and shape of your heart and how well your heart's chambers and valves are working. This procedure takes approximately one hour. There are no restrictions for this procedure. This will be performed at our [facility] location - [address].  Follow-up: Dr Sallyanne Kuster recommends that you schedule a follow-up appointment in 1 year. You will receive a reminder letter in the mail two months in advance. If you don't receive a letter, please call our office to schedule the follow-up appointment.  If you need a refill on your cardiac medications before your next appointment, please call your pharmacy.

## 2015-07-31 NOTE — Progress Notes (Signed)
Cardiology Office Note    Date:  07/31/2015   ID:  Sharon Rose, DOB [birth year], MRN [id number]  PCP:  Thora Lance, MD  Cardiologist:   Thurmon Fair, MD   Chief Complaint  Patient presents with  . Follow-up    pt c/o swelling in both legs    History of Present Illness:  Sharon Rose is a 75 y.o. female who presents for f/u of mechanical AVR and hyperlipidemia and HTN.  Sharon Rose  had replacement of a severely stenotic, probably congenitally bicuspid aortic valve in 2006 with a 23 mm mechanical valve. She has not had any problems related to the prosthetic valve since that time. She has HTN and dyslipidemia that is treated with a statin. Coronary angiography before surgery showed total occlusion of the mid second oblique marginal artery by preoperative coronary angiography in 2006. No symptoms of angina. No evidence of ischemia by nuclear stress testing. Repeat coronary angiography in March 2011 did not confirm occlusion of the second oblique marginal branch. She has normal LV function and no history of embolic events. In 2014 she presented with thiazide induced hypokalemia and bradycardia. Her heart rate normalized after electrolyte correction. We tried to stop her diuretic completely, but this was associated with ankle swelling and difficult to control blood pressure. She has a history of postoperative atrial fibrillation without detected recurrence since then.  She has done well and is physically active. 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 Her memory is slightly worse. She has had a couple of episodes of on heralded near-syncope and blurry vision, not associated with changes in position or a noticed blood pressure change.  Past Medical History  Diagnosis Date  . H/O aortic valve replacement     2D ECHO, 04/26/2011 - EF 50-55%, normal-mild  . CAD (coronary artery disease)     NUCLEAR STRESS TEST, 10/07/2008 - no ischemia  . Dyspnea on exertion   .  Hypertension     Past Surgical History  Procedure Laterality Date  . Cardiac catheterization Left 03/25/2009    No recommendations-normal findings  . Cardiac catheterization  01/20/2005    Medical management  . Cardiac catheterization  08/11/2004    Elective aortic valve replacement, severe aortic valvre stenosis with functional class II to III symptomatology  . Aortic valve replacement  09/12/2004    St. Jude mechanical aortic valve, model #23AK-501, serial [id number]    Current Medications: Outpatient Prescriptions Prior to Visit  Medication Sig Dispense Refill  . acetaminophen (TYLENOL) 500 MG tablet Take 1,000 mg by mouth every 6 (six) hours as needed.    Marland Kitchen amLODipine (NORVASC) 5 MG tablet Take 5 mg by mouth daily.    Marland Kitchen aspirin EC 81 MG tablet Take 81 mg by mouth daily.    Marland Kitchen atorvastatin (LIPITOR) 10 MG tablet Take 1 tablet (10 mg total) by mouth daily. 30 tablet 3  . calcium carbonate (OS-CAL) 600 MG TABS Take 600 mg by mouth 2 (two) times daily with a meal.    . losartan (COZAAR) 100 MG tablet Take 1 tablet (100 mg total) by mouth daily. 30 tablet 5  . Multiple Vitamin (MULTIVITAMIN) tablet Take 1 tablet by mouth daily.    Marland Kitchen omeprazole (PRILOSEC) 20 MG capsule Take 20 mg by mouth daily.    Marland Kitchen warfarin (COUMADIN) 5 MG tablet TAKE 1 TO 1.5 TABLETS BY MOUTH DAILY AS DIRECTED BY COUMADIN CLINIC 120 tablet 1  . hydrochlorothiazide (MICROZIDE) 12.5  MG capsule Take Mondays/Wednesday/Fridays (Patient not taking: Reported on 07/31/2015) 36 capsule 1   No facility-administered medications prior to visit.     Allergies:   Codeine; Statins; Sulfa antibiotics; and Verapamil   Social History   Social History  . Marital Status: Married    Spouse Name: N/A  . Number of Children: N/A  . Years of Education: N/A   Social History Main Topics  . Smoking status: Never Smoker   . Smokeless tobacco: Never Used  . Alcohol Use: No  . Drug Use: No  . Sexual Activity: Not Asked   Other Topics  Concern  . None   Social History Narrative      ROS:   Please see the history of present illness.    ROS All other systems reviewed and are negative.   PHYSICAL EXAM:   VS:  BP 140/88 mmHg  Pulse 67  Ht 5\' 7"  (1.702 m)  Wt 86.637 kg (191 lb)  BMI 29.91 kg/m2  SpO2 96%   GEN: Well nourished, well developed, in no acute distress HEENT: normal Neck: no JVD, carotid bruits, or masses Cardiac: Crisp mechanical valve clicks, RRR; no murmurs, rubs, or gallops,no edema  Respiratory:  clear to auscultation bilaterally, normal work of breathing GI: soft, nontender, nondistended, + BS MS: no deformity or atrophy Skin: warm and dry, no rash Neuro:  Alert and Oriented x 3, Strength and sensation are intact Psych: euthymic mood, full affect  Wt Readings from Last 3 Encounters:  07/31/15 86.637 kg (191 lb)  07/08/14 84.959 kg (187 lb 4.8 oz)  07/02/13 85.911 kg (189 lb 6.4 oz)      Studies/Labs Reviewed:   EKG:  EKG is ordered today.  The ekg ordered today demonstrates Sinus rhythm with first-degree AV block at 262 ms, occasional PVCs, nonspecific ST and T-wave changes, QTc 454 ms  Recent Labs: No results found for requested labs within last 365 days.   Lipid Panel Earlier this year cholesterol 161, triglycerides 112, HDL 53, LDL 85 Creatinine 1.61, normal liver function tests and electrolytes   ASSESSMENT:    1. H/O mechanical aortic valve replacement   2. Pre-syncope   3. Essential hypertension   4. Hyperlipidemia   5. Long term (current) use of anticoagulants      PLAN:  In order of problems listed above:  1. S/p AVR: Doubt that her presyncopal symptoms are related to aortic valve dysfunction, but is probably worthwhile reevaluating prosthetic valve gradients 2. Near-syncope: The unexpected and unheralded pattern of her symptoms with abrupt onset and abrupt resolution suggest possible arrhythmia. Her EKG shows evidence of AV node conduction abnormalities and she has  a history of postoperative atrial fibrillation, neither one of which could explain her symptoms. Will request an event monitor. 3. HTN: fair control 4. HLP: Satisfactory lipid profile on statin 5. Excellent prothrombin time levels, no bleeding complications or history of embolic events. 6. CAD: Previous catheterization reported total occlusion of the mid second oblique marginal artery in 2006, but repeat angiography in 2011 did not confirm this abnormality. She has never had angina pectoris.    Medication Adjustments/Labs and Tests Ordered: Current medicines are reviewed at length with the patient today.  Concerns regarding medicines are outlined above.  Medication changes, Labs and Tests ordered today are listed in the Patient Instructions below. Patient Instructions  Medication Instructions: Dr Royann Shivers recommends that you continue on your current medications as directed. Please refer to the Current Medication list given to you today.  Labwork: NONE ORDERED  Testing/Procedures: 1. 30-Day Event Monitor - Your physician has recommended that you wear an event monitor. Event monitors are medical devices that record the heart's electrical activity. Doctors most often [place] these monitors to diagnose arrhythmias. Arrhythmias are problems with the speed or rhythm of the heartbeat. The monitor is a small, portable device. You can wear one while you do your normal daily activities. This is usually used to diagnose what is causing palpitations/syncope (passing out).  2. Echocardiogram - Your physician has requested that you have an echocardiogram. Echocardiography is a painless test that uses sound waves to create images of your heart. It provides your doctor with information about the size and shape of your heart and how well your heart's chambers and valves are working. This procedure takes approximately one hour. There are no restrictions for this procedure. This will be performed at our [facility]  location - [address], [address].  Follow-up: Dr Royann Shivers recommends that you schedule a follow-up appointment in 1 year. You will receive a reminder letter in the mail two months in advance. If you don't receive a letter, please call our office to schedule the follow-up appointment.  If you need a refill on your cardiac medications before your next appointment, please call your pharmacy.     Signed, Thurmon Fair, MD  07/31/2015 6:39 PM    [facility] [address] [place], [place], [place]  [address] Phone: [phone]; Fax: [phone]

## 2015-08-01 ENCOUNTER — Encounter: Payer: Self-pay | Admitting: Family Medicine

## 2015-08-08 ENCOUNTER — Telehealth: Payer: Self-pay | Admitting: Cardiovascular Disease

## 2015-08-08 DIAGNOSIS — T148 Other injury of unspecified body region: Secondary | ICD-10-CM | POA: Diagnosis not present

## 2015-08-08 DIAGNOSIS — W5501XA Bitten by cat, initial encounter: Secondary | ICD-10-CM | POA: Diagnosis not present

## 2015-08-08 NOTE — Telephone Encounter (Signed)
Pt is wearing event monitor. She is going out of town next week for 3 days.She wants to know if she can not wear it for those days?

## 2015-08-08 NOTE — Telephone Encounter (Signed)
Patient called in wanting to know if she can take her monitor off for a trip to the [facility]. She states the monitor only holds a charge for about 23 hours and she is unsure she will be able to keep it charged. Informed patient that the monitors are to be changed every 12 hours (with one being charged when not in use). She states she was unaware of this but this would make it more convenient for her to wear the monitor. She asked questions about the phone, etc. All questions were answered to patient's satisfaction. No further action needed.

## 2015-08-18 ENCOUNTER — Other Ambulatory Visit (HOSPITAL_COMMUNITY): Payer: Self-pay

## 2015-08-18 ENCOUNTER — Ambulatory Visit (HOSPITAL_COMMUNITY): Payer: PPO | Attending: Internal Medicine

## 2015-08-18 DIAGNOSIS — I119 Hypertensive heart disease without heart failure: Secondary | ICD-10-CM | POA: Diagnosis not present

## 2015-08-18 DIAGNOSIS — Z954 Presence of other heart-valve replacement: Secondary | ICD-10-CM

## 2015-08-18 DIAGNOSIS — Z952 Presence of prosthetic heart valve: Secondary | ICD-10-CM

## 2015-08-18 DIAGNOSIS — E785 Hyperlipidemia, unspecified: Secondary | ICD-10-CM | POA: Insufficient documentation

## 2015-08-18 DIAGNOSIS — I2510 Atherosclerotic heart disease of native coronary artery without angina pectoris: Secondary | ICD-10-CM | POA: Insufficient documentation

## 2015-08-18 DIAGNOSIS — R55 Syncope and collapse: Secondary | ICD-10-CM | POA: Diagnosis not present

## 2015-08-18 DIAGNOSIS — I351 Nonrheumatic aortic (valve) insufficiency: Secondary | ICD-10-CM | POA: Insufficient documentation

## 2015-08-18 DIAGNOSIS — Z953 Presence of xenogenic heart valve: Secondary | ICD-10-CM | POA: Insufficient documentation

## 2015-08-29 DIAGNOSIS — R55 Syncope and collapse: Secondary | ICD-10-CM

## 2015-09-02 DIAGNOSIS — H25813 Combined forms of age-related cataract, bilateral: Secondary | ICD-10-CM | POA: Diagnosis not present

## 2015-09-02 DIAGNOSIS — H04123 Dry eye syndrome of bilateral lacrimal glands: Secondary | ICD-10-CM | POA: Diagnosis not present

## 2015-09-03 DIAGNOSIS — Z1231 Encounter for screening mammogram for malignant neoplasm of breast: Secondary | ICD-10-CM | POA: Diagnosis not present

## 2015-09-03 DIAGNOSIS — Z803 Family history of malignant neoplasm of breast: Secondary | ICD-10-CM | POA: Diagnosis not present

## 2015-09-09 DIAGNOSIS — E7800 Pure hypercholesterolemia, unspecified: Secondary | ICD-10-CM | POA: Diagnosis not present

## 2015-09-09 DIAGNOSIS — I359 Nonrheumatic aortic valve disorder, unspecified: Secondary | ICD-10-CM | POA: Diagnosis not present

## 2015-09-09 DIAGNOSIS — K219 Gastro-esophageal reflux disease without esophagitis: Secondary | ICD-10-CM | POA: Diagnosis not present

## 2015-09-09 DIAGNOSIS — M899 Disorder of bone, unspecified: Secondary | ICD-10-CM | POA: Diagnosis not present

## 2015-09-09 DIAGNOSIS — I10 Essential (primary) hypertension: Secondary | ICD-10-CM | POA: Diagnosis not present

## 2015-09-09 DIAGNOSIS — I2510 Atherosclerotic heart disease of native coronary artery without angina pectoris: Secondary | ICD-10-CM | POA: Diagnosis not present

## 2015-09-12 ENCOUNTER — Ambulatory Visit (INDEPENDENT_AMBULATORY_CARE_PROVIDER_SITE_OTHER): Payer: PPO | Admitting: Pharmacist

## 2015-09-12 DIAGNOSIS — Z954 Presence of other heart-valve replacement: Secondary | ICD-10-CM | POA: Diagnosis not present

## 2015-09-12 DIAGNOSIS — Z952 Presence of prosthetic heart valve: Secondary | ICD-10-CM

## 2015-09-12 DIAGNOSIS — Z7901 Long term (current) use of anticoagulants: Secondary | ICD-10-CM

## 2015-09-12 LAB — POCT INR: INR: 2.7

## 2015-09-26 DIAGNOSIS — H2512 Age-related nuclear cataract, left eye: Secondary | ICD-10-CM | POA: Diagnosis not present

## 2015-10-08 DIAGNOSIS — H2511 Age-related nuclear cataract, right eye: Secondary | ICD-10-CM | POA: Diagnosis not present

## 2015-10-13 DIAGNOSIS — H2511 Age-related nuclear cataract, right eye: Secondary | ICD-10-CM | POA: Diagnosis not present

## 2015-10-24 ENCOUNTER — Ambulatory Visit (INDEPENDENT_AMBULATORY_CARE_PROVIDER_SITE_OTHER): Payer: PPO | Admitting: Pharmacist Clinician (PhC)/ Clinical Pharmacy Specialist

## 2015-10-24 DIAGNOSIS — Z7901 Long term (current) use of anticoagulants: Secondary | ICD-10-CM | POA: Diagnosis not present

## 2015-10-24 DIAGNOSIS — Z954 Presence of other heart-valve replacement: Secondary | ICD-10-CM

## 2015-10-24 DIAGNOSIS — Z952 Presence of prosthetic heart valve: Secondary | ICD-10-CM

## 2015-10-24 LAB — POCT INR: INR: 2.7

## 2015-11-17 ENCOUNTER — Other Ambulatory Visit: Payer: Self-pay | Admitting: Cardiovascular Disease

## 2015-12-05 ENCOUNTER — Ambulatory Visit (INDEPENDENT_AMBULATORY_CARE_PROVIDER_SITE_OTHER): Payer: PPO | Admitting: Pharmacist

## 2015-12-05 DIAGNOSIS — Z952 Presence of prosthetic heart valve: Secondary | ICD-10-CM | POA: Diagnosis not present

## 2015-12-05 DIAGNOSIS — Z7901 Long term (current) use of anticoagulants: Secondary | ICD-10-CM | POA: Diagnosis not present

## 2015-12-05 LAB — POCT INR: INR: 2.6

## 2016-01-06 DIAGNOSIS — Z9189 Other specified personal risk factors, not elsewhere classified: Secondary | ICD-10-CM | POA: Diagnosis not present

## 2016-01-21 ENCOUNTER — Ambulatory Visit (INDEPENDENT_AMBULATORY_CARE_PROVIDER_SITE_OTHER): Payer: PPO | Admitting: Pharmacist

## 2016-01-21 DIAGNOSIS — Z7901 Long term (current) use of anticoagulants: Secondary | ICD-10-CM

## 2016-01-21 DIAGNOSIS — Z952 Presence of prosthetic heart valve: Secondary | ICD-10-CM | POA: Diagnosis not present

## 2016-01-21 LAB — POCT INR: INR: 2.7

## 2016-02-19 DIAGNOSIS — L814 Other melanin hyperpigmentation: Secondary | ICD-10-CM | POA: Diagnosis not present

## 2016-02-19 DIAGNOSIS — D235 Other benign neoplasm of skin of trunk: Secondary | ICD-10-CM | POA: Diagnosis not present

## 2016-02-19 DIAGNOSIS — L739 Follicular disorder, unspecified: Secondary | ICD-10-CM | POA: Diagnosis not present

## 2016-02-19 DIAGNOSIS — L821 Other seborrheic keratosis: Secondary | ICD-10-CM | POA: Diagnosis not present

## 2016-02-19 DIAGNOSIS — L820 Inflamed seborrheic keratosis: Secondary | ICD-10-CM | POA: Diagnosis not present

## 2016-03-03 ENCOUNTER — Ambulatory Visit (INDEPENDENT_AMBULATORY_CARE_PROVIDER_SITE_OTHER): Payer: PPO | Admitting: Pharmacist

## 2016-03-03 DIAGNOSIS — Z7901 Long term (current) use of anticoagulants: Secondary | ICD-10-CM

## 2016-03-03 DIAGNOSIS — Z952 Presence of prosthetic heart valve: Secondary | ICD-10-CM | POA: Diagnosis not present

## 2016-03-03 LAB — POCT INR: INR: 2.9

## 2016-03-15 DIAGNOSIS — R209 Unspecified disturbances of skin sensation: Secondary | ICD-10-CM | POA: Diagnosis not present

## 2016-03-15 DIAGNOSIS — I359 Nonrheumatic aortic valve disorder, unspecified: Secondary | ICD-10-CM | POA: Diagnosis not present

## 2016-03-15 DIAGNOSIS — E7800 Pure hypercholesterolemia, unspecified: Secondary | ICD-10-CM | POA: Diagnosis not present

## 2016-03-15 DIAGNOSIS — M899 Disorder of bone, unspecified: Secondary | ICD-10-CM | POA: Diagnosis not present

## 2016-03-15 DIAGNOSIS — I10 Essential (primary) hypertension: Secondary | ICD-10-CM | POA: Diagnosis not present

## 2016-03-15 DIAGNOSIS — R5383 Other fatigue: Secondary | ICD-10-CM | POA: Diagnosis not present

## 2016-03-15 DIAGNOSIS — K219 Gastro-esophageal reflux disease without esophagitis: Secondary | ICD-10-CM | POA: Diagnosis not present

## 2016-03-15 DIAGNOSIS — I2510 Atherosclerotic heart disease of native coronary artery without angina pectoris: Secondary | ICD-10-CM | POA: Diagnosis not present

## 2016-03-16 DIAGNOSIS — R350 Frequency of micturition: Secondary | ICD-10-CM | POA: Diagnosis not present

## 2016-03-16 DIAGNOSIS — N3000 Acute cystitis without hematuria: Secondary | ICD-10-CM | POA: Diagnosis not present

## 2016-04-14 ENCOUNTER — Ambulatory Visit (INDEPENDENT_AMBULATORY_CARE_PROVIDER_SITE_OTHER): Payer: PPO | Admitting: Pharmacist Clinician (PhC)/ Clinical Pharmacy Specialist

## 2016-04-14 DIAGNOSIS — Z7901 Long term (current) use of anticoagulants: Secondary | ICD-10-CM | POA: Diagnosis not present

## 2016-04-14 DIAGNOSIS — Z952 Presence of prosthetic heart valve: Secondary | ICD-10-CM | POA: Diagnosis not present

## 2016-04-14 LAB — POCT INR: INR: 2.6

## 2016-04-21 ENCOUNTER — Other Ambulatory Visit: Payer: Self-pay | Admitting: Cardiovascular Disease

## 2016-05-26 ENCOUNTER — Ambulatory Visit (INDEPENDENT_AMBULATORY_CARE_PROVIDER_SITE_OTHER): Payer: PPO | Admitting: Pharmacist

## 2016-05-26 DIAGNOSIS — Z7901 Long term (current) use of anticoagulants: Secondary | ICD-10-CM | POA: Diagnosis not present

## 2016-05-26 DIAGNOSIS — Z952 Presence of prosthetic heart valve: Secondary | ICD-10-CM

## 2016-05-26 LAB — POCT INR: INR: 3.2

## 2016-07-07 ENCOUNTER — Ambulatory Visit (INDEPENDENT_AMBULATORY_CARE_PROVIDER_SITE_OTHER): Payer: PPO | Admitting: Pharmacist Clinician (PhC)/ Clinical Pharmacy Specialist

## 2016-07-07 DIAGNOSIS — Z952 Presence of prosthetic heart valve: Secondary | ICD-10-CM | POA: Diagnosis not present

## 2016-07-07 DIAGNOSIS — Z7901 Long term (current) use of anticoagulants: Secondary | ICD-10-CM | POA: Diagnosis not present

## 2016-07-07 LAB — POCT INR: INR: 3.3

## 2016-09-03 DIAGNOSIS — Z1231 Encounter for screening mammogram for malignant neoplasm of breast: Secondary | ICD-10-CM | POA: Diagnosis not present

## 2016-09-10 ENCOUNTER — Ambulatory Visit (INDEPENDENT_AMBULATORY_CARE_PROVIDER_SITE_OTHER): Payer: PPO | Admitting: Cardiovascular Disease

## 2016-09-10 ENCOUNTER — Ambulatory Visit (INDEPENDENT_AMBULATORY_CARE_PROVIDER_SITE_OTHER): Payer: PPO | Admitting: Pharmacist

## 2016-09-10 ENCOUNTER — Encounter: Payer: Self-pay | Admitting: Cardiovascular Disease

## 2016-09-10 VITALS — BP 130/82 | HR 62 | Ht 67.0 in | Wt 192.0 lb

## 2016-09-10 DIAGNOSIS — I10 Essential (primary) hypertension: Secondary | ICD-10-CM | POA: Diagnosis not present

## 2016-09-10 DIAGNOSIS — I2510 Atherosclerotic heart disease of native coronary artery without angina pectoris: Secondary | ICD-10-CM | POA: Diagnosis not present

## 2016-09-10 DIAGNOSIS — Z952 Presence of prosthetic heart valve: Secondary | ICD-10-CM

## 2016-09-10 DIAGNOSIS — E7800 Pure hypercholesterolemia, unspecified: Secondary | ICD-10-CM | POA: Diagnosis not present

## 2016-09-10 DIAGNOSIS — Z7901 Long term (current) use of anticoagulants: Secondary | ICD-10-CM

## 2016-09-10 LAB — POCT INR: INR: 3.7

## 2016-09-10 NOTE — Patient Instructions (Signed)
Dr Croitoru recommends that you schedule a follow-up appointment in 12 months. You will receive a reminder letter in the mail two months in advance. If you don't receive a letter, please call our office to schedule the follow-up appointment.  If you need a refill on your cardiac medications before your next appointment, please call your pharmacy. 

## 2016-09-10 NOTE — Progress Notes (Signed)
Cardiology Office Note    Date:  09/10/2016   ID:  SHAWNELL GLEW, DOB 07-19-1940, MRN [id number]  PCP:  Blair Heys, MD  Cardiologist:   Thurmon Fair, MD   Chief Complaint  Patient presents with  . Follow-up    1 year.  . Shortness of Breath  . Edema    History of Present Illness:  Sharon Rose is a 76 y.o. female who presents for f/u of mechanical AVR and hyperlipidemia and HTN.  She generally feels well and denies problems with palpitations, dizziness, syncope, angina at rest with activity. She does complain of shortness of breath and fatigue. She generally has to rest after vacuuming one room or after climbing one long flight of stairs. She also has leg edema towards the end of the day, never more than halfway up her shins. She has varicose veins.  She underwent an echocardiogram a year ago that showed a mean aortic valve gradient of 18 mmHg. She had some complaints of dizziness and her 30 day event monitor showed normal findings other than frequent PACs. Her anticoagulation levels are consistently in therapeutic range, occasionally too high, never low. She has not had any bleeding problems or embolic events. She does not have a history of stroke or TIA.  Sharon Rose  had replacement of a severely stenotic, probably congenitally bicuspid aortic valve in 2006 with a 23 mm mechanical valve. She has not had any problems related to the prosthetic valve since that time. She has HTN and dyslipidemia that is treated with a statin. Coronary angiography before surgery showed total occlusion of the mid second oblique marginal artery by preoperative coronary angiography in 2006, but no angina and no evidence of ischemia by nuclear stress testing. Repeat coronary angiography in March 2011 did not confirm occlusion of the second oblique marginal branch. She has normal LV function and no history of embolic events. In 2014 she presented with thiazide induced hypokalemia and bradycardia. Her  heart rate normalized after electrolyte correction. We tried to stop her diuretic completely, but this was associated with ankle swelling and difficult to control blood pressure. She has a history of postoperative atrial fibrillation without detected recurrence since then.  Her last echo in July 2017 showed normal left ventricular systolic function, mild LVH with stage I diastolic dysfunction, aortic valve prosthesis gradients of 33 and 18 mmHg respectively (slightly increased compared with July 2014: 23 and 50 mmHg). Normal 30 day event monitor in July 2017.  Past Medical History:  Diagnosis Date  . CAD (coronary artery disease)    NUCLEAR STRESS TEST, 10/07/2008 - no ischemia  . Dyspnea on exertion   . H/O aortic valve replacement    2D ECHO, 04/26/2011 - EF 50-55%, normal-mild  . Hypertension     Past Surgical History:  Procedure Laterality Date  . AORTIC VALVE REPLACEMENT  09/12/2004   St. Jude mechanical aortic valve, model #23AK-501, serial [id number]  . CARDIAC CATHETERIZATION Left 03/25/2009   No recommendations-normal findings  . CARDIAC CATHETERIZATION  01/20/2005   Medical management  . CARDIAC CATHETERIZATION  08/11/2004   Elective aortic valve replacement, severe aortic valvre stenosis with functional class II to III symptomatology    Current Medications: Outpatient Medications Prior to Visit  Medication Sig Dispense Refill  . acetaminophen (TYLENOL) 500 MG tablet Take 1,000 mg by mouth every 6 (six) hours as needed.    Marland Kitchen amLODipine (NORVASC) 5 MG tablet Take 5 mg by mouth daily.    Marland Kitchen aspirin  EC 81 MG tablet Take 81 mg by mouth daily.    Marland Kitchen atorvastatin (LIPITOR) 10 MG tablet Take 1 tablet (10 mg total) by mouth daily. 30 tablet 3  . calcium carbonate (OS-CAL) 600 MG TABS Take 600 mg by mouth 2 (two) times daily with a meal.    . losartan (COZAAR) 100 MG tablet Take 1 tablet (100 mg total) by mouth daily. 30 tablet 5  . Multiple Vitamin (MULTIVITAMIN) tablet Take 1 tablet by  mouth daily.    Marland Kitchen omeprazole (PRILOSEC) 20 MG capsule Take 20 mg by mouth daily.    Marland Kitchen warfarin (COUMADIN) 5 MG tablet TAKE 1 TO 1.5 TABLETS BY MOUTH EVERY DAY AS DIRECTED BY COUMADIN CLINIC 130 tablet 1   No facility-administered medications prior to visit.      Allergies:   Codeine; Statins; Sulfa antibiotics; and Verapamil   Social History   Social History  . Marital status: Married    Spouse name: N/A  . Number of children: N/A  . Years of education: N/A   Social History Main Topics  . Smoking status: Never Smoker  . Smokeless tobacco: Never Used  . Alcohol use No  . Drug use: No  . Sexual activity: Not Asked   Other Topics Concern  . None   Social History Narrative  . None      ROS:   Please see the history of present illness.    ROS All other systems reviewed and are negative.   PHYSICAL EXAM:   VS:  BP 130/82   Pulse 62   Ht 5\' 7"  (1.702 m)   Wt 192 lb (87.1 kg)   BMI 30.07 kg/m     General: Borderline obese, well developed, in no acute distress  Head: no evidence of trauma, PERRL, EOMI, no exophtalmos or lid lag, no myxedema, no xanthelasma; normal ears, nose and oropharynx Neck: normal jugular venous pulsations and no hepatojugular reflux; brisk carotid pulses without delay and no carotid bruits Chest: clear to auscultation, no signs of consolidation by percussion or palpation, normal fremitus, symmetrical and full respiratory excursions Cardiovascular: normal position and quality of the apical impulse, regular rhythm, very crisp prosthetic valve sounds, barely audible systolic murmur , no diastolic murmurs, rubs or gallops Abdomen: no tenderness or distention, no masses by palpation, no abnormal pulsatility or arterial bruits, normal bowel sounds, no hepatosplenomegaly Extremities: no clubbing, cyanosis or edema; 2+ radial, ulnar and brachial pulses bilaterally; 2+ right femoral, posterior tibial and dorsalis pedis pulses; 2+ left femoral, posterior tibial  and dorsalis pedis pulses; no subclavian or femoral bruits Neurological: grossly nonfocal Psych: euthymic mood, full affect  Wt Readings from Last 3 Encounters:  09/10/16 192 lb (87.1 kg)  07/31/15 191 lb (86.6 kg)  07/08/14 187 lb 4.8 oz (85 kg)      Studies/Labs Reviewed:   EKG:  EKG is ordered today.  The ekg ordered today demonstrates Sinus rhythm With frequent PACs, subtle ST segment depression in lead 1, aVL, QTC 416 ms  LABS: March 15, 2016: total choleste HDL 49, LDL 81, triglycerides 96 TSH 1.42, creatinine 0.69, Hgb 13.5, potassium 4.5, normal LFTs , hemoglobin  ASSESSMENT:    1. H/O mechanical aortic valve replacement   2. Essential hypertension   3. Pure hypercholesterolemia   4. Long term (current) use of anticoagulants   5. Coronary artery disease involving native coronary artery of native heart without angina pectoris      PLAN:  In order of problems listed above:  1. S/p AVR: She does describe some exertional intolerance, but she is actually fairly active for 76 year old. Echo last year showed a minor increase in gradients across the prosthetic valve. Exam today does not suggest aortic stenosis. Consider reevaluating next year. 2. HTN: fair control 3. HLP: Satisfactory lipid profile on statin 4. Warfarin: Excellent prothrombin time levels, no bleeding complications or history of embolic events. 5. CAD: Previous catheterization reported total occlusion of the mid second oblique marginal artery in 2006, but repeat angiography in 2011 did not confirm this abnormality. She has never had angina pectoris.    Medication Adjustments/Labs and Tests Ordered: Current medicines are reviewed at length with the patient today.  Concerns regarding medicines are outlined above.  Medication changes, Labs and Tests ordered today are listed in the Patient Instructions below. Patient Instructions  Dr Royann Shivers recommends that you schedule a follow-up appointment in 12  months. You will receive a reminder letter in the mail two months in advance. If you don't receive a letter, please call our office to schedule the follow-up appointment.  If you need a refill on your cardiac medications before your next appointment, please call your pharmacy.    Signed, Thurmon Fair, MD  09/10/2016 10:48 AM    [facility] [address] [place], [place], [place]  [address] Phone: [phone]; Fax: [phone]

## 2016-09-16 DIAGNOSIS — E559 Vitamin D deficiency, unspecified: Secondary | ICD-10-CM | POA: Diagnosis not present

## 2016-09-16 DIAGNOSIS — M899 Disorder of bone, unspecified: Secondary | ICD-10-CM | POA: Diagnosis not present

## 2016-09-16 DIAGNOSIS — K219 Gastro-esophageal reflux disease without esophagitis: Secondary | ICD-10-CM | POA: Diagnosis not present

## 2016-09-16 DIAGNOSIS — I359 Nonrheumatic aortic valve disorder, unspecified: Secondary | ICD-10-CM | POA: Diagnosis not present

## 2016-09-16 DIAGNOSIS — I2510 Atherosclerotic heart disease of native coronary artery without angina pectoris: Secondary | ICD-10-CM | POA: Diagnosis not present

## 2016-09-16 DIAGNOSIS — I10 Essential (primary) hypertension: Secondary | ICD-10-CM | POA: Diagnosis not present

## 2016-09-16 DIAGNOSIS — E7800 Pure hypercholesterolemia, unspecified: Secondary | ICD-10-CM | POA: Diagnosis not present

## 2016-09-17 DIAGNOSIS — I10 Essential (primary) hypertension: Secondary | ICD-10-CM | POA: Diagnosis not present

## 2016-09-17 DIAGNOSIS — I2510 Atherosclerotic heart disease of native coronary artery without angina pectoris: Secondary | ICD-10-CM | POA: Diagnosis not present

## 2016-10-12 ENCOUNTER — Ambulatory Visit (INDEPENDENT_AMBULATORY_CARE_PROVIDER_SITE_OTHER): Payer: PPO | Admitting: Pharmacist Clinician (PhC)/ Clinical Pharmacy Specialist

## 2016-10-12 ENCOUNTER — Other Ambulatory Visit: Payer: Self-pay | Admitting: Cardiovascular Disease

## 2016-10-12 DIAGNOSIS — Z952 Presence of prosthetic heart valve: Secondary | ICD-10-CM

## 2016-10-12 DIAGNOSIS — Z7901 Long term (current) use of anticoagulants: Secondary | ICD-10-CM

## 2016-10-12 LAB — POCT INR: INR: 3.7

## 2016-10-13 ENCOUNTER — Ambulatory Visit
Admission: RE | Admit: 2016-10-13 | Discharge: 2016-10-13 | Disposition: A | Discharge status: Home / Self Care | Payer: PPO | Source: Ambulatory Visit | Attending: Family Medicine | Admitting: Family Medicine

## 2016-10-13 ENCOUNTER — Other Ambulatory Visit: Payer: Self-pay | Admitting: Family Medicine

## 2016-10-13 DIAGNOSIS — M899 Disorder of bone, unspecified: Secondary | ICD-10-CM | POA: Diagnosis not present

## 2016-10-13 DIAGNOSIS — R0781 Pleurodynia: Secondary | ICD-10-CM | POA: Diagnosis not present

## 2016-10-13 DIAGNOSIS — R079 Chest pain, unspecified: Secondary | ICD-10-CM

## 2016-10-13 DIAGNOSIS — R0789 Other chest pain: Secondary | ICD-10-CM | POA: Diagnosis not present

## 2016-10-20 DIAGNOSIS — M8588 Other specified disorders of bone density and structure, other site: Secondary | ICD-10-CM | POA: Diagnosis not present

## 2016-11-01 ENCOUNTER — Telehealth: Payer: Self-pay | Admitting: Pharmacist Clinician (PhC)/ Clinical Pharmacy Specialist

## 2016-11-01 DIAGNOSIS — R58 Hemorrhage, not elsewhere classified: Secondary | ICD-10-CM | POA: Diagnosis not present

## 2016-11-01 NOTE — Telephone Encounter (Signed)
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  Hand is now swollen to the point where she cannot make a fist, although she can wiggle her fingers.  Also quite bruised throughout hand.     Advised patient to go to PCP or UC for exam - they can draw INR and call results into our office.  Patient voiced understanding.

## 2016-11-02 ENCOUNTER — Ambulatory Visit (INDEPENDENT_AMBULATORY_CARE_PROVIDER_SITE_OTHER): Payer: PPO | Admitting: Pharmacist Clinician (PhC)/ Clinical Pharmacy Specialist

## 2016-11-02 DIAGNOSIS — Z7901 Long term (current) use of anticoagulants: Secondary | ICD-10-CM

## 2016-11-02 DIAGNOSIS — Z952 Presence of prosthetic heart valve: Secondary | ICD-10-CM

## 2016-11-02 LAB — POCT INR: INR: 4.2

## 2016-11-15 DIAGNOSIS — E7800 Pure hypercholesterolemia, unspecified: Secondary | ICD-10-CM | POA: Diagnosis not present

## 2016-11-17 ENCOUNTER — Ambulatory Visit (INDEPENDENT_AMBULATORY_CARE_PROVIDER_SITE_OTHER): Payer: PPO | Admitting: Pharmacist Clinician (PhC)/ Clinical Pharmacy Specialist

## 2016-11-17 DIAGNOSIS — Z952 Presence of prosthetic heart valve: Secondary | ICD-10-CM | POA: Diagnosis not present

## 2016-11-17 DIAGNOSIS — Z7901 Long term (current) use of anticoagulants: Secondary | ICD-10-CM | POA: Diagnosis not present

## 2016-11-17 LAB — POCT INR: INR: 3.6

## 2016-12-08 ENCOUNTER — Ambulatory Visit (INDEPENDENT_AMBULATORY_CARE_PROVIDER_SITE_OTHER): Payer: PPO | Admitting: Pharmacist

## 2016-12-08 DIAGNOSIS — Z952 Presence of prosthetic heart valve: Secondary | ICD-10-CM | POA: Diagnosis not present

## 2016-12-08 DIAGNOSIS — Z7901 Long term (current) use of anticoagulants: Secondary | ICD-10-CM

## 2016-12-08 LAB — POCT INR: INR: 2.5

## 2017-01-06 ENCOUNTER — Ambulatory Visit (INDEPENDENT_AMBULATORY_CARE_PROVIDER_SITE_OTHER): Payer: PPO | Admitting: Pharmacist Clinician (PhC)/ Clinical Pharmacy Specialist

## 2017-01-06 DIAGNOSIS — Z952 Presence of prosthetic heart valve: Secondary | ICD-10-CM | POA: Diagnosis not present

## 2017-01-06 DIAGNOSIS — Z7901 Long term (current) use of anticoagulants: Secondary | ICD-10-CM

## 2017-01-06 LAB — POCT INR: INR: 3

## 2017-01-07 DIAGNOSIS — Z01411 Encounter for gynecological examination (general) (routine) with abnormal findings: Secondary | ICD-10-CM | POA: Diagnosis not present

## 2017-01-07 DIAGNOSIS — N816 Rectocele: Secondary | ICD-10-CM | POA: Diagnosis not present

## 2017-02-09 ENCOUNTER — Ambulatory Visit (INDEPENDENT_AMBULATORY_CARE_PROVIDER_SITE_OTHER): Payer: PPO | Admitting: Pharmacist Clinician (PhC)/ Clinical Pharmacy Specialist

## 2017-02-09 DIAGNOSIS — Z952 Presence of prosthetic heart valve: Secondary | ICD-10-CM

## 2017-02-09 DIAGNOSIS — Z7901 Long term (current) use of anticoagulants: Secondary | ICD-10-CM

## 2017-02-09 LAB — POCT INR: INR: 2.1

## 2017-02-09 NOTE — Patient Instructions (Signed)
Description   Take 2 tablets (10mg ) today, January 16th. Take 1 tablet daily except 1.5 tablets each Monday, Wednesday, Friday, and Saturday.  Repeat INR in 3 weeks

## 2017-02-15 DIAGNOSIS — J069 Acute upper respiratory infection, unspecified: Secondary | ICD-10-CM | POA: Diagnosis not present

## 2017-02-15 DIAGNOSIS — R05 Cough: Secondary | ICD-10-CM | POA: Diagnosis not present

## 2017-02-22 DIAGNOSIS — L821 Other seborrheic keratosis: Secondary | ICD-10-CM | POA: Diagnosis not present

## 2017-02-22 DIAGNOSIS — D229 Melanocytic nevi, unspecified: Secondary | ICD-10-CM | POA: Diagnosis not present

## 2017-02-22 DIAGNOSIS — L814 Other melanin hyperpigmentation: Secondary | ICD-10-CM | POA: Diagnosis not present

## 2017-02-22 DIAGNOSIS — D1801 Hemangioma of skin and subcutaneous tissue: Secondary | ICD-10-CM | POA: Diagnosis not present

## 2017-03-04 ENCOUNTER — Ambulatory Visit (INDEPENDENT_AMBULATORY_CARE_PROVIDER_SITE_OTHER): Payer: PPO | Admitting: Pharmacist Clinician (PhC)/ Clinical Pharmacy Specialist

## 2017-03-04 DIAGNOSIS — Z952 Presence of prosthetic heart valve: Secondary | ICD-10-CM

## 2017-03-04 DIAGNOSIS — Z7901 Long term (current) use of anticoagulants: Secondary | ICD-10-CM | POA: Diagnosis not present

## 2017-03-04 LAB — POCT INR: INR: 3.7

## 2017-03-22 DIAGNOSIS — E7800 Pure hypercholesterolemia, unspecified: Secondary | ICD-10-CM | POA: Diagnosis not present

## 2017-03-22 DIAGNOSIS — K219 Gastro-esophageal reflux disease without esophagitis: Secondary | ICD-10-CM | POA: Diagnosis not present

## 2017-03-22 DIAGNOSIS — Z0000 Encounter for general adult medical examination without abnormal findings: Secondary | ICD-10-CM | POA: Diagnosis not present

## 2017-03-22 DIAGNOSIS — M899 Disorder of bone, unspecified: Secondary | ICD-10-CM | POA: Diagnosis not present

## 2017-03-22 DIAGNOSIS — I10 Essential (primary) hypertension: Secondary | ICD-10-CM | POA: Diagnosis not present

## 2017-03-22 DIAGNOSIS — I359 Nonrheumatic aortic valve disorder, unspecified: Secondary | ICD-10-CM | POA: Diagnosis not present

## 2017-03-22 DIAGNOSIS — I2510 Atherosclerotic heart disease of native coronary artery without angina pectoris: Secondary | ICD-10-CM | POA: Diagnosis not present

## 2017-03-31 ENCOUNTER — Ambulatory Visit (INDEPENDENT_AMBULATORY_CARE_PROVIDER_SITE_OTHER): Payer: PPO | Admitting: Pharmacist

## 2017-03-31 DIAGNOSIS — Z7901 Long term (current) use of anticoagulants: Secondary | ICD-10-CM | POA: Diagnosis not present

## 2017-03-31 DIAGNOSIS — Z952 Presence of prosthetic heart valve: Secondary | ICD-10-CM

## 2017-03-31 LAB — POCT INR: INR: 3.7

## 2017-04-06 ENCOUNTER — Other Ambulatory Visit: Payer: Self-pay | Admitting: Cardiovascular Disease

## 2017-04-14 DIAGNOSIS — B349 Viral infection, unspecified: Secondary | ICD-10-CM | POA: Diagnosis not present

## 2017-04-15 ENCOUNTER — Ambulatory Visit (INDEPENDENT_AMBULATORY_CARE_PROVIDER_SITE_OTHER): Payer: PPO | Admitting: Pharmacist Clinician (PhC)/ Clinical Pharmacy Specialist

## 2017-04-15 DIAGNOSIS — Z952 Presence of prosthetic heart valve: Secondary | ICD-10-CM

## 2017-04-15 DIAGNOSIS — Z7901 Long term (current) use of anticoagulants: Secondary | ICD-10-CM

## 2017-04-15 LAB — POCT INR: INR: 3.2

## 2017-05-19 ENCOUNTER — Ambulatory Visit (INDEPENDENT_AMBULATORY_CARE_PROVIDER_SITE_OTHER): Payer: PPO | Admitting: Pharmacist

## 2017-05-19 DIAGNOSIS — Z7901 Long term (current) use of anticoagulants: Secondary | ICD-10-CM | POA: Diagnosis not present

## 2017-05-19 DIAGNOSIS — Z952 Presence of prosthetic heart valve: Secondary | ICD-10-CM

## 2017-05-19 LAB — POCT INR: INR: 3.2

## 2017-06-07 DIAGNOSIS — I10 Essential (primary) hypertension: Secondary | ICD-10-CM | POA: Diagnosis not present

## 2017-06-07 DIAGNOSIS — I2510 Atherosclerotic heart disease of native coronary artery without angina pectoris: Secondary | ICD-10-CM | POA: Diagnosis not present

## 2017-06-30 ENCOUNTER — Ambulatory Visit (INDEPENDENT_AMBULATORY_CARE_PROVIDER_SITE_OTHER): Payer: PPO | Admitting: Pharmacist Clinician (PhC)/ Clinical Pharmacy Specialist

## 2017-06-30 DIAGNOSIS — Z952 Presence of prosthetic heart valve: Secondary | ICD-10-CM

## 2017-06-30 DIAGNOSIS — Z7901 Long term (current) use of anticoagulants: Secondary | ICD-10-CM

## 2017-06-30 LAB — POCT INR: INR: 2.9 (ref 2.0–3.0)

## 2017-07-18 ENCOUNTER — Encounter: Payer: Self-pay | Admitting: Physician Assistant

## 2017-07-18 ENCOUNTER — Ambulatory Visit: Payer: PPO | Admitting: Physician Assistant

## 2017-07-18 VITALS — BP 158/84 | HR 74 | Ht 67.0 in | Wt 191.0 lb

## 2017-07-18 DIAGNOSIS — I10 Essential (primary) hypertension: Secondary | ICD-10-CM

## 2017-07-18 DIAGNOSIS — I2510 Atherosclerotic heart disease of native coronary artery without angina pectoris: Secondary | ICD-10-CM

## 2017-07-18 DIAGNOSIS — R600 Localized edema: Secondary | ICD-10-CM

## 2017-07-18 DIAGNOSIS — Z952 Presence of prosthetic heart valve: Secondary | ICD-10-CM | POA: Diagnosis not present

## 2017-07-18 MED ORDER — AMLODIPINE BESYLATE 10 MG PO TABS: 10.0000 mg | ORAL_TABLET | Freq: Every day | ORAL | 1 refills | Status: DC

## 2017-07-18 NOTE — Patient Instructions (Addendum)
Medication Instructions:   START TAKING AMLODIPINE 10 MG ONCE A DAY  FINISHING TAKING AMLODIPINE 5 MG TWICE DAY TILL GONE THEN START ON NEW  RX 10 MG   If you need a refill on your cardiac medications before your next appointment, please call your pharmacy.  Labwork: NONE ORDERED  TODAY    Testing/Procedures: Your physician has requested that you have an echocardiogram. Echocardiography is a painless test that uses sound waves to create images of your heart. It provides your doctor with information about the size and shape of your heart and how well your heart's chambers and valves are working. This procedure takes approximately one hour. There are no restrictions for this procedure.    Follow-Up: WITH DR Sallyanne Kuster  IN [place]    Any Other Special Instructions Will Be Listed Below (If Applicable).

## 2017-07-18 NOTE — Progress Notes (Signed)
Cardiology Office Note   Date:  07/18/2017   ID:  MANICA CASSIANO, DOB 05-18-1940, MRN [id number]  PCP:  Blair Heys, MD  Cardiologist:  Dr Royann Shivers, 09/10/2016  Theodore Demark, PA-C    History of Present Illness: Sharon Rose is a 77 y.o. female with a history of mech AVR, HTN, nl cors 2011, MI 2006 w/ OM2 occlusion>>string sign at cath, nl EF   [name] presents for cardiology evaluation.   She has been having problems w/ LE edema for about 6 weeks. This is associated w/ paresthesias.   Some days are better than others. She has cut out processed meats such as bacon and deli meat. She is using less salt on her food, including tomato sandwiches. The swelling goes down overnight, but is unusual for her.   However, since making the dietary changes, her edema has improved.   Her BP has been running higher than usual up to 150s/90s. This is unusual for her.   No chest pain.   Has DOE, but no recent change. Has bendopnea.   She denies weight change, stress of any type. No change in caffeine. Drinks 2 cups coffee daily, not much caffeine otherwise.   She can sometimes notice the heart skips, but not often. This has been going on for years.    Past Medical History:  Diagnosis Date  . CAD (coronary artery disease)    NUCLEAR STRESS TEST, 10/07/2008 - no ischemia  . Dyspnea on exertion   . H/O aortic valve replacement    2D ECHO, 04/26/2011 - EF 50-55%, normal-mild  . Hypertension   . NSTEMI (non-ST elevated myocardial infarction) (HCC) 12/2004   Secondary to occlusion of OM 2, small vessel and medical therapy recommended; cath 2011 with no CAD    Past Surgical History:  Procedure Laterality Date  . AORTIC VALVE REPLACEMENT  09/12/2004   St. Jude mechanical aortic valve, model #23AK-501, serial [id number]  . CARDIAC CATHETERIZATION Left 03/25/2009   Normal cors  . CARDIAC CATHETERIZATION  01/20/2005   Medical management for occluded and possibly revascularized OM  2, string sign seen  . CARDIAC CATHETERIZATION  08/11/2004   Elective aortic valve replacement, severe aortic valvre stenosis with functional class II to III symptomatology    Current Outpatient Medications  Medication Sig Dispense Refill  . acetaminophen (TYLENOL) 500 MG tablet Take 1,000 mg by mouth every 6 (six) hours as needed.    Marland Kitchen amLODipine (NORVASC) 5 MG tablet Take 5 mg by mouth daily.    Marland Kitchen aspirin EC 81 MG tablet Take 81 mg by mouth daily.    . calcium carbonate (OS-CAL) 600 MG TABS Take 600 mg by mouth 2 (two) times daily with a meal.    . losartan (COZAAR) 100 MG tablet Take 1 tablet (100 mg total) by mouth daily. 30 tablet 5  . Multiple Vitamin (MULTIVITAMIN) tablet Take 1 tablet by mouth daily.    Marland Kitchen omeprazole (PRILOSEC) 20 MG capsule Take 20 mg by mouth daily.    . rosuvastatin (CRESTOR) 5 MG tablet Take 5 mg by mouth daily.    Marland Kitchen warfarin (COUMADIN) 5 MG tablet TAKE 1 TO 1 & 1/2 TABLETS BY MOUTH EVERY DAY AS DIRECTED BY COUMADIN CLINIC 130 tablet 1   No current facility-administered medications for this visit.     Allergies:   Codeine; Statins; Sulfa antibiotics; and Verapamil    Social History:  The patient  reports that she has never smoked. She has  never used smokeless tobacco. She reports that she does not drink alcohol or use drugs.   Family History:  The patient's family history is not on file.    ROS:  Please see the history of present illness. All other systems are reviewed and negative.    PHYSICAL EXAM: VS:  BP (!) 158/84   Pulse 74   Ht 5\' 7"  (1.702 m)   Wt 191 lb (86.6 kg)   BMI 29.91 kg/m  , BMI Body mass index is 29.91 kg/m. GEN: Well nourished, well developed, female in no acute distress  HEENT: normal for age  Neck: no JVD, no carotid bruit, no masses Cardiac: RRR; crisp valve click with soft systolic murmur, no rubs, or gallops Respiratory:  clear to auscultation bilaterally, normal work of breathing GI: soft, nontender, nondistended, +  BS MS: no deformity or atrophy; no edema; distal pulses are 2+ in all 4 extremities   Skin: warm and dry, no rash Neuro:  Strength and sensation are intact Psych: euthymic mood, full affect   EKG:  EKG is not ordered today.   ECHO: 08/18/2015 - Left ventricle: The cavity size was normal. Wall thickness was   increased in a pattern of mild LVH. Systolic function was normal.   The estimated ejection fraction was in the range of 55% to 60%.   Doppler parameters are consistent with abnormal left ventricular   relaxation (grade 1 diastolic dysfunction). - Aortic valve: AV prosthesis is difficult to see Peak and mean   gradients through the valve are 33 and 18 mm Hg respectively.   There was trivial regurgitation.  CARDIAC CATH: 01/22/2005 RESULTS:  The left main coronary artery was virtually nonexistent. The  circumflex and the LAD arose by separate ostia.   The LAD was a medium-sized vessel giving rise to one large septal perforator  branch and then the LAD gave rise to one medium-sized diagonal branch about  25 mm down from the first septal perforator branch. This diagonal and LAD  were both normal as well as a septal perforator branch.   The left circumflex coronary artery was a dominant vessel in this  circulation, giving rise to a small first obtuse marginal branch, a second  small but longer obtuse marginal branch, a huge third obtuse marginal branch  and then the circumflex itself coursed through atrioventricular groove and  gave rise to a posterolateral branch. There was noted to be a string sign in  the second obtuse marginal branch over about 40 mm in length and there was  retrograde collateral flow into this vessel by retrograde collaterals. This  was felt not to be amenable to stenting and would not be a suitable  candidate for bypass grafting either.   Right coronary artery was small and nondominant and gave rise to very small  distal branches. It was a  nondominant vessel in circulation.   PLAN:  The patient will be treated medically. She will have her heparin  restarted to protect her valves. She will be restarted on Coumadin. She will  be discharged at the appropriate time on medical therapy for her coronary  disease.   FINAL DIAGNOSIS:  1.  Recent non-Q-wave myocardial infarction by enzyme and clinical enzymes      and clinical information.  2.  Interval occlusion of the second obtuse marginal branch of left      circumflex with retrograde collateral flow (when compared to last      catheterization dated August 31, 2004).  Recent Labs: No results found for requested labs within last 8760 hours.    Lipid Panel    Component Value Date/Time   CHOL 190 07/08/2014 1053   TRIG 188 (H) 07/08/2014 1053   HDL 46 07/08/2014 1053   CHOLHDL 4.1 07/08/2014 1053   VLDL 38 07/08/2014 1053   LDLCALC 106 (H) 07/08/2014 1053     Wt Readings from Last 3 Encounters:  07/18/17 191 lb (86.6 kg)  09/10/16 192 lb (87.1 kg)  07/31/15 191 lb (86.6 kg)     Other studies Reviewed: Additional studies/ records that were reviewed today include: Office notes, hospital records and testing.  ASSESSMENT AND PLAN:  1.  Mechanical AVR: Her surgery was in 2006. - Mean gradient was 16 2 years ago. -Recheck echocardiogram. -If gradients have worsened, this may account for her dyspnea on exertion and lower extremity edema.  2.  Hypertension: Her blood pressure is elevated today. -She had diastolic dysfunction in the past. -She would benefit from improved blood pressure control, increase amlodipine from 5 mg up to 10 mg daily. -She is to let us know if this increases her lower extremity edema. -Continue Cozaar 100 mg daily  3.  CAD: She had a non-STEMI in 12/2004 secondary to occlusion of her OM 2.  Subsequent cath in 2011 showed no coronary artery disease per Dr. Donavan Burnet note. - Continue aspirin and statin. -She is not currently on a beta-blocker,  Dr. Royann Shivers to review and advise  4.  Lower extremity edema: - Symptoms have improved greatly since she has made dietary changes. --Once echo results reviewed, decide on diuretic therapy   Current medicines are reviewed at length with the patient today.  The patient does not have concerns regarding medicines.  The following changes have been made: Increase amlodipine  Labs/ tests ordered today include:   Orders Placed This Encounter  Procedures  . ECHOCARDIOGRAM COMPLETE     Disposition:   FU with Dr Royann Shivers  Signed, Theodore Demark, PA-C  07/18/2017 5:23 PM    [facility] Phone: [phone]; Fax: [phone]  This note was written with the assistance of speech recognition software. Please excuse any transcriptional errors.

## 2017-07-21 NOTE — Progress Notes (Signed)
The history of CAD in 2006 was confusing. If anything, she may have had a transient OM occlusion due to a cardioembolic event (after AVR, postop AFib). No true coronary stenoses as far as I can tell. So no real indication for beta blockers I see echo scheduled for July 5. Will follow up [facility]

## 2017-07-29 ENCOUNTER — Ambulatory Visit (HOSPITAL_COMMUNITY): Payer: PPO | Attending: Cardiovascular Disease

## 2017-07-29 ENCOUNTER — Other Ambulatory Visit: Payer: Self-pay

## 2017-07-29 DIAGNOSIS — I2510 Atherosclerotic heart disease of native coronary artery without angina pectoris: Secondary | ICD-10-CM | POA: Diagnosis not present

## 2017-07-29 DIAGNOSIS — I10 Essential (primary) hypertension: Secondary | ICD-10-CM | POA: Insufficient documentation

## 2017-07-29 DIAGNOSIS — Z952 Presence of prosthetic heart valve: Secondary | ICD-10-CM

## 2017-07-29 DIAGNOSIS — I252 Old myocardial infarction: Secondary | ICD-10-CM | POA: Diagnosis not present

## 2017-07-29 DIAGNOSIS — R600 Localized edema: Secondary | ICD-10-CM | POA: Diagnosis not present

## 2017-08-01 ENCOUNTER — Telehealth: Payer: Self-pay

## 2017-08-01 NOTE — Telephone Encounter (Signed)
Patient states she is having some swelling real bad and having some tingling in her feet with the swelling. Wants to know what she should do?

## 2017-08-01 NOTE — Telephone Encounter (Signed)
-----   Message from Lonn Georgia, PA-C sent at 07/29/2017  4:11 PM EDT ----- Please let her know that her heart muscle function is normal.  Her heart muscle is little thickened, need to make sure her blood pressure is under good control. Her gradients are stable and there is no leak around the valve. Thanks

## 2017-08-02 NOTE — Telephone Encounter (Signed)
Please let her know that this may be the amlodipine at the higher dose. Please ask her to hold the amlodipine for 5 days, call us if the swelling does not improve. If the swelling improves off the amlodipine try a half tablet. If she gets lower extremity edema on 1/2 tablet, stop it completely. Let us know how she is doing.

## 2017-08-02 NOTE — Telephone Encounter (Signed)
Patient notified directly and voiced understanding.  

## 2017-08-09 DIAGNOSIS — R609 Edema, unspecified: Secondary | ICD-10-CM | POA: Diagnosis not present

## 2017-08-09 DIAGNOSIS — I10 Essential (primary) hypertension: Secondary | ICD-10-CM | POA: Diagnosis not present

## 2017-08-10 ENCOUNTER — Ambulatory Visit: Payer: PPO | Admitting: Pharmacist

## 2017-08-10 DIAGNOSIS — Z7901 Long term (current) use of anticoagulants: Secondary | ICD-10-CM

## 2017-08-10 DIAGNOSIS — Z952 Presence of prosthetic heart valve: Secondary | ICD-10-CM

## 2017-08-10 LAB — POCT INR: INR: 3.5 — AB (ref 2.0–3.0)

## 2017-08-10 NOTE — Patient Instructions (Signed)
Description   Continue with 1 tablet daily except 1.5 tablets each Monday, Wednesday, Friday, and Saturday.  Repeat INR in 6 weeks.

## 2017-09-06 DIAGNOSIS — Z1231 Encounter for screening mammogram for malignant neoplasm of breast: Secondary | ICD-10-CM | POA: Diagnosis not present

## 2017-09-20 DIAGNOSIS — K219 Gastro-esophageal reflux disease without esophagitis: Secondary | ICD-10-CM | POA: Diagnosis not present

## 2017-09-20 DIAGNOSIS — I10 Essential (primary) hypertension: Secondary | ICD-10-CM | POA: Diagnosis not present

## 2017-09-20 DIAGNOSIS — I359 Nonrheumatic aortic valve disorder, unspecified: Secondary | ICD-10-CM | POA: Diagnosis not present

## 2017-09-20 DIAGNOSIS — M899 Disorder of bone, unspecified: Secondary | ICD-10-CM | POA: Diagnosis not present

## 2017-09-20 DIAGNOSIS — E7800 Pure hypercholesterolemia, unspecified: Secondary | ICD-10-CM | POA: Diagnosis not present

## 2017-09-20 DIAGNOSIS — I2510 Atherosclerotic heart disease of native coronary artery without angina pectoris: Secondary | ICD-10-CM | POA: Diagnosis not present

## 2017-09-20 DIAGNOSIS — R7309 Other abnormal glucose: Secondary | ICD-10-CM | POA: Diagnosis not present

## 2017-09-27 ENCOUNTER — Encounter: Payer: Self-pay | Admitting: Cardiovascular Disease

## 2017-09-27 ENCOUNTER — Ambulatory Visit (INDEPENDENT_AMBULATORY_CARE_PROVIDER_SITE_OTHER): Payer: PPO | Admitting: Pharmacist Clinician (PhC)/ Clinical Pharmacy Specialist

## 2017-09-27 ENCOUNTER — Ambulatory Visit: Payer: PPO | Admitting: Cardiovascular Disease

## 2017-09-27 VITALS — BP 116/76 | HR 66 | Ht 66.0 in | Wt 190.4 lb

## 2017-09-27 DIAGNOSIS — Z7901 Long term (current) use of anticoagulants: Secondary | ICD-10-CM | POA: Diagnosis not present

## 2017-09-27 DIAGNOSIS — I10 Essential (primary) hypertension: Secondary | ICD-10-CM | POA: Diagnosis not present

## 2017-09-27 DIAGNOSIS — Z952 Presence of prosthetic heart valve: Secondary | ICD-10-CM

## 2017-09-27 DIAGNOSIS — E7800 Pure hypercholesterolemia, unspecified: Secondary | ICD-10-CM

## 2017-09-27 LAB — POCT INR: INR: 2.7 (ref 2.0–3.0)

## 2017-09-27 NOTE — Patient Instructions (Signed)
Dr Croitoru recommends that you schedule a follow-up appointment in 12 months. You will receive a reminder letter in the mail two months in advance. If you don't receive a letter, please call our office to schedule the follow-up appointment.  If you need a refill on your cardiac medications before your next appointment, please call your pharmacy. 

## 2017-09-27 NOTE — Progress Notes (Signed)
Cardiology Office Note    Date:  09/27/2017   ID:  Sharon Rose, DOB [birth year], MRN [id number]  PCP:  Blair Heys, MD  Cardiologist:   Thurmon Fair, MD   Chief Complaint  Patient presents with  . Follow-up    AVR, edema    History of Present Illness:  Sharon Rose is a 77 y.o. female who presents for f/u of mechanical AVR and hyperlipidemia and HTN.  She has had recent problems with reduced exercise tolerance due to dyspnea.  She had a repeat echocardiogram on July 29, 2017-normal left ventricular ejection fraction 55-60% and normal diastolic parameters, normal valve function with mean gradient 17 mmHg (unchanged from 2014 and 2017).  Ankle edema resolved after discontinuation amlodipine.  Her blood pressure remains well controlled.  Exercise tolerance is not great, but generally she feels well. The patient specifically denies any chest pain at rest or with exertion, orthopnea, paroxysmal nocturnal dyspnea, syncope, palpitations, focal neurological deficits, intermittent claudication, lower extremity edema, unexplained weight gain, cough, hemoptysis or wheezing.  Weight is unchanged.  Anticoagulation levels remain optimal.  No serious bleeding problems.  Sharon Rose  had replacement of a severely stenotic, probably congenitally bicuspid aortic valve in 2006 with a 23 mm mechanical valve. She has HTN and dyslipidemia that is treated with a statin. Coronary angiography before surgery showed total occlusion of the mid second oblique marginal artery by preoperative coronary angiography in 2006, but no angina and no evidence of ischemia by nuclear stress testing. Repeat coronary angiography in March 2011 did not confirm occlusion of the second oblique marginal branch. She has normal LV function and no history of embolic events. In 2014 she presented with thiazide induced hypokalemia and bradycardia. Her heart rate normalized after electrolyte correction. We tried to stop her diuretic  completely, but this was associated with ankle swelling and difficult to control blood pressure. She has a history of postoperative atrial fibrillation without detected recurrence since then. Normal 30 day event monitor in July 2017.  Past Medical History:  Diagnosis Date  . CAD (coronary artery disease)    NUCLEAR STRESS TEST, 10/07/2008 - no ischemia  . Dyspnea on exertion   . H/O aortic valve replacement    2D ECHO, 04/26/2011 - EF 50-55%, normal-mild  . Hypertension   . NSTEMI (non-ST elevated myocardial infarction) (HCC) 12/2004   Secondary to occlusion of OM 2, small vessel and medical therapy recommended; cath 2011 with no CAD    Past Surgical History:  Procedure Laterality Date  . AORTIC VALVE REPLACEMENT  09/12/2004   St. Jude mechanical aortic valve, model #23AK-501, serial [id number]  . CARDIAC CATHETERIZATION Left 03/25/2009   Normal cors  . CARDIAC CATHETERIZATION  01/20/2005   Medical management for occluded and possibly revascularized OM 2, string sign seen  . CARDIAC CATHETERIZATION  08/11/2004   Elective aortic valve replacement, severe aortic valvre stenosis with functional class II to III symptomatology    Current Medications: Outpatient Medications Prior to Visit  Medication Sig Dispense Refill  . acetaminophen (TYLENOL) 500 MG tablet Take 1,000 mg by mouth every 6 (six) hours as needed.    Marland Kitchen aspirin EC 81 MG tablet Take 81 mg by mouth daily.    Marland Kitchen atorvastatin (LIPITOR) 10 MG tablet Take 10 mg by mouth daily.    . calcium carbonate (OS-CAL) 600 MG TABS Take 600 mg by mouth 2 (two) times daily with a meal.    . losartan (COZAAR) 100 MG tablet Take 1  tablet (100 mg total) by mouth daily. 30 tablet 5  . Multiple Vitamin (MULTIVITAMIN) tablet Take 1 tablet by mouth daily.    Marland Kitchen omeprazole (PRILOSEC) 20 MG capsule Take 20 mg by mouth daily.    Marland Kitchen warfarin (COUMADIN) 5 MG tablet TAKE 1 TO 1 & 1/2 TABLETS BY MOUTH EVERY DAY AS DIRECTED BY COUMADIN CLINIC 130 tablet 1  .  amLODipine (NORVASC) 10 MG tablet Take 1 tablet (10 mg total) by mouth daily. (Patient not taking: Reported on 09/27/2017) 90 tablet 1  . rosuvastatin (CRESTOR) 5 MG tablet Take 5 mg by mouth daily.     No facility-administered medications prior to visit.      Allergies:   Codeine; Statins; Sulfa antibiotics; and Verapamil   Social History   Socioeconomic History  . Marital status: Married    Spouse name: Not on file  . Number of children: Not on file  . Years of education: Not on file  . Highest education level: Not on file  Occupational History  . Not on file  Social Needs  . Financial resource strain: Not on file  . Food insecurity:    Worry: Not on file    Inability: Not on file  . Transportation needs:    Medical: Not on file    Non-medical: Not on file  Tobacco Use  . Smoking status: Never Smoker  . Smokeless tobacco: Never Used  Substance and Sexual Activity  . Alcohol use: No  . Drug use: No  . Sexual activity: Not on file  Lifestyle  . Physical activity:    Days per week: Not on file    Minutes per session: Not on file  . Stress: Not on file  Relationships  . Social connections:    Talks on phone: Not on file    Gets together: Not on file    Attends religious service: Not on file    Active member of club or organization: Not on file    Attends meetings of clubs or organizations: Not on file    Relationship status: Not on file  Other Topics Concern  . Not on file  Social History Narrative  . Not on file      ROS:   Please see the history of present illness.    ROS All other systems reviewed and are negative.   PHYSICAL EXAM:   VS:  BP 116/76   Pulse 66   Ht 5\' 6"  (1.676 m)   Wt 190 lb 6.4 oz (86.4 kg)   BMI 30.73 kg/m      General: Alert, oriented x3, no distress, mildly obese Head: no evidence of trauma, PERRL, EOMI, no exophtalmos or lid lag, no myxedema, no xanthelasma; normal ears, nose and oropharynx Neck: normal jugular venous  pulsations and no hepatojugular reflux; brisk carotid pulses without delay and no carotid bruits Chest: clear to auscultation, no signs of consolidation by percussion or palpation, normal fremitus, symmetrical and full respiratory excursions Cardiovascular: normal position and quality of the apical impulse, regular rhythm, very crisp prosthetic bowel sounds, normal first and second heart sounds, barely audible aortic ejection murmur, no diastolic murmurs, rubs or gallops Abdomen: no tenderness or distention, no masses by palpation, no abnormal pulsatility or arterial bruits, normal bowel sounds, no hepatosplenomegaly Extremities: no clubbing, cyanosis or edema; 2+ radial, ulnar and brachial pulses bilaterally; 2+ right femoral, posterior tibial and dorsalis pedis pulses; 2+ left femoral, posterior tibial and dorsalis pedis pulses; no subclavian or femoral bruits Neurological:  grossly nonfocal Psych: Normal mood and affect  Wt Readings from Last 3 Encounters:  09/27/17 190 lb 6.4 oz (86.4 kg)  07/18/17 191 lb (86.6 kg)  09/10/16 192 lb (87.1 kg)      Studies/Labs Reviewed:   EKG:  EKG is not ordered today.    LABS: March 15, 2016: total choleste HDL 49, LDL 81, triglycerides 96 TSH 1.42, creatinine 0.69, Hgb 13.5, potassium 4.5, normal LFTs , hemoglobin  ASSESSMENT:    1. History of mechanical aortic valve replacement   2. Essential hypertension   3. Hypercholesterolemia   4. Long term current use of anticoagulant      PLAN:  In order of problems listed above:  1. S/p AVR: There is been no significant change in her aortic valve gradients over the years.  No evidence of prosthetic valve dysfunction. 2. HTN: Good control even after stopping amlodipine 3. HLP: Acceptable lipid profile on statin, it seems she does not really have coronary disease. 4. Warfarin: INR is always therapeutic, no bleeding complications or history of embolic events. 5. CAD: Doubtful diagnosis.  Previous  catheterization reported total occlusion of the mid second oblique marginal artery in 2006, but repeat angiography in 2011 did not confirm this abnormality. She has never had angina pectoris.    Medication Adjustments/Labs and Tests Ordered: Current medicines are reviewed at length with the patient today.  Concerns regarding medicines are outlined above.  Medication changes, Labs and Tests ordered today are listed in the Patient Instructions below. Patient Instructions  Dr Royann Shivers recommends that you schedule a follow-up appointment in 12 months. You will receive a reminder letter in the mail two months in advance. If you don't receive a letter, please call our office to schedule the follow-up appointment.  If you need a refill on your cardiac medications before your next appointment, please call your pharmacy.    Signed, Thurmon Fair, MD  09/27/2017 12:14 PM    [facility] [address] [place], [place], [place]  [address] Phone: [phone]; Fax: [phone]

## 2017-09-29 ENCOUNTER — Other Ambulatory Visit: Payer: Self-pay | Admitting: Cardiovascular Disease

## 2017-11-03 DIAGNOSIS — Z961 Presence of intraocular lens: Secondary | ICD-10-CM | POA: Diagnosis not present

## 2017-11-03 DIAGNOSIS — H26493 Other secondary cataract, bilateral: Secondary | ICD-10-CM | POA: Diagnosis not present

## 2017-11-03 DIAGNOSIS — H04123 Dry eye syndrome of bilateral lacrimal glands: Secondary | ICD-10-CM | POA: Diagnosis not present

## 2017-11-07 ENCOUNTER — Ambulatory Visit: Payer: PPO | Admitting: Pharmacist Clinician (PhC)/ Clinical Pharmacy Specialist

## 2017-11-07 DIAGNOSIS — Z952 Presence of prosthetic heart valve: Secondary | ICD-10-CM

## 2017-11-07 DIAGNOSIS — Z7901 Long term (current) use of anticoagulants: Secondary | ICD-10-CM | POA: Diagnosis not present

## 2017-11-07 LAB — POCT INR: INR: 3.7 — AB (ref 2.0–3.0)

## 2017-12-19 ENCOUNTER — Ambulatory Visit: Payer: PPO | Admitting: Pharmacist Clinician (PhC)/ Clinical Pharmacy Specialist

## 2017-12-19 DIAGNOSIS — Z952 Presence of prosthetic heart valve: Secondary | ICD-10-CM | POA: Diagnosis not present

## 2017-12-19 DIAGNOSIS — Z7901 Long term (current) use of anticoagulants: Secondary | ICD-10-CM

## 2017-12-19 LAB — POCT INR: INR: 4.7 — AB (ref 2.0–3.0)

## 2017-12-19 NOTE — Patient Instructions (Signed)
Description   No warfarin today Monday Nov 25, then decrease dose to 1 tablet daily except 1.5 tablets each Monday, Wednesday and Friday.  Repeat INR in 2 weeks

## 2018-01-04 ENCOUNTER — Ambulatory Visit: Payer: PPO | Admitting: Pharmacist Clinician (PhC)/ Clinical Pharmacy Specialist

## 2018-01-04 DIAGNOSIS — Z952 Presence of prosthetic heart valve: Secondary | ICD-10-CM

## 2018-01-04 DIAGNOSIS — Z7901 Long term (current) use of anticoagulants: Secondary | ICD-10-CM | POA: Diagnosis not present

## 2018-01-04 LAB — POCT INR: INR: 4.4 — AB (ref 2.0–3.0)

## 2018-01-04 NOTE — Patient Instructions (Signed)
Description   No warfarin today Wednesday Dec 11, then decrease dose to 1 tablet daily except 1.5 tablets each Monday and Friday.  Repeat INR in 3 weeks

## 2018-01-10 DIAGNOSIS — F4321 Adjustment disorder with depressed mood: Secondary | ICD-10-CM | POA: Diagnosis not present

## 2018-01-10 DIAGNOSIS — N816 Rectocele: Secondary | ICD-10-CM | POA: Diagnosis not present

## 2018-01-10 DIAGNOSIS — Z01419 Encounter for gynecological examination (general) (routine) without abnormal findings: Secondary | ICD-10-CM | POA: Diagnosis not present

## 2018-01-30 ENCOUNTER — Ambulatory Visit: Payer: PPO | Admitting: Pharmacist Clinician (PhC)/ Clinical Pharmacy Specialist

## 2018-01-30 DIAGNOSIS — Z7901 Long term (current) use of anticoagulants: Secondary | ICD-10-CM

## 2018-01-30 DIAGNOSIS — Z952 Presence of prosthetic heart valve: Secondary | ICD-10-CM | POA: Diagnosis not present

## 2018-01-30 LAB — POCT INR: INR: 2.6 (ref 2.0–3.0)

## 2018-02-21 DIAGNOSIS — L814 Other melanin hyperpigmentation: Secondary | ICD-10-CM | POA: Diagnosis not present

## 2018-02-21 DIAGNOSIS — B078 Other viral warts: Secondary | ICD-10-CM | POA: Diagnosis not present

## 2018-02-21 DIAGNOSIS — D225 Melanocytic nevi of trunk: Secondary | ICD-10-CM | POA: Diagnosis not present

## 2018-02-21 DIAGNOSIS — D1801 Hemangioma of skin and subcutaneous tissue: Secondary | ICD-10-CM | POA: Diagnosis not present

## 2018-02-21 DIAGNOSIS — L821 Other seborrheic keratosis: Secondary | ICD-10-CM | POA: Diagnosis not present

## 2018-02-21 DIAGNOSIS — L820 Inflamed seborrheic keratosis: Secondary | ICD-10-CM | POA: Diagnosis not present

## 2018-02-27 ENCOUNTER — Ambulatory Visit (INDEPENDENT_AMBULATORY_CARE_PROVIDER_SITE_OTHER): Payer: PPO | Admitting: Pharmacist

## 2018-02-27 DIAGNOSIS — Z7901 Long term (current) use of anticoagulants: Secondary | ICD-10-CM | POA: Diagnosis not present

## 2018-02-27 DIAGNOSIS — Z952 Presence of prosthetic heart valve: Secondary | ICD-10-CM | POA: Diagnosis not present

## 2018-02-27 LAB — POCT INR: INR: 3 (ref 2.0–3.0)

## 2018-03-10 DIAGNOSIS — N3000 Acute cystitis without hematuria: Secondary | ICD-10-CM | POA: Diagnosis not present

## 2018-03-10 DIAGNOSIS — N816 Rectocele: Secondary | ICD-10-CM | POA: Diagnosis not present

## 2018-03-10 DIAGNOSIS — M545 Low back pain: Secondary | ICD-10-CM | POA: Diagnosis not present

## 2018-03-10 DIAGNOSIS — R319 Hematuria, unspecified: Secondary | ICD-10-CM | POA: Diagnosis not present

## 2018-03-13 DIAGNOSIS — M5136 Other intervertebral disc degeneration, lumbar region: Secondary | ICD-10-CM | POA: Insufficient documentation

## 2018-03-13 DIAGNOSIS — M419 Scoliosis, unspecified: Secondary | ICD-10-CM | POA: Diagnosis not present

## 2018-03-13 DIAGNOSIS — M8580 Other specified disorders of bone density and structure, unspecified site: Secondary | ICD-10-CM | POA: Diagnosis not present

## 2018-03-13 DIAGNOSIS — M545 Low back pain: Secondary | ICD-10-CM | POA: Diagnosis not present

## 2018-03-13 DIAGNOSIS — M4310 Spondylolisthesis, site unspecified: Secondary | ICD-10-CM | POA: Diagnosis not present

## 2018-03-15 ENCOUNTER — Other Ambulatory Visit: Payer: Self-pay | Admitting: Cardiovascular Disease

## 2018-03-27 DIAGNOSIS — M5416 Radiculopathy, lumbar region: Secondary | ICD-10-CM | POA: Diagnosis not present

## 2018-03-28 ENCOUNTER — Ambulatory Visit (INDEPENDENT_AMBULATORY_CARE_PROVIDER_SITE_OTHER): Payer: PPO | Admitting: Pharmacist

## 2018-03-28 DIAGNOSIS — Z952 Presence of prosthetic heart valve: Secondary | ICD-10-CM | POA: Diagnosis not present

## 2018-03-28 DIAGNOSIS — Z7901 Long term (current) use of anticoagulants: Secondary | ICD-10-CM

## 2018-03-28 LAB — POCT INR: INR: 2.4 (ref 2.0–3.0)

## 2018-03-30 DIAGNOSIS — B078 Other viral warts: Secondary | ICD-10-CM | POA: Diagnosis not present

## 2018-03-30 DIAGNOSIS — L820 Inflamed seborrheic keratosis: Secondary | ICD-10-CM | POA: Diagnosis not present

## 2018-03-30 DIAGNOSIS — M545 Low back pain: Secondary | ICD-10-CM | POA: Diagnosis not present

## 2018-04-03 DIAGNOSIS — M5416 Radiculopathy, lumbar region: Secondary | ICD-10-CM | POA: Diagnosis not present

## 2018-04-10 DIAGNOSIS — M545 Low back pain: Secondary | ICD-10-CM | POA: Diagnosis not present

## 2018-04-17 DIAGNOSIS — M545 Low back pain: Secondary | ICD-10-CM | POA: Diagnosis not present

## 2018-04-20 DIAGNOSIS — M545 Low back pain: Secondary | ICD-10-CM | POA: Diagnosis not present

## 2018-04-24 DIAGNOSIS — M5416 Radiculopathy, lumbar region: Secondary | ICD-10-CM | POA: Diagnosis not present

## 2018-04-27 DIAGNOSIS — M5416 Radiculopathy, lumbar region: Secondary | ICD-10-CM | POA: Diagnosis not present

## 2018-04-27 DIAGNOSIS — M545 Low back pain: Secondary | ICD-10-CM | POA: Diagnosis not present

## 2018-05-04 ENCOUNTER — Telehealth: Payer: Self-pay | Admitting: Pharmacist

## 2018-05-04 NOTE — Telephone Encounter (Signed)
Coumadin clinc appt moved 2 weeks out at the [facility] location.

## 2018-05-22 ENCOUNTER — Telehealth: Payer: Self-pay

## 2018-05-22 NOTE — Telephone Encounter (Signed)

## 2018-05-23 ENCOUNTER — Other Ambulatory Visit: Payer: Self-pay

## 2018-05-23 ENCOUNTER — Ambulatory Visit (INDEPENDENT_AMBULATORY_CARE_PROVIDER_SITE_OTHER): Payer: PPO | Admitting: Pharmacist

## 2018-05-23 DIAGNOSIS — Z7901 Long term (current) use of anticoagulants: Secondary | ICD-10-CM

## 2018-05-23 DIAGNOSIS — Z952 Presence of prosthetic heart valve: Secondary | ICD-10-CM | POA: Diagnosis not present

## 2018-05-23 LAB — POCT INR: INR: 2.4 (ref 2.0–3.0)

## 2018-05-24 DIAGNOSIS — I2510 Atherosclerotic heart disease of native coronary artery without angina pectoris: Secondary | ICD-10-CM | POA: Diagnosis not present

## 2018-05-24 DIAGNOSIS — I10 Essential (primary) hypertension: Secondary | ICD-10-CM | POA: Diagnosis not present

## 2018-05-24 DIAGNOSIS — E7800 Pure hypercholesterolemia, unspecified: Secondary | ICD-10-CM | POA: Diagnosis not present

## 2018-05-24 DIAGNOSIS — F4321 Adjustment disorder with depressed mood: Secondary | ICD-10-CM | POA: Diagnosis not present

## 2018-06-08 DIAGNOSIS — Z0000 Encounter for general adult medical examination without abnormal findings: Secondary | ICD-10-CM | POA: Diagnosis not present

## 2018-06-08 DIAGNOSIS — Z1389 Encounter for screening for other disorder: Secondary | ICD-10-CM | POA: Diagnosis not present

## 2018-06-08 DIAGNOSIS — I10 Essential (primary) hypertension: Secondary | ICD-10-CM | POA: Diagnosis not present

## 2018-06-08 DIAGNOSIS — K219 Gastro-esophageal reflux disease without esophagitis: Secondary | ICD-10-CM | POA: Diagnosis not present

## 2018-06-08 DIAGNOSIS — I359 Nonrheumatic aortic valve disorder, unspecified: Secondary | ICD-10-CM | POA: Diagnosis not present

## 2018-06-08 DIAGNOSIS — M899 Disorder of bone, unspecified: Secondary | ICD-10-CM | POA: Diagnosis not present

## 2018-06-08 DIAGNOSIS — E7800 Pure hypercholesterolemia, unspecified: Secondary | ICD-10-CM | POA: Diagnosis not present

## 2018-06-08 DIAGNOSIS — R7309 Other abnormal glucose: Secondary | ICD-10-CM | POA: Diagnosis not present

## 2018-06-08 DIAGNOSIS — I2510 Atherosclerotic heart disease of native coronary artery without angina pectoris: Secondary | ICD-10-CM | POA: Diagnosis not present

## 2018-06-23 DIAGNOSIS — F4321 Adjustment disorder with depressed mood: Secondary | ICD-10-CM | POA: Diagnosis not present

## 2018-06-23 DIAGNOSIS — I10 Essential (primary) hypertension: Secondary | ICD-10-CM | POA: Diagnosis not present

## 2018-06-23 DIAGNOSIS — I2510 Atherosclerotic heart disease of native coronary artery without angina pectoris: Secondary | ICD-10-CM | POA: Diagnosis not present

## 2018-06-23 DIAGNOSIS — E7800 Pure hypercholesterolemia, unspecified: Secondary | ICD-10-CM | POA: Diagnosis not present

## 2018-07-04 DIAGNOSIS — E7800 Pure hypercholesterolemia, unspecified: Secondary | ICD-10-CM | POA: Diagnosis not present

## 2018-07-04 DIAGNOSIS — I10 Essential (primary) hypertension: Secondary | ICD-10-CM | POA: Diagnosis not present

## 2018-07-04 DIAGNOSIS — R7303 Prediabetes: Secondary | ICD-10-CM | POA: Diagnosis not present

## 2018-07-11 ENCOUNTER — Telehealth: Payer: Self-pay

## 2018-07-11 NOTE — Telephone Encounter (Signed)

## 2018-07-18 ENCOUNTER — Ambulatory Visit (INDEPENDENT_AMBULATORY_CARE_PROVIDER_SITE_OTHER): Payer: PPO

## 2018-07-18 ENCOUNTER — Other Ambulatory Visit: Payer: Self-pay

## 2018-07-18 DIAGNOSIS — Z7901 Long term (current) use of anticoagulants: Secondary | ICD-10-CM | POA: Diagnosis not present

## 2018-07-18 DIAGNOSIS — Z952 Presence of prosthetic heart valve: Secondary | ICD-10-CM | POA: Diagnosis not present

## 2018-07-18 LAB — POCT INR: INR: 3.4 — AB (ref 2.0–3.0)

## 2018-07-18 NOTE — Patient Instructions (Signed)
Description   Skip today's dosage of Coumadin, then resume same dosage 1 tablet daily except 1.5 tablets each Mondays and Fridays.  Repeat INR in 4 weeks.

## 2018-07-20 DIAGNOSIS — I10 Essential (primary) hypertension: Secondary | ICD-10-CM | POA: Diagnosis not present

## 2018-07-20 DIAGNOSIS — E7800 Pure hypercholesterolemia, unspecified: Secondary | ICD-10-CM | POA: Diagnosis not present

## 2018-07-20 DIAGNOSIS — I2510 Atherosclerotic heart disease of native coronary artery without angina pectoris: Secondary | ICD-10-CM | POA: Diagnosis not present

## 2018-07-20 DIAGNOSIS — F4321 Adjustment disorder with depressed mood: Secondary | ICD-10-CM | POA: Diagnosis not present

## 2018-08-14 ENCOUNTER — Telehealth: Payer: Self-pay

## 2018-08-14 NOTE — Telephone Encounter (Signed)

## 2018-08-15 ENCOUNTER — Ambulatory Visit (INDEPENDENT_AMBULATORY_CARE_PROVIDER_SITE_OTHER): Payer: PPO | Admitting: Pharmacist Clinician (PhC)/ Clinical Pharmacy Specialist

## 2018-08-15 ENCOUNTER — Other Ambulatory Visit: Payer: Self-pay

## 2018-08-15 DIAGNOSIS — I10 Essential (primary) hypertension: Secondary | ICD-10-CM | POA: Diagnosis not present

## 2018-08-15 DIAGNOSIS — I2510 Atherosclerotic heart disease of native coronary artery without angina pectoris: Secondary | ICD-10-CM | POA: Diagnosis not present

## 2018-08-15 DIAGNOSIS — Z7901 Long term (current) use of anticoagulants: Secondary | ICD-10-CM | POA: Diagnosis not present

## 2018-08-15 DIAGNOSIS — F4321 Adjustment disorder with depressed mood: Secondary | ICD-10-CM | POA: Diagnosis not present

## 2018-08-15 DIAGNOSIS — Z952 Presence of prosthetic heart valve: Secondary | ICD-10-CM

## 2018-08-15 DIAGNOSIS — E7800 Pure hypercholesterolemia, unspecified: Secondary | ICD-10-CM | POA: Diagnosis not present

## 2018-08-15 LAB — POCT INR: INR: 3.3 — AB (ref 2.0–3.0)

## 2018-09-08 DIAGNOSIS — Z1231 Encounter for screening mammogram for malignant neoplasm of breast: Secondary | ICD-10-CM | POA: Diagnosis not present

## 2018-09-13 ENCOUNTER — Ambulatory Visit (INDEPENDENT_AMBULATORY_CARE_PROVIDER_SITE_OTHER): Payer: PPO | Admitting: Pharmacist

## 2018-09-13 ENCOUNTER — Other Ambulatory Visit: Payer: Self-pay

## 2018-09-13 DIAGNOSIS — Z7901 Long term (current) use of anticoagulants: Secondary | ICD-10-CM

## 2018-09-13 DIAGNOSIS — Z952 Presence of prosthetic heart valve: Secondary | ICD-10-CM | POA: Diagnosis not present

## 2018-09-13 LAB — POCT INR: INR: 3.4 — AB (ref 2.0–3.0)

## 2018-10-04 ENCOUNTER — Ambulatory Visit (INDEPENDENT_AMBULATORY_CARE_PROVIDER_SITE_OTHER): Payer: PPO | Admitting: Pharmacist

## 2018-10-04 ENCOUNTER — Other Ambulatory Visit: Payer: Self-pay

## 2018-10-04 ENCOUNTER — Ambulatory Visit: Payer: PPO | Admitting: Cardiovascular Disease

## 2018-10-04 DIAGNOSIS — Z952 Presence of prosthetic heart valve: Secondary | ICD-10-CM | POA: Diagnosis not present

## 2018-10-04 DIAGNOSIS — Z7901 Long term (current) use of anticoagulants: Secondary | ICD-10-CM | POA: Diagnosis not present

## 2018-10-04 LAB — POCT INR: INR: 2.6 (ref 2.0–3.0)

## 2018-11-02 ENCOUNTER — Ambulatory Visit (INDEPENDENT_AMBULATORY_CARE_PROVIDER_SITE_OTHER): Payer: PPO | Admitting: Pharmacist

## 2018-11-02 ENCOUNTER — Other Ambulatory Visit: Payer: Self-pay

## 2018-11-02 ENCOUNTER — Encounter: Payer: Self-pay | Admitting: Cardiovascular Disease

## 2018-11-02 ENCOUNTER — Ambulatory Visit (INDEPENDENT_AMBULATORY_CARE_PROVIDER_SITE_OTHER): Payer: PPO | Admitting: Cardiovascular Disease

## 2018-11-02 VITALS — BP 128/68 | HR 59 | Temp 97.2°F | Ht 66.0 in | Wt 187.2 lb

## 2018-11-02 DIAGNOSIS — Z952 Presence of prosthetic heart valve: Secondary | ICD-10-CM | POA: Diagnosis not present

## 2018-11-02 DIAGNOSIS — E7800 Pure hypercholesterolemia, unspecified: Secondary | ICD-10-CM

## 2018-11-02 DIAGNOSIS — Z7901 Long term (current) use of anticoagulants: Secondary | ICD-10-CM

## 2018-11-02 DIAGNOSIS — I10 Essential (primary) hypertension: Secondary | ICD-10-CM | POA: Diagnosis not present

## 2018-11-02 DIAGNOSIS — I2510 Atherosclerotic heart disease of native coronary artery without angina pectoris: Secondary | ICD-10-CM

## 2018-11-02 DIAGNOSIS — F4321 Adjustment disorder with depressed mood: Secondary | ICD-10-CM | POA: Diagnosis not present

## 2018-11-02 LAB — POCT INR: INR: 2.6 (ref 2.0–3.0)

## 2018-11-02 NOTE — Progress Notes (Signed)
Cardiology Office Note    Date:  11/05/2018   ID:  JDA HICKENBOTTOM, DOB 1940-07-08, MRN [id number]  PCP:  Blair Heys, MD  Cardiologist:   Thurmon Fair, MD   Chief Complaint  Patient presents with  . Cardiac Valve Problem    Mechanical aortic valve prosthesis    History of Present Illness:  Sharon Rose is a 78 y.o. female who presents for f/u of mechanical AVR and hyperlipidemia and HTN.  She is done quite well and has had an uneventful year since her last appointment.  The patient specifically denies any chest pain at rest exertion, dyspnea at rest or with exertion, orthopnea, paroxysmal nocturnal dyspnea, syncope, palpitations, focal neurological deficits, intermittent claudication, lower extremity edema, unexplained weight gain, cough, hemoptysis or wheezing.  Echocardiogram on July 29, 2017-normal left ventricular ejection fraction 55-60% and normal diastolic parameters, normal valve function with mean gradient 17 mmHg (unchanged from 2014 and 2017).  The patient specifically denies any chest pain at rest exertion, dyspnea at rest or with exertion, orthopnea, paroxysmal nocturnal dyspnea, syncope, palpitations, focal neurological deficits, intermittent claudication, lower extremity edema, unexplained weight gain, cough, hemoptysis or wheezing.  She has a history of ankle swelling when she took amlodipine in the past.  She has not had any recent falls, injuries or bleeding.  Her INR is 2.6 today.  Mrs. Gelber  had replacement of a severely stenotic, probably congenitally bicuspid aortic valve in 2006 with a 23 mm mechanical valve. She has HTN and dyslipidemia that is treated with a statin. Coronary angiography before surgery showed total occlusion of the mid second oblique marginal artery by preoperative coronary angiography in 2006, but no angina and no evidence of ischemia by nuclear stress testing. Repeat coronary angiography in March 2011 did not confirm occlusion of the  second oblique marginal branch. She has normal LV function and no history of embolic events. In 2014 she presented with thiazide induced hypokalemia and bradycardia. Her heart rate normalized after electrolyte correction. We tried to stop her diuretic completely, but this was associated with ankle swelling and difficult to control blood pressure. She has a history of postoperative atrial fibrillation without detected recurrence since then. Normal 30 day event monitor in July 2017.  Past Medical History:  Diagnosis Date  . CAD (coronary artery disease)    NUCLEAR STRESS TEST, 10/07/2008 - no ischemia  . Dyspnea on exertion   . H/O aortic valve replacement    2D ECHO, 04/26/2011 - EF 50-55%, normal-mild  . Hypertension   . NSTEMI (non-ST elevated myocardial infarction) (HCC) 12/2004   Secondary to occlusion of OM 2, small vessel and medical therapy recommended; cath 2011 with no CAD    Past Surgical History:  Procedure Laterality Date  . AORTIC VALVE REPLACEMENT  09/12/2004   St. Jude mechanical aortic valve, model #23AK-501, serial [id number]  . CARDIAC CATHETERIZATION Left 03/25/2009   Normal cors  . CARDIAC CATHETERIZATION  01/20/2005   Medical management for occluded and possibly revascularized OM 2, string sign seen  . CARDIAC CATHETERIZATION  08/11/2004   Elective aortic valve replacement, severe aortic valvre stenosis with functional class II to III symptomatology    Current Medications: Outpatient Medications Prior to Visit  Medication Sig Dispense Refill  . acetaminophen (TYLENOL) 500 MG tablet Take 1,000 mg by mouth every 6 (six) hours as needed.    Marland Kitchen aspirin EC 81 MG tablet Take 81 mg by mouth daily.    . calcium carbonate (OS-CAL) 600 MG  TABS Take 600 mg by mouth 2 (two) times daily with a meal.    . losartan (COZAAR) 100 MG tablet losartan 100 mg tablet    . Multiple Vitamin (MULTIVITAMIN) tablet Take 1 tablet by mouth daily.    Marland Kitchen omeprazole (PRILOSEC) 20 MG capsule Take 20 mg  by mouth daily.    . rosuvastatin (CRESTOR) 5 MG tablet Take 5 mg by mouth daily.    Marland Kitchen warfarin (COUMADIN) 5 MG tablet TAKE 1 TO 1 AND 1/2 TABLETS BY MOUTH EVERY DAY AS DIRECTED BY COUMADIN CLINIC 130 tablet 1  . losartan-hydrochlorothiazide (HYZAAR) 100-25 MG tablet Take 1 tablet by mouth daily.     No facility-administered medications prior to visit.      Allergies:   Codeine, Statins, Sulfa antibiotics, and Verapamil   Social History   Socioeconomic History  . Marital status: Widowed    Spouse name: Not on file  . Number of children: Not on file  . Years of education: Not on file  . Highest education level: Not on file  Occupational History  . Not on file  Social Needs  . Financial resource strain: Not on file  . Food insecurity    Worry: Not on file    Inability: Not on file  . Transportation needs    Medical: Not on file    Non-medical: Not on file  Tobacco Use  . Smoking status: Never Smoker  . Smokeless tobacco: Never Used  Substance and Sexual Activity  . Alcohol use: No  . Drug use: No  . Sexual activity: Not on file  Lifestyle  . Physical activity    Days per week: Not on file    Minutes per session: Not on file  . Stress: Not on file  Relationships  . Social Musician on phone: Not on file    Gets together: Not on file    Attends religious service: Not on file    Active member of club or organization: Not on file    Attends meetings of clubs or organizations: Not on file    Relationship status: Not on file  Other Topics Concern  . Not on file  Social History Narrative  . Not on file      ROS:   Please see the history of present illness.    ROS all other systems are reviewed and are negative  PHYSICAL EXAM:   VS:  BP 128/68   Pulse (!) 59   Temp (!) 97.2 F (36.2 C)   Ht 5\' 6"  (1.676 m)   Wt 187 lb 3.2 oz (84.9 kg)   SpO2 96%   BMI 30.21 kg/m       General: Alert, oriented x3, no distress, borderline obese Head: no evidence  of trauma, PERRL, EOMI, no exophtalmos or lid lag, no myxedema, no xanthelasma; normal ears, nose and oropharynx Neck: normal jugular venous pulsations and no hepatojugular reflux; brisk carotid pulses without delay and no carotid bruits Chest: clear to auscultation, no signs of consolidation by percussion or palpation, normal fremitus, symmetrical and full respiratory excursions Cardiovascular: normal position and quality of the apical impulse, regular rhythm, crisp mechanical valve sounds, early peaking 2/6 aortic ejection murmur no diastolic murmurs, rubs or gallops Abdomen: no tenderness or distention, no masses by palpation, no abnormal pulsatility or arterial bruits, normal bowel sounds, no hepatosplenomegaly Extremities: no clubbing, cyanosis or edema; 2+ radial, ulnar and brachial pulses bilaterally; 2+ right femoral, posterior tibial and dorsalis pedis  pulses; 2+ left femoral, posterior tibial and dorsalis pedis pulses; no subclavian or femoral bruits Neurological: grossly nonfocal Psych: Normal mood and affect   Wt Readings from Last 3 Encounters:  11/02/18 187 lb 3.2 oz (84.9 kg)  09/27/17 190 lb 6.4 oz (86.4 kg)  07/18/17 191 lb (86.6 kg)      Studies/Labs Reviewed:   EKG:  EKG is ordered today.  It shows sinus rhythm, suspicion for second-degree atrioventricular block Mobitz type I, but PACs interrupted sequencing is hard to make a firm diagnosis.  The PR interval is definitely prolonged  LABS: 07/04/2018 Total cholesterol 152, HDL 56, LDL 74, triglycerides 108 Hemoglobin A1c 5.7% Creatinine 0.77, potassium 3.7, normal liver function tests  ASSESSMENT:    1. Hx of aortic valve replacement, mechanical   2. Essential hypertension   3. Hypercholesterolemia   4. Long term current use of anticoagulant   5. Coronary artery disease involving native coronary artery of native heart without angina pectoris      PLAN:  In order of problems listed above:  1. S/p AVR: No  evidence of prosthetic valve dysfunction by clinical exam.  Normal gradients on echo last year, stable over many years. 2. HTN: Well-controlled 3. HLP: Lipid profile is acceptable.  The diagnosis of CAD is doubtful. 4. Warfarin: Consistently therapeutic INR.  No bleeding complications. 5. CAD: Doubtful diagnosis.  Previous catheterization reported total occlusion of the mid second oblique marginal artery in 2006, but repeat angiography in 2011 did not confirm this abnormality. She has never had angina pectoris.    Medication Adjustments/Labs and Tests Ordered: Current medicines are reviewed at length with the patient today.  Concerns regarding medicines are outlined above.  Medication changes, Labs and Tests ordered today are listed in the Patient Instructions below. Patient Instructions  Medication Instructions:  Your physician recommends that you continue on your current medications as directed. Please refer to the Current Medication list given to you today.  If you need a refill on your cardiac medications before your next appointment, please call your pharmacy.   Lab work: None ordered If you have labs (blood work) drawn today and your tests are completely normal, you will receive your results only by: MyChart Message (if you have MyChart) OR A paper copy in the mail If you have any lab test that is abnormal or we need to change your treatment, we will call you to review the results.  Testing/Procedures: None ordered  Follow-Up: At [facility], you and your health needs are our priority.  As part of our continuing mission to provide you with exceptional heart care, we have created designated Provider Care Teams.  These Care Teams include your primary Cardiologist (physician) and Advanced Practice Providers (APPs -  Physician Assistants and Nurse Practitioners) who all work together to provide you with the care you need, when you need it. You will need a follow up appointment in 12  months.  Please call our office 2 months in advance to schedule this appointment.  You may see Thurmon Fair, MD or one of the following Advanced Practice Providers on your designated Care Team: Azalee Course, PA-C Micah Flesher, [place]          Signed, Thurmon Fair, MD  11/05/2018 11:23 AM    [facility] [address] [place], [place], [place]  [address] Phone: [phone]; Fax: [phone]

## 2018-11-02 NOTE — Patient Instructions (Signed)

## 2018-11-05 ENCOUNTER — Encounter: Payer: Self-pay | Admitting: Cardiovascular Disease

## 2018-11-09 DIAGNOSIS — H26492 Other secondary cataract, left eye: Secondary | ICD-10-CM | POA: Diagnosis not present

## 2018-11-09 DIAGNOSIS — Z961 Presence of intraocular lens: Secondary | ICD-10-CM | POA: Diagnosis not present

## 2018-11-09 DIAGNOSIS — H04123 Dry eye syndrome of bilateral lacrimal glands: Secondary | ICD-10-CM | POA: Diagnosis not present

## 2018-12-11 DIAGNOSIS — I2510 Atherosclerotic heart disease of native coronary artery without angina pectoris: Secondary | ICD-10-CM | POA: Diagnosis not present

## 2018-12-11 DIAGNOSIS — R7309 Other abnormal glucose: Secondary | ICD-10-CM | POA: Diagnosis not present

## 2018-12-11 DIAGNOSIS — R8290 Unspecified abnormal findings in urine: Secondary | ICD-10-CM | POA: Diagnosis not present

## 2018-12-11 DIAGNOSIS — I359 Nonrheumatic aortic valve disorder, unspecified: Secondary | ICD-10-CM | POA: Diagnosis not present

## 2018-12-11 DIAGNOSIS — M899 Disorder of bone, unspecified: Secondary | ICD-10-CM | POA: Diagnosis not present

## 2018-12-11 DIAGNOSIS — D692 Other nonthrombocytopenic purpura: Secondary | ICD-10-CM | POA: Diagnosis not present

## 2018-12-11 DIAGNOSIS — E7800 Pure hypercholesterolemia, unspecified: Secondary | ICD-10-CM | POA: Diagnosis not present

## 2018-12-11 DIAGNOSIS — I10 Essential (primary) hypertension: Secondary | ICD-10-CM | POA: Diagnosis not present

## 2018-12-11 DIAGNOSIS — M519 Unspecified thoracic, thoracolumbar and lumbosacral intervertebral disc disorder: Secondary | ICD-10-CM | POA: Diagnosis not present

## 2018-12-11 DIAGNOSIS — K2100 Gastro-esophageal reflux disease with esophagitis, without bleeding: Secondary | ICD-10-CM | POA: Diagnosis not present

## 2018-12-11 DIAGNOSIS — I208 Other forms of angina pectoris: Secondary | ICD-10-CM | POA: Diagnosis not present

## 2018-12-14 ENCOUNTER — Other Ambulatory Visit: Payer: Self-pay

## 2018-12-14 ENCOUNTER — Ambulatory Visit (INDEPENDENT_AMBULATORY_CARE_PROVIDER_SITE_OTHER): Payer: PPO | Admitting: Pharmacist Clinician (PhC)/ Clinical Pharmacy Specialist

## 2018-12-14 DIAGNOSIS — Z7901 Long term (current) use of anticoagulants: Secondary | ICD-10-CM | POA: Diagnosis not present

## 2018-12-14 DIAGNOSIS — Z952 Presence of prosthetic heart valve: Secondary | ICD-10-CM

## 2018-12-14 LAB — POCT INR: INR: 2.1 (ref 2.0–3.0)

## 2018-12-19 DIAGNOSIS — Z209 Contact with and (suspected) exposure to unspecified communicable disease: Secondary | ICD-10-CM | POA: Diagnosis not present

## 2018-12-20 DIAGNOSIS — Z209 Contact with and (suspected) exposure to unspecified communicable disease: Secondary | ICD-10-CM | POA: Diagnosis not present

## 2019-01-17 DIAGNOSIS — N816 Rectocele: Secondary | ICD-10-CM | POA: Diagnosis not present

## 2019-01-17 DIAGNOSIS — Z01411 Encounter for gynecological examination (general) (routine) with abnormal findings: Secondary | ICD-10-CM | POA: Diagnosis not present

## 2019-01-31 ENCOUNTER — Other Ambulatory Visit: Payer: Self-pay

## 2019-01-31 ENCOUNTER — Ambulatory Visit (INDEPENDENT_AMBULATORY_CARE_PROVIDER_SITE_OTHER): Payer: PPO | Admitting: Pharmacist

## 2019-01-31 DIAGNOSIS — Z952 Presence of prosthetic heart valve: Secondary | ICD-10-CM | POA: Diagnosis not present

## 2019-01-31 DIAGNOSIS — Z7901 Long term (current) use of anticoagulants: Secondary | ICD-10-CM | POA: Diagnosis not present

## 2019-01-31 LAB — POCT INR: INR: 2.3 (ref 2.0–3.0)

## 2019-03-26 ENCOUNTER — Ambulatory Visit: Payer: PPO | Attending: Internal Medicine

## 2019-03-26 ENCOUNTER — Other Ambulatory Visit: Payer: Self-pay | Admitting: Cardiovascular Disease

## 2019-03-26 DIAGNOSIS — Z23 Encounter for immunization: Secondary | ICD-10-CM | POA: Insufficient documentation

## 2019-03-26 NOTE — Progress Notes (Signed)
Covid-19 Vaccination Clinic  Name:  Sharon Rose    MRN: [id number] DOB: 08-Nov-1940  03/26/2019  [name] was observed post Covid-19 immunization for 15 minutes without incidence. She was provided with Vaccine Information Sheet and instruction to access the V-Safe system.   [name] was instructed to call 911 with any severe reactions post vaccine: Marland Kitchen Difficulty breathing  . Swelling of your face and throat  . A fast heartbeat  . A bad rash all over your body  . Dizziness and weakness    Immunizations Administered    Name Date Dose VIS Date Route   Pfizer COVID-19 Vaccine 03/26/2019  9:20 AM 0.3 mL 01/05/2019 Intramuscular   Manufacturer: [employer], [employer]   Lot: [id number]   NDC: [address]-6

## 2019-03-28 ENCOUNTER — Other Ambulatory Visit: Payer: Self-pay

## 2019-03-28 ENCOUNTER — Ambulatory Visit (INDEPENDENT_AMBULATORY_CARE_PROVIDER_SITE_OTHER): Payer: PPO | Admitting: Pharmacist

## 2019-03-28 DIAGNOSIS — Z952 Presence of prosthetic heart valve: Secondary | ICD-10-CM

## 2019-03-28 DIAGNOSIS — Z7901 Long term (current) use of anticoagulants: Secondary | ICD-10-CM

## 2019-03-28 LAB — POCT INR: INR: 1.9 — AB (ref 2.0–3.0)

## 2019-04-10 DIAGNOSIS — N3941 Urge incontinence: Secondary | ICD-10-CM | POA: Diagnosis not present

## 2019-04-10 DIAGNOSIS — K469 Unspecified abdominal hernia without obstruction or gangrene: Secondary | ICD-10-CM | POA: Diagnosis not present

## 2019-04-10 DIAGNOSIS — N952 Postmenopausal atrophic vaginitis: Secondary | ICD-10-CM | POA: Diagnosis not present

## 2019-04-10 DIAGNOSIS — R82998 Other abnormal findings in urine: Secondary | ICD-10-CM | POA: Diagnosis not present

## 2019-04-10 DIAGNOSIS — N816 Rectocele: Secondary | ICD-10-CM | POA: Diagnosis not present

## 2019-04-24 ENCOUNTER — Ambulatory Visit: Payer: PPO | Attending: Internal Medicine

## 2019-04-24 DIAGNOSIS — Z23 Encounter for immunization: Secondary | ICD-10-CM

## 2019-04-24 NOTE — Progress Notes (Signed)
Covid-19 Vaccination Clinic  Name:  MAHELET BLOMGREN    MRN: [id number] DOB: 1940/07/23  04/24/2019  Ms. Luecke was observed post Covid-19 immunization for 15 minutes without incident. She was provided with Vaccine Information Sheet and instruction to access the V-Safe system.   Ms. Hirzel was instructed to call 911 with any severe reactions post vaccine: Marland Kitchen Difficulty breathing  . Swelling of face and throat  . A fast heartbeat  . A bad rash all over body  . Dizziness and weakness   Immunizations Administered    Name Date Dose VIS Date Route   Pfizer COVID-19 Vaccine 04/24/2019  8:58 AM 0.3 mL 01/05/2019 Intramuscular   Manufacturer: [employer], [employer]   Lot: [id number]   NDC: [address]-2

## 2019-05-02 ENCOUNTER — Ambulatory Visit (INDEPENDENT_AMBULATORY_CARE_PROVIDER_SITE_OTHER): Payer: PPO | Admitting: Pharmacist Clinician (PhC)/ Clinical Pharmacy Specialist

## 2019-05-02 ENCOUNTER — Other Ambulatory Visit: Payer: Self-pay

## 2019-05-02 DIAGNOSIS — Z7901 Long term (current) use of anticoagulants: Secondary | ICD-10-CM | POA: Diagnosis not present

## 2019-05-02 DIAGNOSIS — Z952 Presence of prosthetic heart valve: Secondary | ICD-10-CM

## 2019-05-02 LAB — POCT INR: INR: 1.9 — AB (ref 2.0–3.0)

## 2019-05-30 ENCOUNTER — Other Ambulatory Visit: Payer: Self-pay

## 2019-05-30 ENCOUNTER — Ambulatory Visit (INDEPENDENT_AMBULATORY_CARE_PROVIDER_SITE_OTHER): Payer: PPO | Admitting: *Deleted

## 2019-05-30 DIAGNOSIS — Z5181 Encounter for therapeutic drug level monitoring: Secondary | ICD-10-CM

## 2019-05-30 DIAGNOSIS — Z7901 Long term (current) use of anticoagulants: Secondary | ICD-10-CM

## 2019-05-30 DIAGNOSIS — Z952 Presence of prosthetic heart valve: Secondary | ICD-10-CM

## 2019-05-30 LAB — POCT INR: INR: 2 (ref 2.0–3.0)

## 2019-05-30 NOTE — Patient Instructions (Addendum)
Description   Continue same dose 1 tablet daily except 1.5 tablets each Wednesday.  Repeat INR in 5 weeks.

## 2019-06-11 DIAGNOSIS — Z0000 Encounter for general adult medical examination without abnormal findings: Secondary | ICD-10-CM | POA: Diagnosis not present

## 2019-06-11 DIAGNOSIS — R7303 Prediabetes: Secondary | ICD-10-CM | POA: Diagnosis not present

## 2019-06-11 DIAGNOSIS — E7800 Pure hypercholesterolemia, unspecified: Secondary | ICD-10-CM | POA: Diagnosis not present

## 2019-06-11 DIAGNOSIS — K2100 Gastro-esophageal reflux disease with esophagitis, without bleeding: Secondary | ICD-10-CM | POA: Diagnosis not present

## 2019-06-11 DIAGNOSIS — M899 Disorder of bone, unspecified: Secondary | ICD-10-CM | POA: Diagnosis not present

## 2019-06-11 DIAGNOSIS — I208 Other forms of angina pectoris: Secondary | ICD-10-CM | POA: Diagnosis not present

## 2019-06-11 DIAGNOSIS — I10 Essential (primary) hypertension: Secondary | ICD-10-CM | POA: Diagnosis not present

## 2019-06-11 DIAGNOSIS — Z1389 Encounter for screening for other disorder: Secondary | ICD-10-CM | POA: Diagnosis not present

## 2019-06-11 DIAGNOSIS — I2510 Atherosclerotic heart disease of native coronary artery without angina pectoris: Secondary | ICD-10-CM | POA: Diagnosis not present

## 2019-06-11 DIAGNOSIS — M859 Disorder of bone density and structure, unspecified: Secondary | ICD-10-CM | POA: Diagnosis not present

## 2019-06-11 DIAGNOSIS — I359 Nonrheumatic aortic valve disorder, unspecified: Secondary | ICD-10-CM | POA: Diagnosis not present

## 2019-06-29 DIAGNOSIS — D225 Melanocytic nevi of trunk: Secondary | ICD-10-CM | POA: Diagnosis not present

## 2019-06-29 DIAGNOSIS — L821 Other seborrheic keratosis: Secondary | ICD-10-CM | POA: Diagnosis not present

## 2019-06-29 DIAGNOSIS — D230 Other benign neoplasm of skin of lip: Secondary | ICD-10-CM | POA: Diagnosis not present

## 2019-06-29 DIAGNOSIS — D485 Neoplasm of uncertain behavior of skin: Secondary | ICD-10-CM | POA: Diagnosis not present

## 2019-06-29 DIAGNOSIS — C44519 Basal cell carcinoma of skin of other part of trunk: Secondary | ICD-10-CM | POA: Diagnosis not present

## 2019-07-01 ENCOUNTER — Emergency Department (HOSPITAL_COMMUNITY): Payer: PPO

## 2019-07-01 ENCOUNTER — Encounter (HOSPITAL_COMMUNITY): Payer: Self-pay | Admitting: Emergency Medicine

## 2019-07-01 ENCOUNTER — Other Ambulatory Visit: Payer: Self-pay

## 2019-07-01 ENCOUNTER — Emergency Department (HOSPITAL_COMMUNITY)
Admission: EM | Admit: 2019-07-01 | Discharge: 2019-07-01 | Disposition: A | Discharge status: Home / Self Care | Payer: PPO | Attending: Emergency Medicine | Admitting: Emergency Medicine

## 2019-07-01 DIAGNOSIS — I2510 Atherosclerotic heart disease of native coronary artery without angina pectoris: Secondary | ICD-10-CM | POA: Insufficient documentation

## 2019-07-01 DIAGNOSIS — R1110 Vomiting, unspecified: Secondary | ICD-10-CM | POA: Diagnosis not present

## 2019-07-01 DIAGNOSIS — Z79899 Other long term (current) drug therapy: Secondary | ICD-10-CM | POA: Diagnosis not present

## 2019-07-01 DIAGNOSIS — R0602 Shortness of breath: Secondary | ICD-10-CM | POA: Insufficient documentation

## 2019-07-01 DIAGNOSIS — R0789 Other chest pain: Secondary | ICD-10-CM

## 2019-07-01 DIAGNOSIS — R112 Nausea with vomiting, unspecified: Secondary | ICD-10-CM | POA: Diagnosis not present

## 2019-07-01 DIAGNOSIS — J984 Other disorders of lung: Secondary | ICD-10-CM | POA: Diagnosis not present

## 2019-07-01 DIAGNOSIS — R072 Precordial pain: Secondary | ICD-10-CM | POA: Diagnosis not present

## 2019-07-01 DIAGNOSIS — K449 Diaphragmatic hernia without obstruction or gangrene: Secondary | ICD-10-CM | POA: Diagnosis not present

## 2019-07-01 DIAGNOSIS — Z7982 Long term (current) use of aspirin: Secondary | ICD-10-CM | POA: Insufficient documentation

## 2019-07-01 DIAGNOSIS — Z7901 Long term (current) use of anticoagulants: Secondary | ICD-10-CM | POA: Diagnosis not present

## 2019-07-01 DIAGNOSIS — I10 Essential (primary) hypertension: Secondary | ICD-10-CM | POA: Diagnosis not present

## 2019-07-01 DIAGNOSIS — R110 Nausea: Secondary | ICD-10-CM | POA: Diagnosis not present

## 2019-07-01 LAB — TROPONIN I (HIGH SENSITIVITY)
Troponin I (High Sensitivity): 4 ng/L (ref <18)
Troponin I (High Sensitivity): 6 ng/L (ref <18)

## 2019-07-01 LAB — HEPATIC FUNCTION PANEL
ALT: 17 U/L (ref 0–44)
AST: 29 U/L (ref 15–41)
Albumin: 4.2 g/dL (ref 3.5–5.0)
Alkaline Phosphatase: 60 U/L (ref 38–126)
Bilirubin, Direct: 0.2 mg/dL (ref 0.0–0.2)
Indirect Bilirubin: 0.7 mg/dL (ref 0.3–0.9)
Total Bilirubin: 0.9 mg/dL (ref 0.3–1.2)
Total Protein: 7.1 g/dL (ref 6.5–8.1)

## 2019-07-01 LAB — BASIC METABOLIC PANEL
Anion gap: 15 (ref 5–15)
BUN: 15 mg/dL (ref 8–23)
CO2: 26 mmol/L (ref 22–32)
Calcium: 10.2 mg/dL (ref 8.9–10.3)
Chloride: 98 mmol/L (ref 98–111)
Creatinine, Ser: 0.85 mg/dL (ref 0.44–1.00)
GFR calc Af Amer: >60 mL/min (ref >60)
GFR calc non Af Amer: >60 mL/min (ref >60)
Glucose, Bld: 109 mg/dL — ABNORMAL HIGH (ref 70–99)
Potassium: 3.5 mmol/L (ref 3.5–5.1)
Sodium: 139 mmol/L (ref 135–145)

## 2019-07-01 LAB — CBC
HCT: 40.8 % (ref 36.0–46.0)
Hemoglobin: 13.8 g/dL (ref 12.0–15.0)
MCH: 31.7 pg (ref 26.0–34.0)
MCHC: 33.8 g/dL (ref 30.0–36.0)
MCV: 93.8 fL (ref 80.0–100.0)
Platelets: 325 10*3/uL (ref 150–400)
RBC: 4.35 MIL/uL (ref 3.87–5.11)
RDW: 12.2 % (ref 11.5–15.5)
WBC: 4.9 10*3/uL (ref 4.0–10.5)
nRBC: 0 % (ref 0.0–0.2)

## 2019-07-01 LAB — LIPASE, BLOOD: Lipase: 32 U/L (ref 11–51)

## 2019-07-01 LAB — PROTIME-INR
INR: 1.9 — ABNORMAL HIGH (ref 0.8–1.2)
Prothrombin Time: 21.1 seconds — ABNORMAL HIGH (ref 11.4–15.2)

## 2019-07-01 MED ORDER — SODIUM CHLORIDE 0.9% FLUSH
3.0000 mL | Freq: Once | INTRAVENOUS | Status: AC
  Administered 2019-07-01: 3 mL via INTRAVENOUS

## 2019-07-01 MED ORDER — SODIUM CHLORIDE 0.9 % IV BOLUS
500.0000 mL | Freq: Once | INTRAVENOUS | Status: AC
  Administered 2019-07-01: 500 mL via INTRAVENOUS

## 2019-07-01 NOTE — ED Triage Notes (Signed)
C/o generalized chest "tightness" and SOB that started while in church.  Family member noticed pt was sitting down fanning herself.   States she felt hot all over.  States pain has decreased.  Denies nausea and vomiting.

## 2019-07-01 NOTE — Discharge Instructions (Addendum)
Follow-up with cardiology.  Please return to the ED if symptoms worsen.

## 2019-07-01 NOTE — ED Provider Notes (Signed)
[facility] EMERGENCY DEPARTMENT Provider Note   CSN: [id number] Arrival date & time: 07/01/19  4098     History Chief Complaint  Patient presents with  . Chest Pain    Sharon Rose is a 79 y.o. female.  Patient with history of heart disease/CAD, aortic valve replacement on Coumadin who presents to the ED after having episode of chest pressure, diaphoresis, some shortness of breath.  Just states that she does not feel well.  States that she does not have any abdominal pain, no nausea, no vomiting.  No recent surgeries or hospitalizations.  Follows with cardiology.  Has not had cardiac catheterization or stress test in several years.  States that she has had some discomfort frequently over the last several weeks and months but usually talks it up to gas related pain.  She states that how she felt this morning was different than what she thinks is her gas pain.  Person in the room with her states that felt like that her heart rate was erratic from being in the 40s to the 60s.  The history is provided by the patient.  Chest Pain Pain location:  Substernal area Pain quality: pressure   Pain radiates to:  Does not radiate Pain severity:  Mild Onset quality:  Sudden Timing:  Constant Progression:  Improving Chronicity:  New Context: at rest   Relieved by:  Nothing Worsened by:  Nothing Associated symptoms: diaphoresis   Associated symptoms: no abdominal pain, no altered mental status, no anorexia, no anxiety, no back pain, no cough, no dizziness, no fever, no nausea, no palpitations, no shortness of breath and no vomiting   Risk factors: coronary artery disease and hypertension        Past Medical History:  Diagnosis Date  . CAD (coronary artery disease)    NUCLEAR STRESS TEST, 10/07/2008 - no ischemia  . Dyspnea on exertion   . H/O aortic valve replacement    2D ECHO, 04/26/2011 - EF 50-55%, normal-mild  . Hypertension   . NSTEMI (non-ST elevated myocardial  infarction) ([place]) 12/2004   Secondary to occlusion of OM 2, small vessel and medical therapy recommended; cath 2011 with no CAD    Patient Active Problem List   Diagnosis Date Noted  . History of mechanical aortic valve replacement 07/08/2014  . Palpitations 07/08/2014  . CAD (coronary artery disease) 07/11/2012  . Hypercholesterolemia 07/11/2012  . Symptomatic bradycardia 07/09/2012  . Hypokalemia 07/09/2012  . Essential hypertension 07/09/2012  . Long term current use of anticoagulant 04/13/2012  . H/O aortic valve replacement 04/13/2012    Past Surgical History:  Procedure Laterality Date  . AORTIC VALVE REPLACEMENT  09/12/2004   St. Jude mechanical aortic valve, model #23AK-501, serial [id number]  . CARDIAC CATHETERIZATION Left 03/25/2009   Normal cors  . CARDIAC CATHETERIZATION  01/20/2005   Medical management for occluded and possibly revascularized OM 2, string sign seen  . CARDIAC CATHETERIZATION  08/11/2004   Elective aortic valve replacement, severe aortic valvre stenosis with functional class II to III symptomatology     OB History   No obstetric history on file.     No family history on file.  Social History   Tobacco Use  . Smoking status: Never Smoker  . Smokeless tobacco: Never Used  Substance Use Topics  . Alcohol use: No  . Drug use: No    Home Medications Prior to Admission medications   Medication Sig Start Date End Date Taking? Authorizing Provider  aspirin  EC 81 MG tablet Take 81 mg by mouth daily.   Yes [provider]  calcium carbonate (OS-CAL) 600 MG TABS Take 600 mg by mouth 2 (two) times daily with a meal.   Yes [provider]  estradiol (ESTRACE VAGINAL) 0.1 MG/GM vaginal cream Place 1 Applicatorful vaginally every other day.    Yes [provider]  losartan-hydrochlorothiazide (HYZAAR) 100-25 MG tablet Take 1 tablet by mouth daily. 05/31/19  Yes [provider]  Multiple Vitamin (MULTIVITAMIN) tablet  Take 1 tablet by mouth daily.   Yes [provider]  omeprazole (PRILOSEC) 20 MG capsule Take 20 mg by mouth daily.   Yes [provider]  rosuvastatin (CRESTOR) 5 MG tablet Take 5 mg by mouth every evening.    Yes [provider]  warfarin (COUMADIN) 5 MG tablet TAKE 1 TO 1&1/2 TABLETS BY MOUTH EVERY DAY AS DIRECTED BY COUMADIN CLINIC Patient taking differently: Take 5 mg by mouth daily at 4 PM. 7.5mg  on wed, all other days 5mg  03/26/19  Yes Croitoru, Mihai, MD    Allergies    Codeine, Statins, Sulfa antibiotics, and Verapamil  Review of Systems   Review of Systems  Constitutional: Positive for diaphoresis. Negative for chills and fever.  HENT: Negative for ear pain and sore throat.   Eyes: Negative for pain and visual disturbance.  Respiratory: Negative for cough and shortness of breath.   Cardiovascular: Positive for chest pain. Negative for palpitations.  Gastrointestinal: Negative for abdominal pain, anorexia, nausea and vomiting.  Genitourinary: Negative for dysuria and hematuria.  Musculoskeletal: Negative for arthralgias and back pain.  Skin: Negative for color change and rash.  Neurological: Negative for dizziness, seizures and syncope.  All other systems reviewed and are negative.   Physical Exam Updated Vital Signs  ED Triage Vitals  Enc Vitals Group     BP 07/01/19 1005 (!) 168/80     Pulse Rate 07/01/19 1005 66     Resp 07/01/19 1005 16     Temp 07/01/19 1005 98.4 F (36.9 C)     Temp Source 07/01/19 1005 Oral     SpO2 07/01/19 1005 100 %     Weight --      Height --      Head Circumference --      Peak Flow --      Pain Score 07/01/19 1002 5     Pain Loc --      Pain Edu? --      Excl. in [place]? --     Physical Exam Vitals and nursing note reviewed.  Constitutional:      General: She is not in acute distress.    Appearance: She is well-developed. She is not ill-appearing.  HENT:     Head: Normocephalic and atraumatic.  Eyes:      Conjunctiva/sclera: Conjunctivae normal.     Pupils: Pupils are equal, round, and reactive to light.  Cardiovascular:     Rate and Rhythm: Normal rate and regular rhythm.     Pulses:          Radial pulses are 2+ on the right side and 2+ on the left side.     Heart sounds: Normal heart sounds. No murmur.  Pulmonary:     Effort: Pulmonary effort is normal. No respiratory distress.     Breath sounds: Normal breath sounds. No decreased breath sounds, wheezing, rhonchi or rales.  Abdominal:     Palpations: Abdomen is soft.  Tenderness: There is no abdominal tenderness.  Musculoskeletal:        General: Normal range of motion.     Cervical back: Normal range of motion and neck supple.     Right lower leg: No edema.     Left lower leg: No edema.  Skin:    General: Skin is warm and dry.     Capillary Refill: Capillary refill takes less than 2 seconds.  Neurological:     General: No focal deficit present.     Mental Status: She is alert.     ED Results / Procedures / Treatments   Labs (all labs ordered are listed, but only abnormal results are displayed) Labs Reviewed  BASIC METABOLIC PANEL - Abnormal; Notable for the following components:      Result Value   Glucose, Bld 109 (*)    All other components within normal limits  PROTIME-INR - Abnormal; Notable for the following components:   Prothrombin Time 21.1 (*)    INR 1.9 (*)    All other components within normal limits  CBC  LIPASE, BLOOD  HEPATIC FUNCTION PANEL  TROPONIN I (HIGH SENSITIVITY)  TROPONIN I (HIGH SENSITIVITY)    EKG EKG Interpretation  Date/Time:  Sunday July 01 2019 11:00:08 EDT Ventricular Rate:  56 PR Interval:    QRS Duration: 96 QT Interval:  433 QTC Calculation: 418 R Axis:   -33 Text Interpretation: Sinus rhythm Multiple premature complexes, vent & supraven Left axis deviation Confirmed by Sharon Rose [id number] on 07/01/2019 11:08:46 AM   Radiology DG Chest 2 View  Result Date:  07/01/2019 CLINICAL DATA:  C/o generalized chest "tightness" and SOB that started while in church. Family member noticed pt was sitting down fanning herself. States she felt hot all over. States pain has decreased. Denies nausea and vomiting. EXAM: CHEST - 2 VIEW COMPARISON:  10/13/2016 FINDINGS: Stable changes from prior cardiac surgery. The cardiac silhouette is normal in size and configuration. No mediastinal or hilar masses or evidence of adenopathy. Small hiatal hernia. Lungs are hyperexpanded, but clear. No pleural effusion or pneumothorax. Skeletal structures are intact. IMPRESSION: No acute cardiopulmonary disease. Electronically Signed   By: Lajean Manes M.D.   On: 07/01/2019 10:33    Procedures Procedures (including critical care time)  Medications Ordered in ED Medications  sodium chloride flush (NS) 0.9 % injection 3 mL (3 mLs Intravenous Given 07/01/19 1309)  sodium chloride 0.9 % bolus 500 mL (0 mLs Intravenous Stopped 07/01/19 1340)    ED Course  I have reviewed the triage vital signs and the nursing notes.  Pertinent labs & imaging results that were available during my care of the patient were reviewed by me and considered in my medical decision making (see chart for details).    MDM Rules/Calculators/A&P                      NURIYAH HANLINE is a 79 year old female with history of CAD, aortic valve replacement on Coumadin who presents to the ED with chest pain.  Patient with overall unremarkable vitals.  Had episode of chest pressure, general feeling of being unwell while at church today.  Did not feel any dizziness or presyncopal symptoms.  States that felt different than some of the symptoms that she has had in the past that she thinks was related to gas pain.  Denies any nausea or vomiting.  No abdominal pain.  No PE risk factors.  Patient is on blood thinner.  Patient per person with her possibly had heart rate out of the 40s.  She was diaphoretic.  H&P does not sound consistent  with a presyncopal event.  She has overall vague sounding chest pain but given her cardiac history concern for possible ACS.  Does not appear to have any infectious symptoms.  Chest x-ray showed no signs of pneumonia, pneumothorax, pleural effusion.  No significant anemia, lateral abnormality, kidney injury.  First troponin is normal.  EKG showed no obvious ST changes.  There is artifact but overall appears to be possibly a sinus rhythm.  Will get better EKG and touch base with cardiology.  It has been a while since she had a provocative chest work-up.  Troponin negative x2.  Chest x-ray without any signs of infection or pneumothorax.  No significant anemia, electrolyte abnormality, kidney injury.  Talked with Dr. Marlou Porch with cardiology who reviewed lab work and EKG.  Patient comfortable throughout my care.  At this time low concern for ACS.  Probably some symptomatic PVCs.  We will have her follow-up with her cardiologist this week.  Given return precautions.  Discharged in good condition.  This chart was dictated using voice recognition software.  Despite best efforts to proofread,  errors can occur which can change the documentation meaning.    Final Clinical Impression(s) / ED Diagnoses Final diagnoses:  Atypical chest pain    Rx / DC Orders ED Discharge Orders    None       Sharon Sites, DO 07/01/19 1350

## 2019-07-02 ENCOUNTER — Ambulatory Visit (INDEPENDENT_AMBULATORY_CARE_PROVIDER_SITE_OTHER): Payer: PPO | Admitting: Pharmacist Clinician (PhC)/ Clinical Pharmacy Specialist

## 2019-07-02 DIAGNOSIS — Z7901 Long term (current) use of anticoagulants: Secondary | ICD-10-CM

## 2019-07-02 DIAGNOSIS — Z952 Presence of prosthetic heart valve: Secondary | ICD-10-CM

## 2019-07-02 LAB — POCT INR: INR: 2.3 (ref 2.0–3.0)

## 2019-07-06 ENCOUNTER — Encounter: Payer: Self-pay | Admitting: Cardiovascular Disease

## 2019-07-06 ENCOUNTER — Telehealth (INDEPENDENT_AMBULATORY_CARE_PROVIDER_SITE_OTHER): Payer: PPO | Admitting: Cardiovascular Disease

## 2019-07-06 ENCOUNTER — Telehealth: Payer: Self-pay | Admitting: Radiology

## 2019-07-06 VITALS — BP 116/95 | HR 78 | Ht 66.0 in | Wt 180.8 lb

## 2019-07-06 DIAGNOSIS — E7800 Pure hypercholesterolemia, unspecified: Secondary | ICD-10-CM | POA: Diagnosis not present

## 2019-07-06 DIAGNOSIS — R0602 Shortness of breath: Secondary | ICD-10-CM | POA: Diagnosis not present

## 2019-07-06 DIAGNOSIS — R55 Syncope and collapse: Secondary | ICD-10-CM

## 2019-07-06 DIAGNOSIS — I10 Essential (primary) hypertension: Secondary | ICD-10-CM

## 2019-07-06 DIAGNOSIS — Z952 Presence of prosthetic heart valve: Secondary | ICD-10-CM

## 2019-07-06 NOTE — Progress Notes (Signed)
Virtual Visit via Video Note   This visit type was conducted due to national recommendations for restrictions regarding the COVID-19 Pandemic (e.g. social distancing) in an effort to limit this patient's exposure and mitigate transmission in our community.  Due to her co-morbid illnesses, this patient is at least at moderate risk for complications without adequate follow up.  This format is felt to be most appropriate for this patient at this time.  All issues noted in this document were discussed and addressed.  A limited physical exam was performed with this format.  Please refer to the patient's chart for her consent to telehealth for [facility].   The patient was identified using 2 identifiers.  Date:  07/06/2019   ID:  Sharon Rose, DOB 1940/02/28, MRN [id number]  Patient Location: Home Provider Location: Home  PCP:  Blair Heys, MD  Cardiologist:  Thurmon Fair, MD  Electrophysiologist:  None   Evaluation Performed:  Follow-Up Visit  Chief Complaint:  Near syncope; dyspnea  History of Present Illness:    Sharon Rose is a 79 y.o. female with a mechanical aortic valve prosthesis, hypertension and hyperlipidemia.  Recently she has had episodes of what sounds like near syncope.  They often occur in the morning.  She describes these episodes with some difficulty.  She states that "I have to make myself breathe" and "my son goes behind the clouds".  She feels slightly off balance and may have brief double vision.  The episodes are unpredictable and occur once or twice a week, but sometimes she will go 2 weeks before they occur.  In addition she describes shortness of breath that is particularly noticeable when she tries to sing in the church choir.  This is consistently present.  If she pushes her self she feels a little bit off balance and has to hold on for support and stop singing.  She went to the emergency room for these complaints on June 6.  Her work-up with labs  and chest x-ray was benign (she has a small hiatal hernia), but she had very frequent PACs on her electrocardiogram (the computer interpretation was atrial fibrillation with competing junctional pacemaker due to a lot of baseline artifact).  The overall heart rate was normal.  She tends to be relatively bradycardic typically with heart rates in the mid to high 60s.  Her INR was 1.9 on June 6, rechecked the next day on June 7 and it was 2.3.    She has not had any problems with chest pain at rest or with activity and denies full-blown syncope, falls, injuries or bleeding problems.  She does not have orthopnea, PND or leg edema.  She has not had persistent focal neurological events and denies intermittent claudication.  Echocardiogram on July 29, 2017-normal left ventricular ejection fraction 55-60% and normal diastolic parameters, normal valve function with mean gradient 17 mmHg (unchanged from 2014 and 2017).  Sharon Rose  had replacement of a severely stenotic, probably congenitally bicuspid aortic valve in 2006 with a 23 mm mechanical valve. She has HTN and dyslipidemia that is treated with a statin. Coronary angiography before surgery showed total occlusion of the mid second oblique marginal artery by preoperative coronary angiography in 2006, but no angina and no evidence of ischemia by nuclear stress testing. Repeat coronary angiography in March 2011 did not confirm occlusion of the second oblique marginal branch. She has normal LV function and no history of embolic events. In 2014 she presented with thiazide induced  hypokalemia and bradycardia. Her heart rate normalized after electrolyte correction. We tried to stop her diuretic completely, but this was associated with ankle swelling and difficult to control blood pressure. She has a history of postoperative atrial fibrillation without detected recurrence since then. Normal 30 day event monitor in July 2017.  The patient does not have symptoms  concerning for COVID-19 infection (fever, chills, cough, or new shortness of breath).    Past Medical History:  Diagnosis Date  . CAD (coronary artery disease)    NUCLEAR STRESS TEST, 10/07/2008 - no ischemia  . Dyspnea on exertion   . H/O aortic valve replacement    2D ECHO, 04/26/2011 - EF 50-55%, normal-mild  . Hypertension   . NSTEMI (non-ST elevated myocardial infarction) ([facility]) 12/2004   Secondary to occlusion of OM 2, small vessel and medical therapy recommended; cath 2011 with no CAD   Past Surgical History:  Procedure Laterality Date  . AORTIC VALVE REPLACEMENT  09/12/2004   St. Jude mechanical aortic valve, model #23AK-501, serial [id number]  . CARDIAC CATHETERIZATION Left 03/25/2009   Normal cors  . CARDIAC CATHETERIZATION  01/20/2005   Medical management for occluded and possibly revascularized OM 2, string sign seen  . CARDIAC CATHETERIZATION  08/11/2004   Elective aortic valve replacement, severe aortic valvre stenosis with functional class II to III symptomatology     Current Meds  Medication Sig  . aspirin EC 81 MG tablet Take 81 mg by mouth daily.  . calcium carbonate (OS-CAL) 600 MG TABS Take 600 mg by mouth 2 (two) times daily with a meal.  . estradiol (ESTRACE VAGINAL) 0.1 MG/GM vaginal cream Place 1 Applicatorful vaginally every other day.   . losartan-hydrochlorothiazide (HYZAAR) 100-25 MG tablet Take 1 tablet by mouth daily.  . Multiple Vitamin (MULTIVITAMIN) tablet Take 1 tablet by mouth daily.  Marland Kitchen omeprazole (PRILOSEC) 20 MG capsule Take 20 mg by mouth daily.  . rosuvastatin (CRESTOR) 5 MG tablet Take 5 mg by mouth every evening.   . warfarin (COUMADIN) 5 MG tablet TAKE 1 TO 1&1/2 TABLETS BY MOUTH EVERY DAY AS DIRECTED BY COUMADIN CLINIC (Patient taking differently: Take 5 mg by mouth daily at 4 PM. 7.5mg  on wed, all other days 5mg )     Allergies:   Codeine, Statins, Sulfa antibiotics, and Verapamil   Social History   Tobacco Use  . Smoking status: Never  Smoker  . Smokeless tobacco: Never Used  Substance Use Topics  . Alcohol use: No  . Drug use: No     Family Hx: The patient's family history is not on file.  ROS:   Please see the history of present illness.    All other systems reviewed and are negative.   Prior CV studies:   The following studies were reviewed today: Echo 2019  Labs/Other Tests and Data Reviewed:    EKG:  An ECG dated July 01, 2019 was personally reviewed today and demonstrated:  Sinus rhythm with frequent PACs  Recent Labs: 07/01/2019: ALT 17; BUN 15; Creatinine, Ser 0.85; Hemoglobin 13.8; Platelets 325; Potassium 3.5; Sodium 139   Recent Lipid Panel Lab Results  Component Value Date/Time   CHOL 190 07/08/2014 10:53 AM   TRIG 188 (H) 07/08/2014 10:53 AM   HDL 46 07/08/2014 10:53 AM   CHOLHDL 4.1 07/08/2014 10:53 AM   LDLCALC 106 (H) 07/08/2014 10:53 AM    Wt Readings from Last 3 Encounters:  07/06/19 180 lb 12.8 oz (82 kg)  11/02/18 187 lb 3.2 oz (84.9  kg)  09/27/17 190 lb 6.4 oz (86.4 kg)     Objective:    Vital Signs:  BP (!) 116/95   Pulse 78   Ht 5\' 6"  (1.676 m)   Wt 180 lb 12.8 oz (82 kg)   SpO2 96%   BMI 29.18 kg/m    VITAL SIGNS:  reviewed GEN:  no acute distress EYES:  sclerae anicteric, EOMI - Extraocular Movements Intact RESPIRATORY:  normal respiratory effort, symmetric expansion CARDIOVASCULAR:  no peripheral edema SKIN:  no rash, lesions or ulcers. MUSCULOSKELETAL:  no obvious deformities. NEURO:  alert and oriented x 3, no obvious focal deficit PSYCH:  normal affect    ASSESSMENT & PLAN:    1. Near-syncope: I suspect that the symptoms she is describing are related to frequent ectopic beats.  She has a lot of PACs.  She reports that while in the emergency room and experiencing her typical symptom complex, the emergency room physician reported that her rhythm was irregular and wondered whether she had atrial fibrillation.  I have recommended a 2-week event monitor.  It may  be challenging to treat her arrhythmia.  She has baseline bradycardia and would likely not tolerate beta-blockers or centrally acting calcium channel blockers.  We could consider flecainide, although I am reluctant to do that in an near-octogenarian.  I mentioned the potential need for a pacemaker, but she is very reluctant to consider this. 2. Dyspnea: This time to repeat an echocardiogram to evaluate the function of the mechanical prosthesis and left ventricular systolic function. 3. Mechanical AVR: Her aortic valve gradients have been stable and we have been checking the echo on an every other year basis.  Last evaluation was in 2019. 4. HTN: Unusual blood pressure today.  The pulse pressure is very "tight".  I wonder whether this is another indication of the irregularity of her heartbeat (she also reports that her pulse oximeter at times shows rapidly oscillating heart rates from 36-105). 5. HLP: On statin, labs followed by Dr.Ehinger.  I think the diagnosis of CAD is doubtful and an LDL cholesterol less than 762 is satisfactory, although preferably less than 70.  COVID-19 Education: The signs and symptoms of COVID-19 were discussed with the patient and how to seek care for testing (follow up with PCP or arrange E-visit).  The importance of social distancing was discussed today.  Time:   Today, I have spent 25 minutes with the patient with telehealth technology discussing the above problems.     Medication Adjustments/Labs and Tests Ordered: Current medicines are reviewed at length with the patient today.  Concerns regarding medicines are outlined above.   Tests Ordered: Orders Placed This Encounter  Procedures  . LONG TERM MONITOR (3-14 DAYS)  . ECHOCARDIOGRAM COMPLETE    Medication Changes: No orders of the defined types were placed in this encounter.   Follow Up:  Either In Person or Virtual After the echocardiogram and event monitor has been completed  Signed, Thurmon Fair, MD   07/06/2019 8:49 AM    [facility]

## 2019-07-06 NOTE — Telephone Encounter (Signed)
Enrolled patient for a 14 day Zio monitor to be mailed to patients home.  

## 2019-07-06 NOTE — Patient Instructions (Addendum)
Medication Instructions:  Your Physician recommend you continue on your current medication as directed.    *If you need a refill on your cardiac medications before your next appointment, please call your pharmacy*   Lab Work: None    Testing/Procedures: Your physician has requested that you have an echocardiogram. Echocardiography is a painless test that uses sound waves to create images of your heart. It provides your doctor with information about the size and shape of your heart and how well your hearts chambers and valves are working. This procedure takes approximately one hour. There are no restrictions for this procedure. [place] [address]  Our physician has recommended that you wear an 14  DAY ZIO-PATCH monitor. The Zio patch cardiac monitor continuously records heart rhythm data for up to 14 days, this is for patients being evaluated for multiple types heart rhythms. For the first 24 hours post application, please avoid getting the Zio monitor wet in the shower or by excessive sweating during exercise. After that, feel free to carry on with regular activities. Keep soaps and lotions away from the ZIO XT Patch.   Someone from our office will call to mail monitor       Follow-Up: At [facility] ([place]), you and your health needs are our priority.  As part of our continuing mission to provide you with exceptional heart care, we have created designated Provider Care Teams.  These Care Teams include your primary Cardiologist (physician) and Advanced Practice Providers (APPs -  Physician Assistants and Nurse Practitioners) who all work together to provide you with the care you need, when you need it.  We recommend signing up for the patient portal called "MyChart".  Sign up information is provided on this After Visit Summary.  MyChart is used to connect with patients for Virtual Visits (Telemedicine).  Patients are able to view lab/test results, encounter notes, upcoming  appointments, etc.  Non-urgent messages can be sent to your provider as well.   To learn more about what you can do with MyChart, go to [email or web address].    Your next appointment:   6-8 weeks  The format for your next appointment:   Virtual Visit   Provider:   Sanda Klein, MD   [place] Monitor Instructions   Your physician has requested you wear your ZIO patch monitor__14_____days.   This is a single patch monitor.  [employer] supplies one patch monitor per enrollment.  Additional stickers are not available.   Please do not apply patch if you will be having a Nuclear Stress Test, Echocardiogram, Cardiac CT, MRI, or Chest Xray during the time frame you would be wearing the monitor. The patch cannot be worn during these tests.  You cannot remove and re-apply the ZIO XT patch monitor.   Your ZIO patch monitor will be sent USPS Priority mail from [facility] directly to your home address. The monitor may also be mailed to a PO BOX if home delivery is not available.   It may take 3-5 days to receive your monitor after you have been enrolled.   Once you have received you monitor, please review enclosed instructions.  Your monitor has already been registered assigning a specific monitor serial # to you.   Applying the monitor   Shave hair from upper left chest.   Hold abrader disc by orange tab.  Rub abrader in 40 strokes over left upper chest as indicated in your monitor instructions.   Clean area with 4 enclosed  alcohol pads .  Use all pads to assure are is cleaned thoroughly.  Let dry.   Apply patch as indicated in monitor instructions.  Patch will be place under collarbone on left side of chest with arrow pointing upward.   Rub patch adhesive wings for 2 minutes.Remove white label marked "1".  Remove white label marked "2".  Rub patch adhesive wings for 2 additional minutes.   While looking in a mirror, press and release button in center of patch.  A small  green light will flash 3-4 times .  This will be your only indicator the monitor has been turned on.     Do not shower for the first 24 hours.  You may shower after the first 24 hours.   Press button if you feel a symptom. You will hear a small click.  Record Date, Time and Symptom in the Patient Log Book.   When you are ready to remove patch, follow instructions on last 2 pages of Patient Log Book.  Stick patch monitor onto last page of Patient Log Book.   Place Patient Log Book in [place] box.  Use locking tab on box and tape box closed securely.  The [employer] and [employer] has [employer] on it.  Please place in mailbox as soon as possible.  Your physician should have your test results approximately 7 days after the monitor has been mailed back to [facility].   Call [place] at [phone] if you have questions regarding your ZIO XT patch monitor.  Call them immediately if you see an orange light blinking on your monitor.   If your monitor falls off in less than 4 days contact our Monitor department at [phone].  If your monitor becomes loose or falls off after 4 days call [employer] at [phone] for suggestions on securing your monitor.

## 2019-07-11 ENCOUNTER — Ambulatory Visit (INDEPENDENT_AMBULATORY_CARE_PROVIDER_SITE_OTHER): Payer: PPO

## 2019-07-11 DIAGNOSIS — R55 Syncope and collapse: Secondary | ICD-10-CM | POA: Diagnosis not present

## 2019-07-13 ENCOUNTER — Ambulatory Visit: Payer: Self-pay | Admitting: Podiatry

## 2019-07-23 ENCOUNTER — Ambulatory Visit (HOSPITAL_COMMUNITY): Payer: PPO | Attending: Cardiovascular Disease

## 2019-07-23 ENCOUNTER — Other Ambulatory Visit: Payer: Self-pay

## 2019-07-23 DIAGNOSIS — R0602 Shortness of breath: Secondary | ICD-10-CM | POA: Insufficient documentation

## 2019-07-23 DIAGNOSIS — Z952 Presence of prosthetic heart valve: Secondary | ICD-10-CM | POA: Diagnosis not present

## 2019-07-24 ENCOUNTER — Ambulatory Visit: Payer: PPO | Admitting: Podiatry

## 2019-07-24 ENCOUNTER — Other Ambulatory Visit: Payer: Self-pay

## 2019-07-24 ENCOUNTER — Encounter: Payer: Self-pay | Admitting: Podiatry

## 2019-07-24 ENCOUNTER — Ambulatory Visit (INDEPENDENT_AMBULATORY_CARE_PROVIDER_SITE_OTHER): Payer: PPO

## 2019-07-24 DIAGNOSIS — M778 Other enthesopathies, not elsewhere classified: Secondary | ICD-10-CM

## 2019-07-24 DIAGNOSIS — M19072 Primary osteoarthritis, left ankle and foot: Secondary | ICD-10-CM | POA: Diagnosis not present

## 2019-07-24 DIAGNOSIS — M109 Gout, unspecified: Secondary | ICD-10-CM | POA: Diagnosis not present

## 2019-07-25 NOTE — Progress Notes (Signed)
Subjective:  Patient ID: Sharon Rose, female    DOB: Jun 09, 1940,  MRN: [id number] HPI Chief Complaint  Patient presents with  . Foot Pain    Dorsal forefoot/midfoot and anterior ankle left - aching x several months, thought had gout flare few weeks ago (anterior ankle was red and swollen), no treatment, but did cut out red meats from diet  . New Patient (Initial Visit)    79 y.o. female presents with the above complaint.   ROS: Denies fever chills nausea vomiting muscle aches pains calf pain back pain chest pain shortness of breath.  Past Medical History:  Diagnosis Date  . CAD (coronary artery disease)    NUCLEAR STRESS TEST, 10/07/2008 - no ischemia  . Dyspnea on exertion   . H/O aortic valve replacement    2D ECHO, 04/26/2011 - EF 50-55%, normal-mild  . Hypertension   . NSTEMI (non-ST elevated myocardial infarction) (HCC) 12/2004   Secondary to occlusion of OM 2, small vessel and medical therapy recommended; cath 2011 with no CAD   Past Surgical History:  Procedure Laterality Date  . AORTIC VALVE REPLACEMENT  09/12/2004   St. Jude mechanical aortic valve, model #23AK-501, serial [id number]  . CARDIAC CATHETERIZATION Left 03/25/2009   Normal cors  . CARDIAC CATHETERIZATION  01/20/2005   Medical management for occluded and possibly revascularized OM 2, string sign seen  . CARDIAC CATHETERIZATION  08/11/2004   Elective aortic valve replacement, severe aortic valvre stenosis with functional class II to III symptomatology    Current Outpatient Medications:  .  aspirin EC 81 MG tablet, Take 81 mg by mouth daily., Disp: , Rfl:  .  calcium carbonate (OS-CAL) 600 MG TABS, Take 600 mg by mouth 2 (two) times daily with a meal., Disp: , Rfl:  .  estradiol (ESTRACE VAGINAL) 0.1 MG/GM vaginal cream, Place 1 Applicatorful vaginally every other day. , Disp: , Rfl:  .  losartan-hydrochlorothiazide (HYZAAR) 100-25 MG tablet, Take 1 tablet by mouth daily., Disp: , Rfl:  .  Multiple Vitamin  (MULTIVITAMIN) tablet, Take 1 tablet by mouth daily., Disp: , Rfl:  .  omeprazole (PRILOSEC) 20 MG capsule, Take 20 mg by mouth daily., Disp: , Rfl:  .  rosuvastatin (CRESTOR) 5 MG tablet, Take 5 mg by mouth every evening. , Disp: , Rfl:  .  warfarin (COUMADIN) 5 MG tablet, TAKE 1 TO 1&1/2 TABLETS BY MOUTH EVERY DAY AS DIRECTED BY COUMADIN CLINIC (Patient taking differently: Take 5 mg by mouth daily at 4 PM. 7.5mg  on wed, all other days 5mg ), Disp: 130 tablet, Rfl: 1  Allergies  Allergen Reactions  . Codeine Nausea And Vomiting and Other (See Comments)    GI upset, dizziness  . Statins Other (See Comments)    Joint and muscle pain  . Sulfa Antibiotics Nausea And Vomiting and Other (See Comments)    Gi upset, dizziness   . Verapamil Other (See Comments)    Constipation   Review of Systems Objective:  There were no vitals filed for this visit.  General: Well developed, nourished, in no acute distress, alert and oriented x3   Dermatological: Skin is warm, dry and supple bilateral. Nails x 10 are well maintained; remaining integument appears unremarkable at this time. There are no open sores, no preulcerative lesions, no rash or signs of infection present.  Vascular: Dorsalis Pedis artery and Posterior Tibial artery pedal pulses are 2/4 bilateral with immedate capillary fill time. Pedal hair growth present. No varicosities and no lower extremity edema  present bilateral.   Neruologic: Grossly intact via light touch bilateral. Vibratory intact via tuning fork bilateral. Protective threshold with Semmes Wienstein monofilament intact to all pedal sites bilateral. Patellar and Achilles deep tendon reflexes 2+ bilateral. No Babinski or clonus noted bilateral.   Musculoskeletal: No gross boney pedal deformities bilateral. No pain, crepitus, or limitation noted with foot and ankle range of motion bilateral. Muscular strength 5/5 in all groups tested bilateral.  She has pain on palpation TM joints  and lesser metatarsophalangeal joint area as well as the deep peroneal nerve and the first metatarsophalangeal joint area particularly left.  Gait: Unassisted, Nonantalgic.    Radiographs:  Radiographs taken today demonstrate considerable osteoarthritic change across the dorsum of the foot.  Assessment & Plan:   Assessment: Osteoarthritis and capsulitis probable gouty arthritis based on her history  Plan: Offered her injection in medications today she declined at this point we did discuss Voltaren gel instead of oral medication.  I expressed to her the next time she gets a flare she needs to come in immediately and request blood work consisting of a CBC and an arthritic panel.     Greenley Martone T. [place], [place]

## 2019-08-10 DIAGNOSIS — R55 Syncope and collapse: Secondary | ICD-10-CM | POA: Diagnosis not present

## 2019-08-13 ENCOUNTER — Other Ambulatory Visit: Payer: Self-pay

## 2019-08-13 ENCOUNTER — Ambulatory Visit (INDEPENDENT_AMBULATORY_CARE_PROVIDER_SITE_OTHER): Payer: PPO | Admitting: Pharmacist Clinician (PhC)/ Clinical Pharmacy Specialist

## 2019-08-13 DIAGNOSIS — Z952 Presence of prosthetic heart valve: Secondary | ICD-10-CM | POA: Diagnosis not present

## 2019-08-13 DIAGNOSIS — Z7901 Long term (current) use of anticoagulants: Secondary | ICD-10-CM | POA: Diagnosis not present

## 2019-08-13 DIAGNOSIS — C44519 Basal cell carcinoma of skin of other part of trunk: Secondary | ICD-10-CM | POA: Diagnosis not present

## 2019-08-13 LAB — POCT INR: INR: 2.8 (ref 2.0–3.0)

## 2019-08-17 ENCOUNTER — Telehealth (INDEPENDENT_AMBULATORY_CARE_PROVIDER_SITE_OTHER): Payer: PPO | Admitting: Cardiovascular Disease

## 2019-08-17 ENCOUNTER — Encounter: Payer: Self-pay | Admitting: Cardiovascular Disease

## 2019-08-17 VITALS — BP 138/89 | HR 101 | Wt 181.8 lb

## 2019-08-17 DIAGNOSIS — I471 Supraventricular tachycardia: Secondary | ICD-10-CM

## 2019-08-17 DIAGNOSIS — R0602 Shortness of breath: Secondary | ICD-10-CM

## 2019-08-17 DIAGNOSIS — I10 Essential (primary) hypertension: Secondary | ICD-10-CM | POA: Diagnosis not present

## 2019-08-17 DIAGNOSIS — E7800 Pure hypercholesterolemia, unspecified: Secondary | ICD-10-CM

## 2019-08-17 DIAGNOSIS — Z952 Presence of prosthetic heart valve: Secondary | ICD-10-CM | POA: Diagnosis not present

## 2019-08-17 MED ORDER — METOPROLOL TARTRATE 25 MG PO TABS
12.5000 mg | ORAL_TABLET | Freq: Two times a day (BID) | ORAL | 1 refills | Status: DC
Start: 2019-08-17 — End: 2020-01-23

## 2019-08-17 NOTE — Patient Instructions (Signed)
Medication Instructions:  Start Metoprolol Tartrate 12.5 mg (1/2) Tablet Twice a day *If you need a refill on your cardiac medications before your next appointment, please call your pharmacy*   Lab Work: None ordered If you have labs (blood work) drawn today and your tests are completely normal, you will receive your results only by: [place] MyChart Message (if you have MyChart) OR . A paper copy in the mail If you have any lab test that is abnormal or we need to change your treatment, we will call you to review the results.   Testing/Procedures: None ordered   Follow-Up: At [facility], you and your health needs are our priority.  As part of our continuing mission to provide you with exceptional heart care, we have created designated Provider Care Teams.  These Care Teams include your primary Cardiologist (physician) and Advanced Practice Providers (APPs -  Physician Assistants and Nurse Practitioners) who all work together to provide you with the care you need, when you need it.  We recommend signing up for the patient portal called "MyChart".  Sign up information is provided on this After Visit Summary.  MyChart is used to connect with patients for Virtual Visits (Telemedicine).  Patients are able to view lab/test results, encounter notes, upcoming appointments, etc.  Non-urgent messages can be sent to your provider as well.   To learn more about what you can do with MyChart, go to [email or web address].    Your next appointment:   3 month(s)  The format for your next appointment:   In Person  Provider:   You may see Sanda Klein, MD or one of the following Advanced Practice Providers on your designated Care Team:    Almyra Deforest, PA-C  Fabian Sharp, [place] or   Roby Lofts, [place]    Other Instructions

## 2019-08-17 NOTE — Progress Notes (Signed)
Virtual Visit via Video Note   This visit type was conducted due to national recommendations for restrictions regarding the COVID-19 Pandemic (e.g. social distancing) in an effort to limit this patient's exposure and mitigate transmission in our community.  Due to her co-morbid illnesses, this patient is at least at moderate risk for complications without adequate follow up.  This format is felt to be most appropriate for this patient at this time.  All issues noted in this document were discussed and addressed.  A limited physical exam was performed with this format.  Please refer to the patient's chart for her consent to telehealth for [facility].   The patient was identified using 2 identifiers.  Date:  08/17/2019   ID:  Sharon Rose, DOB 1940/10/13, MRN [id number]  Patient Location: Home Provider Location: Office/Clinic  PCP:  Blair Heys, MD  Cardiologist:  Thurmon Fair, MD  Electrophysiologist:  None   Evaluation Performed:  Follow-Up Visit  Chief Complaint:  dizziness; dyspnea  History of Present Illness:    Sharon Rose is a 79 y.o. female with a mechanical aortic valve prosthesis, hypertension and hyperlipidemia.  She is generally done well since her last appointment.  She occasionally feels dizziness and palpitations.  On one occasion a couple of weeks ago it was a little more severe she felt that everything "went haywire" and had transient vertical diplopia.  Dyspnea is not severe and is largely unchanged.  Her echocardiogram showed normal left ventricular systolic function and normal function of her aortic valve prosthesis.  An extended arrhythmia monitor showed very frequent episodes of brief paroxysmal atrial tachycardia, but no atrial fibrillation, no meaningful bradycardia and no ventricular arrhythmia.  In the past she has had issues of bradycardia with beta-blockers.  Occasionally her blood pressure will show unusual readings such as 130/100, probably  erroneous due to the arrhythmia.  Most of the time her blood pressure is in the low 130s/70s.  She frequently will see heart rates in the 100s, probably periods of atrial tachycardia.  Otherwise her heart rate will usually be in the 70s. She has not had angina at rest or with activity, full-blown syncope, lower extremity edema, falls (other than an accidental fall out of bed), serious injuries or bleeding problems.  She denies orthopnea, PND and peripheral edema.  Other than the episode of dizziness and diplopia that she describes a couple weeks ago she has not had any focal neurological events.  Echocardiogram on July 29, 2017-normal left ventricular ejection fraction 55-60% and normal diastolic parameters, normal valve function with mean gradient 17 mmHg (unchanged from 2014 and 2017).  Echocardiogram 07/23/2019 shows LVEF 50-55%, normal aortic valve prosthesis parameters with mean gradient 12 mmHg and peak velocity 2.56 m/s.  [name]  had replacement of a severely stenotic, probably congenitally bicuspid aortic valve in 2006 with a 23 mm mechanical valve. She has HTN and dyslipidemia that is treated with a statin. Coronary angiography before surgery showed total occlusion of the mid second oblique marginal artery by preoperative coronary angiography in 2006, but no angina and no evidence of ischemia by nuclear stress testing. Repeat coronary angiography in March 2011 did not confirm occlusion of the second oblique marginal branch. She has normal LV function and no history of embolic events. In 2014 she presented with thiazide induced hypokalemia and bradycardia. Her heart rate normalized after electrolyte correction. We tried to stop her diuretic completely, but this was associated with ankle swelling and difficult to control blood pressure.  She has a history of postoperative atrial fibrillation without detected recurrence since then. Normal 30 day event monitor in July 2017.   Past Medical History:   Diagnosis Date  . CAD (coronary artery disease)    NUCLEAR STRESS TEST, 10/07/2008 - no ischemia  . Dyspnea on exertion   . H/O aortic valve replacement    2D ECHO, 04/26/2011 - EF 50-55%, normal-mild  . Hypertension   . NSTEMI (non-ST elevated myocardial infarction) (HCC) 12/2004   Secondary to occlusion of OM 2, small vessel and medical therapy recommended; cath 2011 with no CAD   Past Surgical History:  Procedure Laterality Date  . AORTIC VALVE REPLACEMENT  09/12/2004   St. Jude mechanical aortic valve, model #23AK-501, serial [id number]  . CARDIAC CATHETERIZATION Left 03/25/2009   Normal cors  . CARDIAC CATHETERIZATION  01/20/2005   Medical management for occluded and possibly revascularized OM 2, string sign seen  . CARDIAC CATHETERIZATION  08/11/2004   Elective aortic valve replacement, severe aortic valvre stenosis with functional class II to III symptomatology     Current Meds  Medication Sig  . aspirin EC 81 MG tablet Take 81 mg by mouth daily.  . calcium carbonate (OS-CAL) 600 MG TABS Take 600 mg by mouth 2 (two) times daily with a meal.  . estradiol (ESTRACE VAGINAL) 0.1 MG/GM vaginal cream Place 1 Applicatorful vaginally every other day.   . losartan-hydrochlorothiazide (HYZAAR) 100-25 MG tablet Take 1 tablet by mouth daily.  . Multiple Vitamin (MULTIVITAMIN) tablet Take 1 tablet by mouth daily.  Marland Kitchen omeprazole (PRILOSEC) 20 MG capsule Take 20 mg by mouth daily.  . rosuvastatin (CRESTOR) 5 MG tablet Take 5 mg by mouth every evening.   . warfarin (COUMADIN) 5 MG tablet TAKE 1 TO 1&1/2 TABLETS BY MOUTH EVERY DAY AS DIRECTED BY COUMADIN CLINIC (Patient taking differently: Take 5 mg by mouth daily at 4 PM. 7.5mg  on wed, all other days 5mg )     Allergies:   Codeine, Statins, Sulfa antibiotics, and Verapamil   Social History   Tobacco Use  . Smoking status: Never Smoker  . Smokeless tobacco: Never Used  Substance Use Topics  . Alcohol use: No  . Drug use: No     Family  Hx: The patient's family history is not on file.  ROS:   Please see the history of present illness.   All other systems are reviewed and are negative.  Prior CV studies:   The following studies were reviewed today: Echo 07/23/2019  Labs/Other Tests and Data Reviewed:    EKG:  An ECG dated July 01, 2019 was personally reviewed today and demonstrated:  Sinus rhythm with frequent PACs  Recent Labs: 07/01/2019: ALT 17; BUN 15; Creatinine, Ser 0.85; Hemoglobin 13.8; Platelets 325; Potassium 3.5; Sodium 139   Recent Lipid Panel Lab Results  Component Value Date/Time   CHOL 190 07/08/2014 10:53 AM   TRIG 188 (H) 07/08/2014 10:53 AM   HDL 46 07/08/2014 10:53 AM   CHOLHDL 4.1 07/08/2014 10:53 AM   LDLCALC 106 (H) 07/08/2014 10:53 AM    Wt Readings from Last 3 Encounters:  08/17/19 181 lb 12.8 oz (82.5 kg)  07/06/19 180 lb 12.8 oz (82 kg)  11/02/18 187 lb 3.2 oz (84.9 kg)     Objective:    Vital Signs:  BP (!) 138/89   Pulse 101   Wt 181 lb 12.8 oz (82.5 kg)   BMI 29.34 kg/m    VITAL SIGNS:  reviewed GEN:  no  acute distress EYES:  sclerae anicteric, EOMI - Extraocular Movements Intact RESPIRATORY:  normal respiratory effort, symmetric expansion CARDIOVASCULAR:  no peripheral edema SKIN:  no rash, lesions or ulcers. MUSCULOSKELETAL:  no obvious deformities. NEURO:  alert and oriented x 3, no obvious focal deficit PSYCH:  normal affect    ASSESSMENT & PLAN:    1. PAT (paroxysmal atrial tachycardia) (HCC)   2. Shortness of breath   3. H/O aortic valve replacement   4. Essential hypertension   5. Hypercholesterolemia      1. PAT: We will start very low-dose beta-blocker.  History of bradycardia with beta-blockers in the past.  Will try to avoid using true antiarrhythmics and avoid the need for pacemaker implantation. 2. Dyspnea: Normal left ventricular systolic function and normal aortic valve prosthetic gradients. 3. Mechanical AVR: Her aortic valve gradients have been  stable and we have been checking the echo on an every other year basis.   4. HTN: Fair control.  Occasionally she gets readings that are unusual: The pulse pressure is very "tight".  I wonder whether this is another indication of the irregularity of her heartbeat (she also reports that her pulse oximeter at times shows rapidly oscillating heart rates from 36-105). 5. HLP: On statin, labs followed by Dr.Ehinger.  I think the diagnosis of CAD is doubtful and an LDL cholesterol less than 161 is satisfactory, although preferably less than 70.  COVID-19 Education: The signs and symptoms of COVID-19 were discussed with the patient and how to seek care for testing (follow up with PCP or arrange E-visit).  The importance of social distancing was discussed today.  Time:   Today, I have spent 25 minutes with the patient with telehealth technology discussing the above problems.     Medication Adjustments/Labs and Tests Ordered: Current medicines are reviewed at length with the patient today.  Concerns regarding medicines are outlined above.   Tests Ordered: No orders of the defined types were placed in this encounter.   Medication Changes: Meds ordered this encounter  Medications  . metoprolol tartrate (LOPRESSOR) 25 MG tablet    Sig: Take 0.5 tablets (12.5 mg total) by mouth 2 (two) times daily.    Dispense:  90 tablet    Refill:  1    Follow Up:  Either In Person or Virtual After the echocardiogram and event monitor has been completed  Signed, Thurmon Fair, MD  08/17/2019 9:08 AM    [facility]

## 2019-08-29 ENCOUNTER — Ambulatory Visit
Admission: RE | Admit: 2019-08-29 | Discharge: 2019-08-29 | Disposition: A | Discharge status: Home / Self Care | Payer: PPO | Source: Ambulatory Visit | Attending: Family Medicine | Admitting: Family Medicine

## 2019-08-29 ENCOUNTER — Other Ambulatory Visit: Payer: Self-pay | Admitting: Family Medicine

## 2019-08-29 DIAGNOSIS — W06XXXA Fall from bed, initial encounter: Secondary | ICD-10-CM

## 2019-08-29 DIAGNOSIS — M549 Dorsalgia, unspecified: Secondary | ICD-10-CM

## 2019-08-29 DIAGNOSIS — M47812 Spondylosis without myelopathy or radiculopathy, cervical region: Secondary | ICD-10-CM | POA: Diagnosis not present

## 2019-08-29 DIAGNOSIS — I700 Atherosclerosis of aorta: Secondary | ICD-10-CM | POA: Diagnosis not present

## 2019-09-09 ENCOUNTER — Other Ambulatory Visit: Payer: Self-pay | Admitting: Cardiovascular Disease

## 2019-09-14 DIAGNOSIS — Z1231 Encounter for screening mammogram for malignant neoplasm of breast: Secondary | ICD-10-CM | POA: Diagnosis not present

## 2019-09-14 DIAGNOSIS — Z780 Asymptomatic menopausal state: Secondary | ICD-10-CM | POA: Diagnosis not present

## 2019-09-14 DIAGNOSIS — M85852 Other specified disorders of bone density and structure, left thigh: Secondary | ICD-10-CM | POA: Diagnosis not present

## 2019-09-24 ENCOUNTER — Other Ambulatory Visit: Payer: Self-pay

## 2019-09-24 ENCOUNTER — Ambulatory Visit (INDEPENDENT_AMBULATORY_CARE_PROVIDER_SITE_OTHER): Payer: PPO

## 2019-09-24 DIAGNOSIS — Z7901 Long term (current) use of anticoagulants: Secondary | ICD-10-CM

## 2019-09-24 DIAGNOSIS — Z952 Presence of prosthetic heart valve: Secondary | ICD-10-CM

## 2019-09-24 LAB — POCT INR: INR: 3.6 — AB (ref 2.0–3.0)

## 2019-09-24 NOTE — Patient Instructions (Signed)
Hold today and then Continue same dose 1 tablet daily except 1.5 tablets each Wednesday.  Repeat INR in 4 weeks.

## 2019-10-22 ENCOUNTER — Other Ambulatory Visit: Payer: Self-pay

## 2019-10-22 ENCOUNTER — Ambulatory Visit (INDEPENDENT_AMBULATORY_CARE_PROVIDER_SITE_OTHER): Payer: PPO

## 2019-10-22 DIAGNOSIS — Z7901 Long term (current) use of anticoagulants: Secondary | ICD-10-CM

## 2019-10-22 DIAGNOSIS — Z952 Presence of prosthetic heart valve: Secondary | ICD-10-CM | POA: Diagnosis not present

## 2019-10-22 LAB — POCT INR: INR: 2.5 (ref 2.0–3.0)

## 2019-10-22 NOTE — Patient Instructions (Signed)
Continue same dose 1 tablet daily except 1.5 tablets each Wednesday.  Repeat INR in 6 weeks.

## 2019-11-13 DIAGNOSIS — L570 Actinic keratosis: Secondary | ICD-10-CM | POA: Diagnosis not present

## 2019-11-13 DIAGNOSIS — Z85828 Personal history of other malignant neoplasm of skin: Secondary | ICD-10-CM | POA: Diagnosis not present

## 2019-11-13 DIAGNOSIS — L905 Scar conditions and fibrosis of skin: Secondary | ICD-10-CM | POA: Diagnosis not present

## 2019-11-13 DIAGNOSIS — L821 Other seborrheic keratosis: Secondary | ICD-10-CM | POA: Diagnosis not present

## 2019-12-05 ENCOUNTER — Ambulatory Visit: Payer: PPO | Admitting: Cardiovascular Disease

## 2019-12-05 ENCOUNTER — Encounter: Payer: Self-pay | Admitting: Cardiovascular Disease

## 2019-12-05 ENCOUNTER — Other Ambulatory Visit: Payer: Self-pay

## 2019-12-05 ENCOUNTER — Ambulatory Visit (INDEPENDENT_AMBULATORY_CARE_PROVIDER_SITE_OTHER): Payer: PPO

## 2019-12-05 VITALS — BP 138/76 | HR 56 | Ht 66.0 in | Wt 181.0 lb

## 2019-12-05 DIAGNOSIS — Z7901 Long term (current) use of anticoagulants: Secondary | ICD-10-CM | POA: Diagnosis not present

## 2019-12-05 DIAGNOSIS — Z952 Presence of prosthetic heart valve: Secondary | ICD-10-CM

## 2019-12-05 DIAGNOSIS — I471 Supraventricular tachycardia: Secondary | ICD-10-CM

## 2019-12-05 DIAGNOSIS — I10 Essential (primary) hypertension: Secondary | ICD-10-CM

## 2019-12-05 DIAGNOSIS — E7800 Pure hypercholesterolemia, unspecified: Secondary | ICD-10-CM | POA: Diagnosis not present

## 2019-12-05 DIAGNOSIS — R55 Syncope and collapse: Secondary | ICD-10-CM

## 2019-12-05 LAB — POCT INR: INR: 2.7 (ref 2.0–3.0)

## 2019-12-05 MED ORDER — LOSARTAN POTASSIUM 100 MG PO TABS
100.0000 mg | ORAL_TABLET | Freq: Every day | ORAL | 3 refills | Status: DC
Start: 2019-12-05 — End: 2020-11-20

## 2019-12-05 NOTE — Patient Instructions (Signed)
Continue same dose 1 tablet daily except 1.5 tablets each Wednesday.  Repeat INR in 6 weeks.

## 2019-12-05 NOTE — Patient Instructions (Addendum)
Medication Instructions:  STOP the Losartan-Hydrochlorothiazide START Losartan 100 mg once daily  *If you need a refill on your cardiac medications before your next appointment, please call your pharmacy*   Lab Work: None ordered If you have labs (blood work) drawn today and your tests are completely normal, you will receive your results only by:  [place] (if you have MyChart) OR  A paper copy in the mail If you have any lab test that is abnormal or we need to change your treatment, we will call you to review the results.   Testing/Procedures: None ordered   Follow-Up: At [facility], you and your health needs are our priority.  As part of our continuing mission to provide you with exceptional heart care, we have created designated Provider Care Teams.  These Care Teams include your primary Cardiologist (physician) and Advanced Practice Providers (APPs -  Physician Assistants and Nurse Practitioners) who all work together to provide you with the care you need, when you need it.  We recommend signing up for the patient portal called "MyChart".  Sign up information is provided on this After Visit Summary.  MyChart is used to connect with patients for Virtual Visits (Telemedicine).  Patients are able to view lab/test results, encounter notes, upcoming appointments, etc.  Non-urgent messages can be sent to your provider as well.   To learn more about what you can do with MyChart, go to [email or web address].    Your next appointment:   3 month(s)  The format for your next appointment:   In Person  Provider:   You may see Sanda Klein, MD or one of the following Advanced Practice Providers on your designated Care Team:    Almyra Deforest, PA-C  Fabian Sharp, [place] or   Roby Lofts, [place]   Other Instructions Dr. Sallyanne Kuster would like you to check your blood pressure daily for the next 2 weeks.  Keep a journal of these daily blood pressure and heart rate readings and call  our office or send a message through [place] with the results. Thank you!

## 2019-12-05 NOTE — Progress Notes (Signed)
Cardiology office Note    Date:  12/08/2019   ID:  Sharon Rose, DOB 29-Dec-1940, MRN [id number]  PCP:  Blair Heys, MD  Cardiologist:  Thurmon Fair, MD  Electrophysiologist:  None   Evaluation Performed:  Follow-Up Visit  Chief Complaint: Near syncope History of Present Illness:    Sharon Rose is a 79 y.o. female with a mechanical aortic valve prosthesis, hypertension and hyperlipidemia.  A few weekends ago she had a near syncopal event where she had to lay down in the church used to avoid passing out. She had been sitting down when the symptoms began. There was no obvious precipitant. There was no associated shortness of breath, angina, palpitations, diaphoresis or nausea. She denies focal neurological complaints, diplopia, visual changes or full-blown syncope.  Her echocardiogram 07/23/2019 showed normal left ventricular systolic function and normal function of her aortic valve prosthesis. A 30-day extended arrhythmia monitor in June-July showed the same previously detected episodes of brief paroxysmal atrial tachycardia, but no atrial fibrillation, no meaningful bradycardia and no ventricular arrhythmia.  In the past she has had issues of bradycardia with beta-blockers.  Sharon Rose  had replacement of a severely stenotic, probably congenitally bicuspid aortic valve in 2006 with a 23 mm mechanical valve. She has HTN and dyslipidemia that is treated with a statin. Coronary angiography before surgery showed total occlusion of the mid second oblique marginal artery by preoperative coronary angiography in 2006, but no angina and no evidence of ischemia by nuclear stress testing. Repeat coronary angiography in March 2011 did not confirm occlusion of the second oblique marginal branch. She has normal LV function and no history of embolic events. In 2014 she presented with thiazide induced hypokalemia and bradycardia. Her heart rate normalized after electrolyte correction. We tried to  stop her diuretic completely, but this was associated with ankle swelling and difficult to control blood pressure. She has a history of postoperative atrial fibrillation without detected recurrence since then. Normal 30 day event monitor in July 2017.   Past Medical History:  Diagnosis Date  . CAD (coronary artery disease)    NUCLEAR STRESS TEST, 10/07/2008 - no ischemia  . Dyspnea on exertion   . H/O aortic valve replacement    2D ECHO, 04/26/2011 - EF 50-55%, normal-mild  . Hypertension   . NSTEMI (non-ST elevated myocardial infarction) (HCC) 12/2004   Secondary to occlusion of OM 2, small vessel and medical therapy recommended; cath 2011 with no CAD   Past Surgical History:  Procedure Laterality Date  . AORTIC VALVE REPLACEMENT  09/12/2004   St[employer] Jude mechanical aortic valve, model #23AK-501, serial [id number]  . CARDIAC CATHETERIZATION Left 03/25/2009   Normal cors  . CARDIAC CATHETERIZATION  01/20/2005   Medical management for occluded and possibly revascularized OM 2, string sign seen  . CARDIAC CATHETERIZATION  08/11/2004   Elective aortic valve replacement, severe aortic valvre stenosis with functional class II to III symptomatology     Current Meds  Medication Sig  . aspirin EC 81 MG tablet Take 81 mg by mouth daily.  . calcium carbonate (OS-CAL) 600 MG TABS Take 600 mg by mouth 2 (two) times daily with a meal.  . estradiol (ESTRACE VAGINAL) 0.1 MG/GM vaginal cream Place 1 Applicatorful vaginally every other day.   . metoprolol tartrate (LOPRESSOR) 25 MG tablet Take 0.5 tablets (12.5 mg total) by mouth 2 (two) times daily.  . Multiple Vitamin (MULTIVITAMIN) tablet Take 1 tablet by mouth daily.  Marland Kitchen omeprazole (PRILOSEC) 20  MG capsule Take 20 mg by mouth daily.  . rosuvastatin (CRESTOR) 5 MG tablet Take 5 mg by mouth every evening.   . warfarin (COUMADIN) 5 MG tablet TAKE 1 TO 1&1/2 TABLETS BY MOUTH EVERY DAY AS DIRECTED BY COUMADIN CLINIC  . [DISCONTINUED]  losartan-hydrochlorothiazide (HYZAAR) 100-25 MG tablet Take 1 tablet by mouth daily.     Allergies:   Codeine, Statins, Sulfa antibiotics, and Verapamil   Social History   Tobacco Use  . Smoking status: Never Smoker  . Smokeless tobacco: Never Used  Substance Use Topics  . Alcohol use: No  . Drug use: No     Family Hx: The patient's family history is not on file.  ROS:   Please see the history of present illness.   All other systems are reviewed and are negative.  Prior CV studies:   The following studies were reviewed today: Echo 07/23/2019  Labs/Other Tests and Data Reviewed:    ECHO 07/23/2019:  1. Left ventricular ejection fraction, by estimation, is 50 to 55%. The left ventricle has low normal function. The left ventricle has no regional wall motion abnormalities. There is mild  concentric left ventricular hypertrophy. Left ventricular diastolic parameters are consistent with Grade I diastolic dysfunction (impaired relaxation). 2. Right ventricular systolic function is low normal. The right ventricular size is normal. There is normal pulmonary artery systolic pressure. The estimated right ventricular systolic pressure is 23.1 mmHg. 3. The mitral valve is grossly normal. Trivial mitral valve regurgitation. No evidence of mitral stenosis. 4. 23 mm mechanical AoV. V max 2.56 m/s, MG 12 mmHG, EOA 1.09 cm2, DI 0.32. Washing jets observed. Normal functioning prosthesis. The aortic valve has been repaired/replaced. Aortic valve regurgitation is not visualized. There is a 23 mm St. Jude mechanical valve present in the aortic position. Procedure Date: 09/12/2004. 5. The inferior vena cava is normal in size with greater than 50% respiratory variability, suggesting right atrial pressure of 3 mmHg. Comparison(s): No significant change from prior study.  Event monitor 06/16-07/19/2021   Predominant rhythm is normal sinus rhythm with normal circadian variation.  There are very  frequent premature atrial complexes, frequent atrial couplets and frequent episodes of brief nonsustained ectopic atrial tachycardia  True atrial fibrillation is not recorded.  There are no episodes of significant bradycardia and there are rare isolated PVCs. A single 5 beat nocturnal run of accelerated idioventricular rhythm is seen.   Abnormal event monitor due to very frequent episodes of brief nonsustained ectopic atrial tachycardia.   No atrial fibrillation is seen.  No meaningful bradycardia or ventricular arrhythmia is seen.   EKG: Ordered today shows sinus bradycardia 56 bpm with first-degree AV block (PR 286 ms), left axis deviation, nonspecific ST segment changes in leads I and aVL and normal QTC 407 ms  Recent Labs: 07/01/2019: ALT 17; BUN 15; Creatinine, Ser 0.85; Hemoglobin 13.8; Platelets 325; Potassium 3.5; Sodium 139   Recent Lipid Panel Lab Results  Component Value Date/Time   CHOL 190 07/08/2014 10:53 AM   TRIG 188 (H) 07/08/2014 10:53 AM   HDL 46 07/08/2014 10:53 AM   CHOLHDL 4.1 07/08/2014 10:53 AM   LDLCALC 106 (H) 07/08/2014 10:53 AM    06/11/2019 Total cholesterol 145, HDL 52, LDL 75, triglycerides 97 Hemoglobin A1c 5.8% 07/01/2019 Hemoglobin 13.8, creatinine 0.5, potassium 3.5, normal liver function test 10/22/2019 INR 2.5  Wt Readings from Last 3 Encounters:  12/05/19 181 lb (82.1 kg)  08/17/19 181 lb 12.8 oz (82.5 kg)  07/06/19 180 lb  12.8 oz (82 kg)     Objective:    Vital Signs:  BP 138/76 (BP Location: Left Arm, Patient Position: Sitting, Cuff Size: Large)   Pulse (!) 56   Ht 5\' 6"  (1.676 m)   Wt 181 lb (82.1 kg)   BMI 29.21 kg/m     General: Alert, oriented x3, no distress, overweight Head: no evidence of trauma, PERRL, EOMI, no exophtalmos or lid lag, no myxedema, no xanthelasma; normal ears, nose and oropharynx Neck: normal jugular venous pulsations and no hepatojugular reflux; brisk carotid pulses without delay and no carotid  bruits Chest: clear to auscultation, no signs of consolidation by percussion or palpation, normal fremitus, symmetrical and full respiratory excursions Cardiovascular: normal position and quality of the apical impulse, regular rhythm, very crisp prosthetic valve sounds, no murmurs, rubs or gallops Abdomen: no tenderness or distention, no masses by palpation, no abnormal pulsatility or arterial bruits, normal bowel sounds, no hepatosplenomegaly Extremities: no clubbing, cyanosis or edema; 2+ radial, ulnar and brachial pulses bilaterally; 2+ right femoral, posterior tibial and dorsalis pedis pulses; 2+ left femoral, posterior tibial and dorsalis pedis pulses; no subclavian or femoral bruits Neurological: grossly nonfocal Psych: Normal mood and affect   ASSESSMENT & PLAN:    1. Near syncope   2. PAT (paroxysmal atrial tachycardia) (HCC)   3. History of mechanical aortic valve replacement   4. Essential hypertension   5. Hypercholesterolemia      1. Presyncope: The mechanism for her dizzy spells/near syncope remains elusive. Although she has bradycardia and a fairly long PR interval she has never shown evidence of severe bradycardia or AV block. She is on a tiny dose of beta-blockers for her atrial tachycardia. We will try to discontinue the diuretic, in case her symptoms are due to orthostatic hypotension and placed on losartan as a stand-alone antihypertensive.  2. PAT: We are trying to avoid higher doses of beta-blockers, true antiarrhythmics, so that we do not precipitate need for pacemaker therapy. 3. Mechanical AVR: Stable aortic valve gradients, barring any new clinical complaints we will continue with every other year echocardiography. 4. HTN: Fair control. Avoid excessively tight blood pressure control since she seems to be prone to near syncope. 5. HLP: On statin, labs followed by Dr.Ehinger.  I think the diagnosis of CAD is doubtful and the current LDL cholesterol 75 is quite  satisfactory.   COVID-19 Education: Patient Instructions  Medication Instructions:  STOP the Losartan-Hydrochlorothiazide START Losartan 100 mg once daily  *If you need a refill on your cardiac medications before your next appointment, please call your pharmacy*   Lab Work: None ordered If you have labs (blood work) drawn today and your tests are completely normal, you will receive your results only by: Marland Kitchen MyChart Message (if you have MyChart) OR . A paper copy in the mail If you have any lab test that is abnormal or we need to change your treatment, we will call you to review the results.   Testing/Procedures: None ordered   Follow-Up: At [facility], you and your health needs are our priority.  As part of our continuing mission to provide you with exceptional heart care, we have created designated Provider Care Teams.  These Care Teams include your primary Cardiologist (physician) and Advanced Practice Providers (APPs -  Physician Assistants and Nurse Practitioners) who all work together to provide you with the care you need, when you need it.  We recommend signing up for the patient portal called "MyChart".  Sign up  information is provided on this After Visit Summary.  MyChart is used to connect with patients for Virtual Visits (Telemedicine).  Patients are able to view lab/test results, encounter notes, upcoming appointments, etc.  Non-urgent messages can be sent to your provider as well.   To learn more about what you can do with MyChart, go to [email or web address].    Your next appointment:   3 month(s)  The format for your next appointment:   In Person  Provider:   You may see Thurmon Fair, MD or one of the following Advanced Practice Providers on your designated Care Team:    Azalee Course, PA-C  Micah Flesher, [place] or   Judy Pimple, [place]   Other Instructions Dr. Royann Shivers would like you to check your blood pressure daily for the next 2 weeks.  Keep a journal  of these daily blood pressure and heart rate readings and call our office or send a message through MyChart with the results. Thank you!      Signed, Thurmon Fair, MD  12/08/2019 9:02 PM     [facility]

## 2019-12-14 ENCOUNTER — Other Ambulatory Visit: Payer: Self-pay | Admitting: Family Medicine

## 2019-12-14 DIAGNOSIS — I359 Nonrheumatic aortic valve disorder, unspecified: Secondary | ICD-10-CM | POA: Diagnosis not present

## 2019-12-14 DIAGNOSIS — M8588 Other specified disorders of bone density and structure, other site: Secondary | ICD-10-CM | POA: Diagnosis not present

## 2019-12-14 DIAGNOSIS — M899 Disorder of bone, unspecified: Secondary | ICD-10-CM | POA: Diagnosis not present

## 2019-12-14 DIAGNOSIS — I700 Atherosclerosis of aorta: Secondary | ICD-10-CM | POA: Diagnosis not present

## 2019-12-14 DIAGNOSIS — R7303 Prediabetes: Secondary | ICD-10-CM | POA: Diagnosis not present

## 2019-12-14 DIAGNOSIS — I2510 Atherosclerotic heart disease of native coronary artery without angina pectoris: Secondary | ICD-10-CM | POA: Diagnosis not present

## 2019-12-14 DIAGNOSIS — R55 Syncope and collapse: Secondary | ICD-10-CM

## 2019-12-14 DIAGNOSIS — I208 Other forms of angina pectoris: Secondary | ICD-10-CM | POA: Diagnosis not present

## 2019-12-14 DIAGNOSIS — E7800 Pure hypercholesterolemia, unspecified: Secondary | ICD-10-CM | POA: Diagnosis not present

## 2019-12-14 DIAGNOSIS — I10 Essential (primary) hypertension: Secondary | ICD-10-CM | POA: Diagnosis not present

## 2019-12-14 DIAGNOSIS — K2100 Gastro-esophageal reflux disease with esophagitis, without bleeding: Secondary | ICD-10-CM | POA: Diagnosis not present

## 2019-12-18 DIAGNOSIS — L309 Dermatitis, unspecified: Secondary | ICD-10-CM | POA: Diagnosis not present

## 2019-12-18 DIAGNOSIS — L820 Inflamed seborrheic keratosis: Secondary | ICD-10-CM | POA: Diagnosis not present

## 2020-01-03 ENCOUNTER — Ambulatory Visit
Admission: RE | Admit: 2020-01-03 | Discharge: 2020-01-03 | Disposition: A | Discharge status: Home / Self Care | Payer: PPO | Source: Ambulatory Visit | Attending: Family Medicine | Admitting: Family Medicine

## 2020-01-03 ENCOUNTER — Other Ambulatory Visit: Payer: Self-pay

## 2020-01-03 DIAGNOSIS — R55 Syncope and collapse: Secondary | ICD-10-CM | POA: Diagnosis not present

## 2020-01-11 ENCOUNTER — Telehealth: Payer: Self-pay | Admitting: *Deleted

## 2020-01-11 NOTE — Telephone Encounter (Signed)
Thanks. She had edema in the past when taking 5-10 mg of amlodipine. I am hoping we can "get away" with a very low dose of amlodipine to control her BP, while not causing leg swelling. She is already on max dose ARB, cannot take more beta blocker due to bradycardia and we are trying to avoid diuretics due to her syncope. This limits other choices for BP control, which is why I want to try the lowest available dose of amlodipine. I reviewed the CT. No acute findings, but there is evidence of an old ischemic stroke. All the more reason to keep BP in good range and continue anticoagulation, to prevent another stroke.

## 2020-01-11 NOTE — Telephone Encounter (Signed)
Call placed to the patient to add Amlodipine 2.5 mg once daily after receiving her blood pressure readings. They were elevated after stopping the diuretics. The patient stated that last time she took Amlodipine it made her swell.  She also stated that she had a CT of the head recently and would like for Dr. Sallyanne Kuster to review this. According to the patient, her PCP wanted to make sure her blood pressure and coumadin were under control.

## 2020-01-15 MED ORDER — AMLODIPINE BESYLATE 2.5 MG PO TABS: 2.5000 mg | ORAL_TABLET | Freq: Every day | ORAL | 3 refills | Status: DC

## 2020-01-15 NOTE — Telephone Encounter (Signed)
The patient has been made aware and agreed to start the Amlodipine 2.5 mg once daily. This has been sent in for her.

## 2020-01-16 ENCOUNTER — Ambulatory Visit (INDEPENDENT_AMBULATORY_CARE_PROVIDER_SITE_OTHER): Payer: PPO

## 2020-01-16 ENCOUNTER — Other Ambulatory Visit: Payer: Self-pay

## 2020-01-16 DIAGNOSIS — Z952 Presence of prosthetic heart valve: Secondary | ICD-10-CM | POA: Diagnosis not present

## 2020-01-16 DIAGNOSIS — Z7901 Long term (current) use of anticoagulants: Secondary | ICD-10-CM | POA: Diagnosis not present

## 2020-01-16 LAB — POCT INR: INR: 2.2 (ref 2.0–3.0)

## 2020-01-16 NOTE — Patient Instructions (Signed)
Continue same dose 1 tablet daily except 1.5 tablets each Wednesday.  Repeat INR in 6 weeks.

## 2020-01-23 ENCOUNTER — Other Ambulatory Visit: Payer: Self-pay | Admitting: Cardiovascular Disease

## 2020-02-27 ENCOUNTER — Ambulatory Visit: Payer: PPO | Admitting: Cardiovascular Disease

## 2020-02-27 ENCOUNTER — Ambulatory Visit (INDEPENDENT_AMBULATORY_CARE_PROVIDER_SITE_OTHER): Payer: PPO

## 2020-02-27 ENCOUNTER — Other Ambulatory Visit: Payer: Self-pay

## 2020-02-27 ENCOUNTER — Encounter: Payer: Self-pay | Admitting: Cardiovascular Disease

## 2020-02-27 VITALS — BP 150/70 | HR 54 | Ht 66.0 in | Wt 191.0 lb

## 2020-02-27 DIAGNOSIS — I10 Essential (primary) hypertension: Secondary | ICD-10-CM | POA: Diagnosis not present

## 2020-02-27 DIAGNOSIS — Z952 Presence of prosthetic heart valve: Secondary | ICD-10-CM | POA: Diagnosis not present

## 2020-02-27 DIAGNOSIS — Z7901 Long term (current) use of anticoagulants: Secondary | ICD-10-CM | POA: Diagnosis not present

## 2020-02-27 DIAGNOSIS — E7800 Pure hypercholesterolemia, unspecified: Secondary | ICD-10-CM | POA: Diagnosis not present

## 2020-02-27 DIAGNOSIS — I471 Supraventricular tachycardia: Secondary | ICD-10-CM | POA: Diagnosis not present

## 2020-02-27 LAB — POCT INR: INR: 2.1 (ref 2.0–3.0)

## 2020-02-27 MED ORDER — METOPROLOL SUCCINATE ER 25 MG PO TB24: 25.0000 mg | ORAL_TABLET | Freq: Every day | ORAL | 3 refills | Status: DC

## 2020-02-27 NOTE — Patient Instructions (Signed)
Continue same dose 1 tablet daily except 1.5 tablets each Wednesday.  Repeat INR in 6 weeks.

## 2020-02-27 NOTE — Progress Notes (Signed)
Cardiology office Note    Date:  03/01/2020   ID:  Sharon Rose, DOB 24-Jan-1941, MRN [id number]  PCP:  Blair Heys, MD  Cardiologist:  Thurmon Fair, MD  Electrophysiologist:  None   Evaluation Performed:  Follow-Up Visit  Chief Complaint: Dizziness History of Present Illness:    Sharon Rose is a 80 y.o. female with a mechanical aortic valve prosthesis, hypertension and hyperlipidemia.  After we stopped her diuretic a few weeks ago she has not had any further episodes of syncope/near syncope and has noticed that she has a much steadier gait than she did in the past.  On the other hand, she has noticed that her blood pressure is slightly higher and that towards the end of the day she has mild swelling of her ankles the edema has always resolved by the next morning.  She has an occasional mild headache.  She denies dyspnea, angina, focal neurological events, falls or bleeding, claudication or focal neurological complaints.  She is taking the maximum dose of losartan as tolerated dose of beta-blocker (due to bradycardia).  She is on a very low-dose of amlodipine.  In the past higher doses of amlodipine led to significant leg swelling..  Her echocardiogram 07/23/2019 showed normal left ventricular systolic function and normal function of her aortic valve prosthesis. A 30-day extended arrhythmia monitor in June-July showed the same previously detected episodes of brief paroxysmal atrial tachycardia, but no atrial fibrillation, no meaningful bradycardia and no ventricular arrhythmia.  In the past she has had issues of bradycardia with beta-blockers.  Sharon Rose  had replacement of a severely stenotic, probably congenitally bicuspid aortic valve in 2006 with a 23 mm mechanical valve. She has HTN and dyslipidemia that is treated with a statin. Coronary angiography before surgery showed total occlusion of the mid second oblique marginal artery by preoperative coronary angiography in 2006,  but no angina and no evidence of ischemia by nuclear stress testing. Repeat coronary angiography in March 2011 did not confirm occlusion of the second oblique marginal branch. She has normal LV function and no history of embolic events. In 2014 she presented with thiazide induced hypokalemia and bradycardia. Her heart rate normalized after electrolyte correction. We tried to stop her diuretic completely, but this was associated with ankle swelling and difficult to control blood pressure. She has a history of postoperative atrial fibrillation without detected recurrence since then. Normal 30 day event monitor in July 2017.   Past Medical History:  Diagnosis Date  . CAD (coronary artery disease)    NUCLEAR STRESS TEST, 10/07/2008 - no ischemia  . Dyspnea on exertion   . H/O aortic valve replacement    2D ECHO, 04/26/2011 - EF 50-55%, normal-mild  . Hypertension   . NSTEMI (non-ST elevated myocardial infarction) (HCC) 12/2004   Secondary to occlusion of OM 2, small vessel and medical therapy recommended; cath 2011 with no CAD   Past Surgical History:  Procedure Laterality Date  . AORTIC VALVE REPLACEMENT  09/12/2004   St. Jude mechanical aortic valve, model #23AK-501, serial [id number]  . CARDIAC CATHETERIZATION Left 03/25/2009   Normal cors  . CARDIAC CATHETERIZATION  01/20/2005   Medical management for occluded and possibly revascularized OM 2, string sign seen  . CARDIAC CATHETERIZATION  08/11/2004   Elective aortic valve replacement, severe aortic valvre stenosis with functional class II to III symptomatology     Current Meds  Medication Sig  . amLODipine (NORVASC) 2.5 MG tablet Take 1 tablet (2.5 mg total)  by mouth daily.  Marland Kitchen aspirin EC 81 MG tablet Take 81 mg by mouth daily.  . calcium carbonate (OS-CAL) 600 MG TABS Take 600 mg by mouth 2 (two) times daily with a meal.  . estradiol (ESTRACE) 0.1 MG/GM vaginal cream Place 1 Applicatorful vaginally every other day.   . losartan (COZAAR) 100  MG tablet Take 1 tablet (100 mg total) by mouth daily.  . metoprolol succinate (TOPROL-XL) 25 MG 24 hr tablet Take 1 tablet (25 mg total) by mouth daily. Take with or immediately following a meal.  . Multiple Vitamin (MULTIVITAMIN) tablet Take 1 tablet by mouth daily.  Marland Kitchen omeprazole (PRILOSEC) 20 MG capsule Take 20 mg by mouth daily.  . rosuvastatin (CRESTOR) 5 MG tablet Take 5 mg by mouth every evening.   . warfarin (COUMADIN) 5 MG tablet TAKE 1 TO 1&1/2 TABLETS BY MOUTH EVERY DAY AS DIRECTED BY COUMADIN CLINIC  . [DISCONTINUED] metoprolol tartrate (LOPRESSOR) 25 MG tablet TAKE 1/2 TABLET (12.5MG ) BY MOUTH 2 TIMES DAILY.     Allergies:   Codeine, Statins, Sulfa antibiotics, and Verapamil   Social History   Tobacco Use  . Smoking status: Never Smoker  . Smokeless tobacco: Never Used  Substance Use Topics  . Alcohol use: No  . Drug use: No     Family Hx: The patient's family history is not on file.  ROS:   Please see the history of present illness.   All other systems are reviewed and are negative.  Prior CV studies:    Labs/Other Tests and Data Reviewed:    ECHO 07/23/2019:  1. Left ventricular ejection fraction, by estimation, is 50 to 55%. The left ventricle has low normal function. The left ventricle has no regional wall motion abnormalities. There is mild  concentric left ventricular hypertrophy. Left ventricular diastolic parameters are consistent with Grade I diastolic dysfunction (impaired relaxation). 2. Right ventricular systolic function is low normal. The right ventricular size is normal. There is normal pulmonary artery systolic pressure. The estimated right ventricular systolic pressure is 23.1 mmHg. 3. The mitral valve is grossly normal. Trivial mitral valve regurgitation. No evidence of mitral stenosis. 4. 23 mm mechanical AoV. V max 2.56 m/s, MG 12 mmHG, EOA 1.09 cm2, DI 0.32. Washing jets observed. Normal functioning prosthesis. The aortic valve has been  repaired/replaced. Aortic valve regurgitation is not visualized. There is a 23 mm St. Jude mechanical valve present in the aortic position. Procedure Date: 09/12/2004. 5. The inferior vena cava is normal in size with greater than 50% respiratory variability, suggesting right atrial pressure of 3 mmHg. Comparison(s): No significant change from prior study.  Event monitor 06/16-07/19/2021   Predominant rhythm is normal sinus rhythm with normal circadian variation.  There are very frequent premature atrial complexes, frequent atrial couplets and frequent episodes of brief nonsustained ectopic atrial tachycardia  True atrial fibrillation is not recorded.  There are no episodes of significant bradycardia and there are rare isolated PVCs. A single 5 beat nocturnal run of accelerated idioventricular rhythm is seen.   Abnormal event monitor due to very frequent episodes of brief nonsustained ectopic atrial tachycardia.   No atrial fibrillation is seen.  No meaningful bradycardia or ventricular arrhythmia is seen.   EKG: Not ordered today.  The previous tracing shows sinus bradycardia 56 bpm with first-degree AV block (PR 286 ms), left axis deviation, nonspecific ST segment changes in leads I and aVL and normal QTC 407 ms  Recent Labs: 07/01/2019: ALT 17; BUN 15; Creatinine, Ser  0.85; Hemoglobin 13.8; Platelets 325; Potassium 3.5; Sodium 139   Recent Lipid Panel Lab Results  Component Value Date/Time   CHOL 190 07/08/2014 10:53 AM   TRIG 188 (H) 07/08/2014 10:53 AM   HDL 46 07/08/2014 10:53 AM   CHOLHDL 4.1 07/08/2014 10:53 AM   LDLCALC 106 (H) 07/08/2014 10:53 AM    06/11/2019 Total cholesterol 145, HDL 52, LDL 75, triglycerides 97 Hemoglobin A1c 5.8% 07/01/2019 Hemoglobin 13.8, creatinine 0.5, potassium 3.5, normal liver function test 10/22/2019 INR 2.5  Wt Readings from Last 3 Encounters:  02/27/20 191 lb (86.6 kg)  12/05/19 181 lb (82.1 kg)  08/17/19 181 lb 12.8 oz (82.5 kg)      Objective:    Vital Signs:  BP (!) 150/70 (BP Location: Left Arm, Patient Position: Sitting)   Pulse (!) 54   Ht 5\' 6"  (1.676 m)   Wt 191 lb (86.6 kg)   SpO2 96%   BMI 30.83 kg/m      General: Alert, oriented x3, no distress, mildly obese Head: no evidence of trauma, PERRL, EOMI, no exophtalmos or lid lag, no myxedema, no xanthelasma; normal ears, nose and oropharynx Neck: normal jugular venous pulsations and no hepatojugular reflux; brisk carotid pulses without delay and no carotid bruits Chest: clear to auscultation, no signs of consolidation by percussion or palpation, normal fremitus, symmetrical and full respiratory excursions Cardiovascular: normal position and quality of the apical impulse, regular rhythm, very crisp prosthetic valve sounds with no diastolic murmurs, rubs or gallops.  Very short aortic ejection murmur Abdomen: no tenderness or distention, no masses by palpation, no abnormal pulsatility or arterial bruits, normal bowel sounds, no hepatosplenomegaly Extremities: no clubbing, cyanosis or edema; 2+ radial, ulnar and brachial pulses bilaterally; 2+ right femoral, posterior tibial and dorsalis pedis pulses; 2+ left femoral, posterior tibial and dorsalis pedis pulses; no subclavian or femoral bruits Neurological: grossly nonfocal Psych: Normal mood and affect   ASSESSMENT & PLAN:    1. PAT (paroxysmal atrial tachycardia) (HCC)   2. History of mechanical aortic valve replacement   3. Essential hypertension   4. Hypercholesterolemia      1. PAT: Somatic.  Avoiding high doses of beta-blockers or true antiarrhythmics since we are trying to avoid pacemaker implantation. 2. Mechanical AVR: Normal valve function by echo 2021 and to repeat the echo next year barring development of new symptoms. 3. HTN: Acceptable blood pressure level.  Diuretics caused orthostatic hypotension and near syncope.  Switch to once daily metoprolol succinate since she finds it hard to cut the  tartrate tablets accurately. 4. HLP: On statin, labs followed by Dr.Ehinger.  She does not have CAD or PAD.  LDL less than 100 is acceptable.   Patient Instructions  Medication Instructions:  STOP the Metoprolol Tartrate START Metoprolol Succinate 25 mg once daily  *If you need a refill on your cardiac medications before your next appointment, please call your pharmacy*   Lab Work: None ordered If you have labs (blood work) drawn today and your tests are completely normal, you will receive your results only by: Marland Kitchen MyChart Message (if you have MyChart) OR . A paper copy in the mail If you have any lab test that is abnormal or we need to change your treatment, we will call you to review the results.   Testing/Procedures: None ordered   Follow-Up: At [facility], you and your health needs are our priority.  As part of our continuing mission to provide you with exceptional heart care, we have  created designated Provider Care Teams.  These Care Teams include your primary Cardiologist (physician) and Advanced Practice Providers (APPs -  Physician Assistants and Nurse Practitioners) who all work together to provide you with the care you need, when you need it.  We recommend signing up for the patient portal called "MyChart".  Sign up information is provided on this After Visit Summary.  MyChart is used to connect with patients for Virtual Visits (Telemedicine).  Patients are able to view lab/test results, encounter notes, upcoming appointments, etc.  Non-urgent messages can be sent to your provider as well.   To learn more about what you can do with MyChart, go to [email or web address].    Your next appointment:   12 month(s)  The format for your next appointment:   In Person  Provider:   You may see Thurmon Fair, MD or one of the following Advanced Practice Providers on your designated Care Team:    Azalee Course, PA-C  Micah Flesher, [place] or   Judy Pimple,  PA-C       Signed, Thurmon Fair, MD  03/01/2020 11:51 AM     [facility]

## 2020-02-27 NOTE — Patient Instructions (Signed)
Medication Instructions:  STOP the Metoprolol Tartrate START Metoprolol Succinate 25 mg once daily  *If you need a refill on your cardiac medications before your next appointment, please call your pharmacy*   Lab Work: None ordered If you have labs (blood work) drawn today and your tests are completely normal, you will receive your results only by: Marland Kitchen MyChart Message (if you have MyChart) OR . A paper copy in the mail If you have any lab test that is abnormal or we need to change your treatment, we will call you to review the results.   Testing/Procedures: None ordered   Follow-Up: At [facility], you and your health needs are our priority.  As part of our continuing mission to provide you with exceptional heart care, we have created designated Provider Care Teams.  These Care Teams include your primary Cardiologist (physician) and Advanced Practice Providers (APPs -  Physician Assistants and Nurse Practitioners) who all work together to provide you with the care you need, when you need it.  We recommend signing up for the patient portal called "MyChart".  Sign up information is provided on this After Visit Summary.  MyChart is used to connect with patients for Virtual Visits (Telemedicine).  Patients are able to view lab/test results, encounter notes, upcoming appointments, etc.  Non-urgent messages can be sent to your provider as well.   To learn more about what you can do with MyChart, go to [email or web address].    Your next appointment:   12 month(s)  The format for your next appointment:   In Person  Provider:   You may see Sanda Klein, MD or one of the following Advanced Practice Providers on your designated Care Team:    Almyra Deforest, PA-C  Fabian Sharp, PA-C or   Roby Lofts, [place]

## 2020-04-09 ENCOUNTER — Other Ambulatory Visit: Payer: Self-pay | Admitting: Cardiovascular Disease

## 2020-04-09 ENCOUNTER — Ambulatory Visit (INDEPENDENT_AMBULATORY_CARE_PROVIDER_SITE_OTHER): Payer: PPO

## 2020-04-09 ENCOUNTER — Other Ambulatory Visit: Payer: Self-pay

## 2020-04-09 DIAGNOSIS — Z7901 Long term (current) use of anticoagulants: Secondary | ICD-10-CM | POA: Diagnosis not present

## 2020-04-09 DIAGNOSIS — Z952 Presence of prosthetic heart valve: Secondary | ICD-10-CM

## 2020-04-09 LAB — POCT INR: INR: 1.9 — AB (ref 2.0–3.0)

## 2020-04-09 NOTE — Patient Instructions (Signed)
Take 2 tablets tonight only and then Continue same dose 1 tablet daily except 1.5 tablets each Wednesday.  Repeat INR in 6 weeks.

## 2020-04-22 ENCOUNTER — Other Ambulatory Visit: Payer: Self-pay | Admitting: Cardiovascular Disease

## 2020-05-08 ENCOUNTER — Other Ambulatory Visit: Payer: Self-pay | Admitting: Cardiovascular Disease

## 2020-05-08 ENCOUNTER — Telehealth: Payer: Self-pay | Admitting: Cardiovascular Disease

## 2020-05-08 NOTE — Telephone Encounter (Signed)
Refill has been sent in a previous encounter

## 2020-05-08 NOTE — Telephone Encounter (Signed)
*  STAT* If patient is at the pharmacy, call can be transferred to refill team.   1. Which medications need to be refilled? (please list name of each medication and dose if known) warfarin (COUMADIN) 5 MG tablet  2. Which pharmacy/location (including street and city if local pharmacy) is medication to be sent to? [id number] [place], [place] - [address]  3. Do they need a 30 day or 90 day supply?  Pt picked up script on 04/25/20 but she misplaced it. Pt only have enough for 1 week left.

## 2020-05-21 ENCOUNTER — Ambulatory Visit (INDEPENDENT_AMBULATORY_CARE_PROVIDER_SITE_OTHER): Payer: PPO

## 2020-05-21 ENCOUNTER — Other Ambulatory Visit: Payer: Self-pay

## 2020-05-21 DIAGNOSIS — Z952 Presence of prosthetic heart valve: Secondary | ICD-10-CM | POA: Diagnosis not present

## 2020-05-21 DIAGNOSIS — Z7901 Long term (current) use of anticoagulants: Secondary | ICD-10-CM

## 2020-05-21 LAB — POCT INR: INR: 2.6 (ref 2.0–3.0)

## 2020-05-21 NOTE — Patient Instructions (Signed)
Continue same dose 1 tablet daily except 1.5 tablets each Wednesday.  Repeat INR in 6 weeks.

## 2020-06-13 DIAGNOSIS — Z1389 Encounter for screening for other disorder: Secondary | ICD-10-CM | POA: Diagnosis not present

## 2020-06-13 DIAGNOSIS — E7800 Pure hypercholesterolemia, unspecified: Secondary | ICD-10-CM | POA: Diagnosis not present

## 2020-06-13 DIAGNOSIS — I2510 Atherosclerotic heart disease of native coronary artery without angina pectoris: Secondary | ICD-10-CM | POA: Diagnosis not present

## 2020-06-13 DIAGNOSIS — M899 Disorder of bone, unspecified: Secondary | ICD-10-CM | POA: Diagnosis not present

## 2020-06-13 DIAGNOSIS — K2100 Gastro-esophageal reflux disease with esophagitis, without bleeding: Secondary | ICD-10-CM | POA: Diagnosis not present

## 2020-06-13 DIAGNOSIS — I700 Atherosclerosis of aorta: Secondary | ICD-10-CM | POA: Diagnosis not present

## 2020-06-13 DIAGNOSIS — Z0000 Encounter for general adult medical examination without abnormal findings: Secondary | ICD-10-CM | POA: Diagnosis not present

## 2020-06-13 DIAGNOSIS — I208 Other forms of angina pectoris: Secondary | ICD-10-CM | POA: Diagnosis not present

## 2020-06-13 DIAGNOSIS — R7303 Prediabetes: Secondary | ICD-10-CM | POA: Diagnosis not present

## 2020-06-13 DIAGNOSIS — I10 Essential (primary) hypertension: Secondary | ICD-10-CM | POA: Diagnosis not present

## 2020-06-13 DIAGNOSIS — I359 Nonrheumatic aortic valve disorder, unspecified: Secondary | ICD-10-CM | POA: Diagnosis not present

## 2020-07-02 ENCOUNTER — Other Ambulatory Visit: Payer: Self-pay

## 2020-07-02 ENCOUNTER — Ambulatory Visit (INDEPENDENT_AMBULATORY_CARE_PROVIDER_SITE_OTHER): Payer: PPO

## 2020-07-02 DIAGNOSIS — Z7901 Long term (current) use of anticoagulants: Secondary | ICD-10-CM

## 2020-07-02 DIAGNOSIS — Z952 Presence of prosthetic heart valve: Secondary | ICD-10-CM | POA: Diagnosis not present

## 2020-07-02 LAB — POCT INR: INR: 2.3 (ref 2.0–3.0)

## 2020-07-02 NOTE — Patient Instructions (Signed)
Continue same dose 1 tablet daily except 1.5 tablets each Wednesday.  Repeat INR in 6 weeks.

## 2020-07-04 DIAGNOSIS — L820 Inflamed seborrheic keratosis: Secondary | ICD-10-CM | POA: Diagnosis not present

## 2020-07-04 DIAGNOSIS — D220 Melanocytic nevi of lip: Secondary | ICD-10-CM | POA: Diagnosis not present

## 2020-07-04 DIAGNOSIS — D1801 Hemangioma of skin and subcutaneous tissue: Secondary | ICD-10-CM | POA: Diagnosis not present

## 2020-07-04 DIAGNOSIS — D229 Melanocytic nevi, unspecified: Secondary | ICD-10-CM | POA: Diagnosis not present

## 2020-07-04 DIAGNOSIS — L905 Scar conditions and fibrosis of skin: Secondary | ICD-10-CM | POA: Diagnosis not present

## 2020-07-04 DIAGNOSIS — Z85828 Personal history of other malignant neoplasm of skin: Secondary | ICD-10-CM | POA: Diagnosis not present

## 2020-07-04 DIAGNOSIS — D485 Neoplasm of uncertain behavior of skin: Secondary | ICD-10-CM | POA: Diagnosis not present

## 2020-07-04 DIAGNOSIS — L814 Other melanin hyperpigmentation: Secondary | ICD-10-CM | POA: Diagnosis not present

## 2020-07-04 DIAGNOSIS — D2239 Melanocytic nevi of other parts of face: Secondary | ICD-10-CM | POA: Diagnosis not present

## 2020-08-09 ENCOUNTER — Other Ambulatory Visit: Payer: Self-pay | Admitting: Cardiovascular Disease

## 2020-08-13 ENCOUNTER — Other Ambulatory Visit: Payer: Self-pay

## 2020-08-13 ENCOUNTER — Ambulatory Visit (INDEPENDENT_AMBULATORY_CARE_PROVIDER_SITE_OTHER): Payer: PPO

## 2020-08-13 DIAGNOSIS — Z7901 Long term (current) use of anticoagulants: Secondary | ICD-10-CM | POA: Diagnosis not present

## 2020-08-13 DIAGNOSIS — Z952 Presence of prosthetic heart valve: Secondary | ICD-10-CM

## 2020-08-13 LAB — POCT INR: INR: 2.2 (ref 2.0–3.0)

## 2020-08-13 NOTE — Patient Instructions (Signed)
Continue same dose 1 tablet daily except 1.5 tablets each Wednesday.  Repeat INR in 6 weeks.

## 2020-09-12 ENCOUNTER — Telehealth: Payer: Self-pay | Admitting: Cardiovascular Disease

## 2020-09-12 NOTE — Telephone Encounter (Signed)
Called patient, pt reports she tested positive for covid 09/05/2020 and is still testing positive. She would like to know when she can begin to leave the house. Pt denies any cardiac symptoms at this time, denies chest pain and shortness of breath. Pt reports she "just has a cough". Advised pt to call her PCP for advice on quarantining, advised pt to call back if she has any cardiac-related symptoms. Pt verbalized understanding.

## 2020-09-12 NOTE — Telephone Encounter (Signed)
   Pt said on her bday 09/05/20 she was tested covid+, today she tested again and still showing she is covid+. She wanted to know if that's normal and what she needs to do next

## 2020-09-12 NOTE — Telephone Encounter (Signed)
Follow Up:     Patient is returning Marissa call from today.r

## 2020-09-12 NOTE — Telephone Encounter (Signed)
Attempted to call patient on both listed numbers. Left message for patient to call office back on mobile phone, no option to leave voicemail on home phone.

## 2020-09-15 DIAGNOSIS — R509 Fever, unspecified: Secondary | ICD-10-CM | POA: Diagnosis not present

## 2020-09-15 DIAGNOSIS — U071 COVID-19: Secondary | ICD-10-CM | POA: Diagnosis not present

## 2020-09-17 DIAGNOSIS — U071 COVID-19: Secondary | ICD-10-CM | POA: Diagnosis not present

## 2020-09-24 ENCOUNTER — Other Ambulatory Visit: Payer: Self-pay

## 2020-09-24 ENCOUNTER — Ambulatory Visit (INDEPENDENT_AMBULATORY_CARE_PROVIDER_SITE_OTHER): Payer: PPO

## 2020-09-24 DIAGNOSIS — Z952 Presence of prosthetic heart valve: Secondary | ICD-10-CM

## 2020-09-24 DIAGNOSIS — Z7901 Long term (current) use of anticoagulants: Secondary | ICD-10-CM | POA: Diagnosis not present

## 2020-09-24 LAB — POCT INR: INR: 2.1 (ref 2.0–3.0)

## 2020-09-24 NOTE — Patient Instructions (Signed)
Continue same dose 1 tablet daily except 1.5 tablets each Wednesday.  Repeat INR in 6 weeks.

## 2020-10-01 ENCOUNTER — Other Ambulatory Visit: Payer: Self-pay | Admitting: Cardiovascular Disease

## 2020-10-03 DIAGNOSIS — Z1231 Encounter for screening mammogram for malignant neoplasm of breast: Secondary | ICD-10-CM | POA: Diagnosis not present

## 2020-10-27 DIAGNOSIS — M545 Low back pain, unspecified: Secondary | ICD-10-CM | POA: Diagnosis not present

## 2020-10-27 DIAGNOSIS — R309 Painful micturition, unspecified: Secondary | ICD-10-CM | POA: Diagnosis not present

## 2020-10-27 DIAGNOSIS — Z23 Encounter for immunization: Secondary | ICD-10-CM | POA: Diagnosis not present

## 2020-11-05 ENCOUNTER — Ambulatory Visit (INDEPENDENT_AMBULATORY_CARE_PROVIDER_SITE_OTHER): Payer: PPO

## 2020-11-05 ENCOUNTER — Other Ambulatory Visit: Payer: Self-pay

## 2020-11-05 DIAGNOSIS — Z952 Presence of prosthetic heart valve: Secondary | ICD-10-CM

## 2020-11-05 DIAGNOSIS — Z7901 Long term (current) use of anticoagulants: Secondary | ICD-10-CM

## 2020-11-05 LAB — POCT INR: INR: 2.8 (ref 2.0–3.0)

## 2020-11-05 NOTE — Patient Instructions (Signed)
Continue same dose 1 tablet daily except 1.5 tablets each Wednesday.  Repeat INR in 6 weeks.

## 2020-11-20 ENCOUNTER — Other Ambulatory Visit: Payer: Self-pay | Admitting: Cardiovascular Disease

## 2020-11-26 DIAGNOSIS — K2100 Gastro-esophageal reflux disease with esophagitis, without bleeding: Secondary | ICD-10-CM | POA: Diagnosis not present

## 2020-11-26 DIAGNOSIS — J029 Acute pharyngitis, unspecified: Secondary | ICD-10-CM | POA: Diagnosis not present

## 2020-11-26 DIAGNOSIS — R252 Cramp and spasm: Secondary | ICD-10-CM | POA: Diagnosis not present

## 2020-12-04 DIAGNOSIS — Z961 Presence of intraocular lens: Secondary | ICD-10-CM | POA: Diagnosis not present

## 2020-12-04 DIAGNOSIS — H04123 Dry eye syndrome of bilateral lacrimal glands: Secondary | ICD-10-CM | POA: Diagnosis not present

## 2020-12-04 DIAGNOSIS — H43393 Other vitreous opacities, bilateral: Secondary | ICD-10-CM | POA: Diagnosis not present

## 2020-12-04 DIAGNOSIS — H26492 Other secondary cataract, left eye: Secondary | ICD-10-CM | POA: Diagnosis not present

## 2020-12-13 DIAGNOSIS — S8991XA Unspecified injury of right lower leg, initial encounter: Secondary | ICD-10-CM | POA: Diagnosis not present

## 2020-12-16 DIAGNOSIS — S8001XA Contusion of right knee, initial encounter: Secondary | ICD-10-CM | POA: Insufficient documentation

## 2020-12-16 DIAGNOSIS — M25561 Pain in right knee: Secondary | ICD-10-CM | POA: Diagnosis not present

## 2020-12-17 ENCOUNTER — Other Ambulatory Visit (HOSPITAL_COMMUNITY): Payer: Self-pay | Admitting: Orthopedic Surgery

## 2020-12-17 ENCOUNTER — Other Ambulatory Visit: Payer: Self-pay

## 2020-12-17 ENCOUNTER — Ambulatory Visit (INDEPENDENT_AMBULATORY_CARE_PROVIDER_SITE_OTHER): Payer: PPO

## 2020-12-17 DIAGNOSIS — Z952 Presence of prosthetic heart valve: Secondary | ICD-10-CM

## 2020-12-17 DIAGNOSIS — M25561 Pain in right knee: Secondary | ICD-10-CM

## 2020-12-17 DIAGNOSIS — Z7901 Long term (current) use of anticoagulants: Secondary | ICD-10-CM

## 2020-12-17 LAB — POCT INR: INR: 3.2 — AB (ref 2.0–3.0)

## 2020-12-17 NOTE — Patient Instructions (Signed)
Continue same dose 1 tablet daily except 1.5 tablets each Wednesday.  Repeat INR in 4 weeks.  Eat greens tonight.

## 2020-12-19 ENCOUNTER — Encounter (HOSPITAL_COMMUNITY): Payer: Self-pay

## 2020-12-19 ENCOUNTER — Other Ambulatory Visit: Payer: Self-pay

## 2020-12-19 ENCOUNTER — Ambulatory Visit (HOSPITAL_COMMUNITY)
Admission: RE | Admit: 2020-12-19 | Discharge: 2020-12-19 | Disposition: A | Discharge status: Home / Self Care | Payer: PPO | Source: Ambulatory Visit | Attending: Orthopedic Surgery | Admitting: Orthopedic Surgery

## 2020-12-19 DIAGNOSIS — S82144A Nondisplaced bicondylar fracture of right tibia, initial encounter for closed fracture: Secondary | ICD-10-CM | POA: Diagnosis not present

## 2020-12-19 DIAGNOSIS — M1711 Unilateral primary osteoarthritis, right knee: Secondary | ICD-10-CM | POA: Diagnosis not present

## 2020-12-19 DIAGNOSIS — M25561 Pain in right knee: Secondary | ICD-10-CM | POA: Diagnosis not present

## 2020-12-19 DIAGNOSIS — S82154A Nondisplaced fracture of right tibial tuberosity, initial encounter for closed fracture: Secondary | ICD-10-CM | POA: Diagnosis not present

## 2020-12-22 DIAGNOSIS — I208 Other forms of angina pectoris: Secondary | ICD-10-CM | POA: Diagnosis not present

## 2020-12-22 DIAGNOSIS — M899 Disorder of bone, unspecified: Secondary | ICD-10-CM | POA: Diagnosis not present

## 2020-12-22 DIAGNOSIS — J029 Acute pharyngitis, unspecified: Secondary | ICD-10-CM | POA: Diagnosis not present

## 2020-12-22 DIAGNOSIS — I359 Nonrheumatic aortic valve disorder, unspecified: Secondary | ICD-10-CM | POA: Diagnosis not present

## 2020-12-22 DIAGNOSIS — R7303 Prediabetes: Secondary | ICD-10-CM | POA: Diagnosis not present

## 2020-12-22 DIAGNOSIS — R350 Frequency of micturition: Secondary | ICD-10-CM | POA: Diagnosis not present

## 2020-12-22 DIAGNOSIS — I700 Atherosclerosis of aorta: Secondary | ICD-10-CM | POA: Diagnosis not present

## 2020-12-22 DIAGNOSIS — K2100 Gastro-esophageal reflux disease with esophagitis, without bleeding: Secondary | ICD-10-CM | POA: Diagnosis not present

## 2020-12-22 DIAGNOSIS — I10 Essential (primary) hypertension: Secondary | ICD-10-CM | POA: Diagnosis not present

## 2020-12-22 DIAGNOSIS — I2510 Atherosclerotic heart disease of native coronary artery without angina pectoris: Secondary | ICD-10-CM | POA: Diagnosis not present

## 2020-12-22 DIAGNOSIS — E7800 Pure hypercholesterolemia, unspecified: Secondary | ICD-10-CM | POA: Diagnosis not present

## 2020-12-23 DIAGNOSIS — S8001XA Contusion of right knee, initial encounter: Secondary | ICD-10-CM | POA: Diagnosis not present

## 2020-12-23 DIAGNOSIS — S5001XA Contusion of right elbow, initial encounter: Secondary | ICD-10-CM | POA: Diagnosis not present

## 2021-01-14 ENCOUNTER — Ambulatory Visit (INDEPENDENT_AMBULATORY_CARE_PROVIDER_SITE_OTHER): Payer: PPO

## 2021-01-14 ENCOUNTER — Other Ambulatory Visit: Payer: Self-pay

## 2021-01-14 DIAGNOSIS — Z952 Presence of prosthetic heart valve: Secondary | ICD-10-CM | POA: Diagnosis not present

## 2021-01-14 DIAGNOSIS — Z7901 Long term (current) use of anticoagulants: Secondary | ICD-10-CM

## 2021-01-14 LAB — POCT INR: INR: 4.5 — AB (ref 2.0–3.0)

## 2021-01-14 NOTE — Patient Instructions (Signed)
HOLD TONIGHT and Thursday NIGHT and then Continue same dose 1 tablet daily except 1.5 tablets each Wednesday.  Repeat INR in 2 weeks.  Eat greens tonight.  [phone]

## 2021-01-20 DIAGNOSIS — S82161A Torus fracture of upper end of right tibia, initial encounter for closed fracture: Secondary | ICD-10-CM | POA: Diagnosis not present

## 2021-01-29 ENCOUNTER — Other Ambulatory Visit: Payer: Self-pay

## 2021-01-29 ENCOUNTER — Ambulatory Visit (INDEPENDENT_AMBULATORY_CARE_PROVIDER_SITE_OTHER): Payer: PPO

## 2021-01-29 DIAGNOSIS — Z7901 Long term (current) use of anticoagulants: Secondary | ICD-10-CM

## 2021-01-29 DIAGNOSIS — Z952 Presence of prosthetic heart valve: Secondary | ICD-10-CM

## 2021-01-29 DIAGNOSIS — M25561 Pain in right knee: Secondary | ICD-10-CM | POA: Diagnosis not present

## 2021-01-29 LAB — POCT INR: INR: 2.4 (ref 2.0–3.0)

## 2021-01-29 NOTE — Patient Instructions (Signed)
Continue same dose 1 tablet daily except 1.5 tablets each Wednesday.  Repeat INR in 5 weeks.   [phone]

## 2021-02-15 ENCOUNTER — Other Ambulatory Visit: Payer: Self-pay | Admitting: Cardiovascular Disease

## 2021-02-24 DIAGNOSIS — I8390 Asymptomatic varicose veins of unspecified lower extremity: Secondary | ICD-10-CM | POA: Diagnosis not present

## 2021-02-24 DIAGNOSIS — M1711 Unilateral primary osteoarthritis, right knee: Secondary | ICD-10-CM | POA: Diagnosis not present

## 2021-03-04 ENCOUNTER — Ambulatory Visit: Payer: PPO | Admitting: Cardiovascular Disease

## 2021-03-04 ENCOUNTER — Encounter: Payer: Self-pay | Admitting: Cardiovascular Disease

## 2021-03-04 ENCOUNTER — Ambulatory Visit (INDEPENDENT_AMBULATORY_CARE_PROVIDER_SITE_OTHER): Payer: PPO

## 2021-03-04 ENCOUNTER — Other Ambulatory Visit: Payer: Self-pay

## 2021-03-04 VITALS — BP 130/76 | HR 60 | Ht 65.0 in | Wt 179.4 lb

## 2021-03-04 DIAGNOSIS — I471 Supraventricular tachycardia: Secondary | ICD-10-CM | POA: Diagnosis not present

## 2021-03-04 DIAGNOSIS — I10 Essential (primary) hypertension: Secondary | ICD-10-CM

## 2021-03-04 DIAGNOSIS — Z7901 Long term (current) use of anticoagulants: Secondary | ICD-10-CM

## 2021-03-04 DIAGNOSIS — E7800 Pure hypercholesterolemia, unspecified: Secondary | ICD-10-CM | POA: Diagnosis not present

## 2021-03-04 DIAGNOSIS — Z952 Presence of prosthetic heart valve: Secondary | ICD-10-CM | POA: Diagnosis not present

## 2021-03-04 DIAGNOSIS — I441 Atrioventricular block, second degree: Secondary | ICD-10-CM

## 2021-03-04 LAB — POCT INR: INR: 3 (ref 2.0–3.0)

## 2021-03-04 MED ORDER — METOPROLOL SUCCINATE ER 25 MG PO TB24: 12.5000 mg | ORAL_TABLET | Freq: Every day | ORAL | 3 refills | Status: DC

## 2021-03-04 NOTE — Progress Notes (Signed)
Cardiology office Note    Date:  03/06/2021   ID:  GRACIELA TICHY, DOB April 14, 1940, MRN [id number]  PCP:  Blair Heys, MD  Cardiologist:  Thurmon Fair, MD  Electrophysiologist:  None   Evaluation Performed:  Follow-Up Visit  Chief Complaint: Dizziness History of Present Illness:    Sharon Rose is a 81 y.o. female with a mechanical aortic valve prosthesis, hypertension and hyperlipidemia.  She has a history of bradycardia on higher doses of beta-blocker, worsening ankle edema on high-dose amlodipine and had syncope near syncope when she was taking diuretics on a daily basis.  She is no longer taking diuretics and has rare problems with lower extremity edema, especially well controlled if she is wearing compression stockings.  She did have an episode of vertigo that occurred after she had been laying backwards for dental appointment and try to sit up fast.  She is worried that she has poor circulation since her feet are always cold and she has leg pain at night.  She does not have intermittent claudication.  She tripped and had a fracture of the tibia near her knee joint.  This has led to worsening knee arthritis type discomfort.  She had COVID-19 last fall.  She has lost about 12 pounds in the last year.  She does not have problems with shortness of breath either at rest or with activity and denies orthopnea or PND.  She does not have chest pain at rest or with activity.  She has not had any focal neurological events or bleeding problems, even when she had her fall.  Her blood pressure control is fair on the combination of maximum dose losartan and low-dose metoprolol and amlodipine.  Her echocardiogram 07/23/2019 showed normal left ventricular systolic function and normal function of her aortic valve prosthesis. A 30-day extended arrhythmia monitor in June-July showed the same previously detected episodes of brief paroxysmal atrial tachycardia, but no atrial fibrillation, no  meaningful bradycardia and no ventricular arrhythmia.  In the past she has had issues of bradycardia with beta-blockers.  Mrs. Heming  had replacement of a severely stenotic, probably congenitally bicuspid aortic valve in 2006 with a 23 mm mechanical valve. She has HTN and dyslipidemia that is treated with a statin. Coronary angiography before surgery showed total occlusion of the mid second oblique marginal artery by preoperative coronary angiography in 2006, but no angina and no evidence of ischemia by nuclear stress testing. Repeat coronary angiography in March 2011 did not confirm occlusion of the second oblique marginal branch. She has normal LV function and no history of embolic events. In 2014 she presented with thiazide induced hypokalemia and bradycardia. Her heart rate normalized after electrolyte correction. We tried to stop her diuretic completely, but this was associated with ankle swelling and difficult to control blood pressure. She has a history of postoperative atrial fibrillation without detected recurrence since then. Normal 30 day event monitor in July 2017.   Past Medical History:  Diagnosis Date   CAD (coronary artery disease)    NUCLEAR STRESS TEST, 10/07/2008 - no ischemia   Dyspnea on exertion    H/O aortic valve replacement    2D ECHO, 04/26/2011 - EF 50-55%, normal-mild   Hypertension    NSTEMI (non-ST elevated myocardial infarction) (HCC) 12/2004   Secondary to occlusion of OM 2, small vessel and medical therapy recommended; cath 2011 with no CAD   Past Surgical History:  Procedure Laterality Date   AORTIC VALVE REPLACEMENT  09/12/2004  St. Jude mechanical aortic valve, model #23AK-501, serial [id number]   CARDIAC CATHETERIZATION Left 03/25/2009   Normal cors   CARDIAC CATHETERIZATION  01/20/2005   Medical management for occluded and possibly revascularized OM 2, string sign seen   CARDIAC CATHETERIZATION  08/11/2004   Elective aortic valve replacement, severe aortic  valvre stenosis with functional class II to III symptomatology     Current Meds  Medication Sig   amLODipine (NORVASC) 2.5 MG tablet TAKE 1 TABLET BY MOUTH EVERY DAY   aspirin EC 81 MG tablet Take 81 mg by mouth daily.   calcium carbonate (OS-CAL) 600 MG TABS Take 600 mg by mouth 2 (two) times daily with a meal.   losartan (COZAAR) 100 MG tablet TAKE 1 TABLET BY MOUTH EVERY DAY (STOP TAKING THE LOSARTAN-HCTZ COMBO TAB)   Multiple Vitamin (MULTIVITAMIN) tablet Take 1 tablet by mouth daily.   omeprazole (PRILOSEC) 20 MG capsule Take 20 mg by mouth daily.   rosuvastatin (CRESTOR) 5 MG tablet Take 5 mg by mouth every evening.    warfarin (COUMADIN) 5 MG tablet TAKE 1 TO 1&1/2 TABLETS BY MOUTH EVERY DAY AS DIRECTED BY COUMADIN CLINIC   [DISCONTINUED] metoprolol succinate (TOPROL-XL) 25 MG 24 hr tablet TAKE 1 TABLET BY MOUTH DAILY. TAKE WITH OR IMMEDIATELY FOLLOWING A MEAL.     Allergies:   Codeine, Statins, Sulfa antibiotics, and Verapamil   Social History   Tobacco Use   Smoking status: Never   Smokeless tobacco: Never  Substance Use Topics   Alcohol use: No   Drug use: No     Family Hx: The patient's family history is not on file.  ROS:   Please see the history of present illness.   All other systems are reviewed and are negative.  Prior CV studies:    Labs/Other Tests and Data Reviewed:    ECHO 07/23/2019:  1. Left ventricular ejection fraction, by estimation, is 50 to 55%. The left ventricle has low normal function. The left ventricle has no regional wall motion abnormalities. There is mild  concentric left ventricular hypertrophy. Left ventricular diastolic parameters are consistent with Grade I diastolic dysfunction (impaired relaxation). 2. Right ventricular systolic function is low normal. The right ventricular size is normal. There is normal pulmonary artery systolic pressure. The estimated right ventricular systolic pressure is 23.1 mmHg. 3. The mitral valve is grossly  normal. Trivial mitral valve regurgitation. No evidence of mitral stenosis. 4. 23 mm mechanical AoV. V max 2.56 m/s, MG 12 mmHG, EOA 1.09 cm2, DI 0.32. Washing jets observed. Normal functioning prosthesis. The aortic valve has been repaired/replaced. Aortic valve regurgitation is not visualized. There is a 23 mm St. Jude mechanical valve present in the aortic position. Procedure Date: 09/12/2004. 5. The inferior vena cava is normal in size with greater than 50% respiratory variability, suggesting right atrial pressure of 3 mmHg. Comparison(s): No significant change from prior study.  Event monitor 06/16-07/19/2021  Predominant rhythm is normal sinus rhythm with normal circadian variation. There are very frequent premature atrial complexes, frequent atrial couplets and frequent episodes of brief nonsustained ectopic atrial tachycardia True atrial fibrillation is not recorded. There are no episodes of significant bradycardia and there are rare isolated PVCs. A single 5 beat nocturnal run of accelerated idioventricular rhythm is seen.   Abnormal event monitor due to very frequent episodes of brief nonsustained ectopic atrial tachycardia.   No atrial fibrillation is seen.  No meaningful bradycardia or ventricular arrhythmia is seen.   EKG: Ordered today  and personally reviewed shows sinus rhythm with PACs and a probable second-degree AV block Mobitz type I.  She has left axis deviation almost meeting criteria for left anterior fascicular block Recent Labs: No results found for requested labs within last 8760 hours.  12/22/2020 Creatinine 0.76, hemoglobin 13.3, potassium 3.8, ALT 12, hemoglobin A1c 5.6% Recent Lipid Panel Lab Results  Component Value Date/Time   CHOL 190 07/08/2014 10:53 AM   TRIG 188 (H) 07/08/2014 10:53 AM   HDL 46 07/08/2014 10:53 AM   CHOLHDL 4.1 07/08/2014 10:53 AM   LDLCALC 106 (H) 07/08/2014 10:53 AM   12/22/2020 Cholesterol 135, HDL 46, LDL 67, triglycerides  124  Wt Readings from Last 3 Encounters:  03/04/21 179 lb 6.4 oz (81.4 kg)  02/27/20 191 lb (86.6 kg)  12/05/19 181 lb (82.1 kg)     Objective:    Vital Signs:  BP 130/76    Pulse 60    Ht 5\' 5"  (1.651 m)    Wt 179 lb 6.4 oz (81.4 kg)    SpO2 99%    BMI 29.85 kg/m     General: Alert, oriented x3, no distress, overweight Head: no evidence of trauma, PERRL, EOMI, no exophtalmos or lid lag, no myxedema, no xanthelasma; normal ears, nose and oropharynx Neck: normal jugular venous pulsations and no hepatojugular reflux; brisk carotid pulses without delay and no carotid bruits Chest: clear to auscultation, no signs of consolidation by percussion or palpation, normal fremitus, symmetrical and full respiratory excursions Cardiovascular: normal position and quality of the apical impulse, regular rhythm, crisp prosthetic valve clicks, 1/6 early peaking aortic ejection murmur, no diastolic murmurs, rubs or gallops Abdomen: no tenderness or distention, no masses by palpation, no abnormal pulsatility or arterial bruits, normal bowel sounds, no hepatosplenomegaly Extremities: no clubbing, cyanosis or edema; 2+ radial, ulnar and brachial pulses bilaterally; 2+ right femoral, posterior tibial and dorsalis pedis pulses; 2+ left femoral, posterior tibial and dorsalis pedis pulses; no subclavian or femoral bruits.  Her pedal pulses are strong even through the compression stockings. Neurological: grossly nonfocal Psych: Normal mood and affect    ASSESSMENT & PLAN:    1. Second degree atrioventricular block, Mobitz (type) I   2. PAT (paroxysmal atrial tachycardia) (HCC)   3. H/O aortic valve replacement   4. Long term current use of anticoagulant   5. Essential hypertension   6. Hypercholesterolemia       2nd deg AVB, MT1: Has had problems with long AV conduction times for a while and now has evidence of Wenckebach cycles..  We will reduce the dose of metoprolol succinate and may have to stop it  altogether in the future.  Unfortunately its been useful to help with blood pressure control and other medications have caused a variety of side effects.  May eventually need pacemaker implantation PAT: This has been reported on monitors, but has never been symptomatic.  Avoiding high doses of beta-blockers or true antiarrhythmics since we are trying to avoid pacemaker implantation. Mechanical AVR: Valve function clinically, scheduled for echocardiogram later this month. Warfarin anticoagulation: INR was briefly supratherapeutic in December, now in target range.  Has had 1 fall, this is not a common event.  No serious bleeding issues. HTN: Acceptable blood pressure control but we have to cut back on her beta-blocker.  She did not tolerate higher doses of amlodipine due to edema and had orthostatic hypotension causing syncope when taking diuretics. HLP: On statin with a lipid parameters in target range.   Patient  Instructions  Medication Instructions:  DECREASE the Metoprolol Succinate to 12.5 mg once daily (half a tablet)  *If you need a refill on your cardiac medications before your next appointment, please call your pharmacy*   Lab Work: None ordered If you have labs (blood work) drawn today and your tests are completely normal, you will receive your results only by: MyChart Message (if you have MyChart) OR A paper copy in the mail If you have any lab test that is abnormal or we need to change your treatment, we will call you to review the results.   Testing/Procedures: Your physician has requested that you have an echocardiogram. Echocardiography is a painless test that uses sound waves to create images of your heart. It provides your doctor with information about the size and shape of your heart and how well your hearts chambers and valves are working. You may receive an ultrasound enhancing agent through an IV if needed to better visualize your heart during the echo.This procedure takes  approximately one hour. There are no restrictions for this procedure. This will take place at the [address].     Follow-Up: At [facility], you and your health needs are our priority.  As part of our continuing mission to provide you with exceptional heart care, we have created designated Provider Care Teams.  These Care Teams include your primary Cardiologist (physician) and Advanced Practice Providers (APPs -  Physician Assistants and Nurse Practitioners) who all work together to provide you with the care you need, when you need it.  We recommend signing up for the patient portal called "MyChart".  Sign up information is provided on this After Visit Summary.  MyChart is used to connect with patients for Virtual Visits (Telemedicine).  Patients are able to view lab/test results, encounter notes, upcoming appointments, etc.  Non-urgent messages can be sent to your provider as well.   To learn more about what you can do with MyChart, go to [email or web address].    Your next appointment:   12 month(s)  The format for your next appointment:   In Person  Provider:   Thurmon Fair, MD {     Signed, Thurmon Fair, MD  03/06/2021 8:18 AM    [facility]

## 2021-03-04 NOTE — Patient Instructions (Signed)
Continue same dose 1 tablet daily except 1.5 tablets each Wednesday.  Repeat INR in 6 weeks.   [phone]

## 2021-03-04 NOTE — Patient Instructions (Signed)
Medication Instructions:  DECREASE the Metoprolol Succinate to 12.5 mg once daily (half a tablet)  *If you need a refill on your cardiac medications before your next appointment, please call your pharmacy*   Lab Work: None ordered If you have labs (blood work) drawn today and your tests are completely normal, you will receive your results only by: [place] (if you have MyChart) OR A paper copy in the mail If you have any lab test that is abnormal or we need to change your treatment, we will call you to review the results.   Testing/Procedures: Your physician has requested that you have an echocardiogram. Echocardiography is a painless test that uses sound waves to create images of your heart. It provides your doctor with information about the size and shape of your heart and how well your hearts chambers and valves are working. You may receive an ultrasound enhancing agent through an IV if needed to better visualize your heart during the echo.This procedure takes approximately one hour. There are no restrictions for this procedure. This will take place at the [address], [address].     Follow-Up: At [facility], you and your health needs are our priority.  As part of our continuing mission to provide you with exceptional heart care, we have created designated Provider Care Teams.  These Care Teams include your primary Cardiologist (physician) and Advanced Practice Providers (APPs -  Physician Assistants and Nurse Practitioners) who all work together to provide you with the care you need, when you need it.  We recommend signing up for the patient portal called "MyChart".  Sign up information is provided on this After Visit Summary.  MyChart is used to connect with patients for Virtual Visits (Telemedicine).  Patients are able to view lab/test results, encounter notes, upcoming appointments, etc.  Non-urgent messages can be sent to your provider as well.   To learn more about  what you can do with MyChart, go to [email or web address].    Your next appointment:   12 month(s)  The format for your next appointment:   In Person  Provider:   Sanda Klein, MD {

## 2021-03-17 ENCOUNTER — Other Ambulatory Visit: Payer: Self-pay

## 2021-03-17 ENCOUNTER — Ambulatory Visit (HOSPITAL_COMMUNITY): Payer: PPO | Attending: Cardiovascular Disease

## 2021-03-17 DIAGNOSIS — Z952 Presence of prosthetic heart valve: Secondary | ICD-10-CM | POA: Diagnosis not present

## 2021-03-17 LAB — ECHOCARDIOGRAM COMPLETE
AR max vel: 1.26 cm2
AV Area VTI: 1.47 cm2
AV Area mean vel: 1.39 cm2
AV Mean grad: 14.4 mmHg
AV Peak grad: 27.9 mmHg
Ao pk vel: 2.64 m/s
Area-P 1/2: 3.63 cm2
P 1/2 time: 585 msec
S' Lateral: 3.8 cm

## 2021-03-30 ENCOUNTER — Other Ambulatory Visit: Payer: Self-pay | Admitting: Cardiovascular Disease

## 2021-04-10 ENCOUNTER — Other Ambulatory Visit: Payer: Self-pay | Admitting: Cardiovascular Disease

## 2021-04-13 ENCOUNTER — Ambulatory Visit (INDEPENDENT_AMBULATORY_CARE_PROVIDER_SITE_OTHER): Payer: PPO | Admitting: Vascular Surgery

## 2021-04-13 ENCOUNTER — Other Ambulatory Visit: Payer: Self-pay

## 2021-04-13 ENCOUNTER — Encounter (INDEPENDENT_AMBULATORY_CARE_PROVIDER_SITE_OTHER): Payer: Self-pay | Admitting: Vascular Surgery

## 2021-04-13 VITALS — BP 144/73 | HR 60 | Resp 16 | Ht 65.0 in | Wt 182.0 lb

## 2021-04-13 DIAGNOSIS — I872 Venous insufficiency (chronic) (peripheral): Secondary | ICD-10-CM | POA: Diagnosis not present

## 2021-04-13 DIAGNOSIS — M79605 Pain in left leg: Secondary | ICD-10-CM

## 2021-04-13 DIAGNOSIS — I10 Essential (primary) hypertension: Secondary | ICD-10-CM

## 2021-04-13 DIAGNOSIS — M79606 Pain in leg, unspecified: Secondary | ICD-10-CM | POA: Insufficient documentation

## 2021-04-13 DIAGNOSIS — I2510 Atherosclerotic heart disease of native coronary artery without angina pectoris: Secondary | ICD-10-CM | POA: Diagnosis not present

## 2021-04-13 DIAGNOSIS — M79604 Pain in right leg: Secondary | ICD-10-CM

## 2021-04-13 DIAGNOSIS — M5136 Other intervertebral disc degeneration, lumbar region: Secondary | ICD-10-CM

## 2021-04-13 DIAGNOSIS — I739 Peripheral vascular disease, unspecified: Secondary | ICD-10-CM | POA: Insufficient documentation

## 2021-04-13 DIAGNOSIS — E7800 Pure hypercholesterolemia, unspecified: Secondary | ICD-10-CM

## 2021-04-13 NOTE — Progress Notes (Signed)
changes   '[]'$ Weakness or numbness in arm   '[]'$ Weakness or numbness in leg ?Musculoskeletal:   '[]'$ Joint swelling   '[]'$ Joint pain   '[]'$ Low back pain ?Hematologic:  '[]'$ Easy bruising  '[]'$ Easy bleeding   '[]'$ Hypercoagulable state   '[]'$ Anemic ?Gastrointestinal:  '[]'$ Diarrhea   '[]'$ Vomiting  '[]'$ Gastroesophageal reflux/heartburn   '[]'$ Difficulty swallowing. ?Genitourinary:  '[]'$ Chronic kidney disease   '[]'$ Difficult urination  '[]'$ Frequent urination   '[]'$ Blood in urine ?Skin:  '[]'$ Rashes   '[]'$ Ulcers  ?Psychological:  '[]'$ History of anxiety   '[]'$  History of major depression. ? ?Physical Examination ? ?There were no vitals filed for this visit. ?There is no height or weight on  file to calculate BMI. ?Gen: WD/WN, NAD ?Head: [place]/AT, No temporalis wasting.  ?Ear/Nose/Throat: Hearing grossly intact, nares w/o erythema or drainage ?Eyes: PER, EOMI, sclera nonicteric.  ?Neck: Supple, no masses.  No bruit or JVD.  ?Pulmonary:  Good air movement, no audible wheezing, no use of accessory muscles.  ?Cardiac: RRR, normal S1, S2, no Murmurs. ?Vascular:  scattered varicosities present bilaterally.  Mild venous stasis changes to the legs bilaterally.  2+ soft pitting edema  ?Vessel Right Left  ?Radial Palpable Palpable  ?PT Palpable Palpable  ?DP Palpable Palpable  ?Gastrointestinal: soft, non-distended. No guarding/no peritoneal signs.  ?Musculoskeletal: M/S 5/5 throughout.  No visible deformity.  ?Neurologic: CN 2-12 intact. Pain and light touch intact in extremities.  Symmetrical.  Speech is fluent. Motor exam as listed above. ?Psychiatric: Judgment intact, Mood & affect appropriate for pt's clinical situation. ?Dermatologic: No rashes or ulcers noted.  No changes consistent with cellulitis. ? ? ?CBC ?Lab Results  ?Component Value Date  ? WBC 4.9 07/01/2019  ? HGB 13.8 07/01/2019  ? HCT 40.8 07/01/2019  ? MCV 93.8 07/01/2019  ? PLT 325 07/01/2019  ? ? ?BMET ?   ?Component Value Date/Time  ? NA 139 07/01/2019 1002  ? K 3.5 07/01/2019 1002  ? CL 98 07/01/2019 1002  ? CO2 26 07/01/2019 1002  ? GLUCOSE 109 (H) 07/01/2019 1002  ? BUN 15 07/01/2019 1002  ? CREATININE 0.85 07/01/2019 1002  ? CREATININE 0.59 07/08/2014 1053  ? CALCIUM 10.2 07/01/2019 1002  ? GFRNONAA >60 07/01/2019 1002  ? GFRAA >60 07/01/2019 1002  ? ?CrCl cannot be calculated (Patient's most recent lab result is older than the maximum 21 days allowed.). ? ?COAG ?Lab Results  ?Component Value Date  ? INR 3.0 03/04/2021  ? INR 2.4 01/29/2021  ? INR 4.5 (A) 01/14/2021  ? ? ?Radiology ?ECHOCARDIOGRAM COMPLETE ? ?Result Date: 03/17/2021 ?   ECHOCARDIOGRAM REPORT   Patient Name:   Sharon Rose [facility] Date of Exam: 03/17/2021 Medical Rec #:  [id number]       Height:       65.0 in Accession #:    [id number]     Weight:       179.4 lb Date of Birth:  [birth year]      BSA:          1.889 m? Patient Age:    81 years       BP:           130/76 mmHg Patient Gender: F              HR:           58 bpm. Exam Location:  [address] Procedure: 2D Echo, Cardiac Doppler and Color Doppler Indications:    Z95.2 AVR  History:        Patient has prior history  ? ? ?MRN : [id number] ? ?Sharon Rose is a 81 y.o. (1940/02/23) female who presents with chief complaint of check circulation. ? ?History of Present Illness:  ? ?The patient is seen for evaluation of painful lower extremities. She notes the pain is really more like an uncomfortable  dysthesia or numbness and a sensation of coldness.  Patient notes the pain is variable and not always associated with activity.  The pain is somewhat consistent day to day occurring on most days. The patient notes the pain also occurs with standing and routinely seems worse as the day wears on. The pain has been progressive over the past several years. The patient states these symptoms are causing  a profound negative impact on quality of life and daily activities. ? ?The patient denies rest pain or dangling of an extremity off the side of the bed during the night for relief. ?No open wounds or sores at this time. ?No history of DVT or phlebitis. ?No prior interventions or surgeries. ? ?There is an extensive  history of back problems and DJD of the lumbar and sacral spine.    ? ?No outpatient medications have been marked as taking for the 04/13/21 encounter (Appointment) with Delana Meyer, Dolores Lory, MD.  ? ? ?Past Medical History:  ?Diagnosis Date  ? CAD (coronary artery disease)   ? NUCLEAR STRESS TEST, 10/07/2008 - no ischemia  ? Dyspnea on exertion   ? H/O aortic valve replacement   ? 2D ECHO, 04/26/2011 - EF 50-55%, normal-mild  ? Hypertension   ? NSTEMI (non-ST elevated myocardial infarction) ([place]) 12/2004  ? Secondary to occlusion of OM 2, small vessel and medical therapy recommended; cath 2011 with no CAD  ? ? ?Past Surgical History:  ?Procedure Laterality Date  ? AORTIC VALVE REPLACEMENT  09/12/2004  ? [employer] Jude mechanical aortic valve, model #23AK-501, serial [id number]  ? CARDIAC CATHETERIZATION Left 03/25/2009  ? Normal cors  ? CARDIAC CATHETERIZATION  01/20/2005  ? Medical management for occluded and possibly revascularized OM 2,  string sign seen  ? CARDIAC CATHETERIZATION  08/11/2004  ? Elective aortic valve replacement, severe aortic valvre stenosis with functional class II to III symptomatology  ? ? ?Social History ?Social History  ? ?Tobacco Use  ? Smoking status: Never  ? Smokeless tobacco: Never  ?Substance Use Topics  ? Alcohol use: No  ? Drug use: No  ? ? ?Family History ?No family history on file. ? ?Allergies  ?Allergen Reactions  ? Codeine Nausea And Vomiting and Other (See Comments)  ?  GI upset, dizziness  ? Statins Other (See Comments)  ?  Joint and muscle pain  ? Sulfa Antibiotics Nausea And Vomiting and Other (See Comments)  ?  Gi upset, dizziness ?  ? Verapamil Other (See Comments)  ?  Constipation  ? ? ? ?REVIEW OF SYSTEMS (Negative unless checked) ? ?Constitutional: '[]'$ Weight loss  '[]'$ Fever  '[]'$ Chills ?Cardiac: '[]'$ Chest pain   '[]'$ Chest pressure   '[]'$ Palpitations   '[]'$ Shortness of breath when laying flat   '[]'$ Shortness of breath with exertion. ?Vascular:  '[]'$ Pain in legs with walking   '[x]'$ Pain in legs at rest  '[]'$ History of DVT   '[]'$ Phlebitis   '[]'$ Swelling in legs   '[]'$ Varicose veins   '[]'$ Non-healing ulcers ?Pulmonary:   '[]'$ Uses home oxygen   '[]'$ Productive cough   '[]'$ Hemoptysis   '[]'$ Wheeze  '[]'$ COPD   '[]'$ Asthma ?Neurologic:  '[]'$ Dizziness   '[]'$ Seizures   '[]'$ History of stroke   '[]'$ History of TIA  '[]'$ Aphasia   '[]'$ Vissual  ? ? ?MRN : [id number] ? ?Sharon Rose is a 81 y.o. (1940/02/23) female who presents with chief complaint of check circulation. ? ?History of Present Illness:  ? ?The patient is seen for evaluation of painful lower extremities. She notes the pain is really more like an uncomfortable  dysthesia or numbness and a sensation of coldness.  Patient notes the pain is variable and not always associated with activity.  The pain is somewhat consistent day to day occurring on most days. The patient notes the pain also occurs with standing and routinely seems worse as the day wears on. The pain has been progressive over the past several years. The patient states these symptoms are causing  a profound negative impact on quality of life and daily activities. ? ?The patient denies rest pain or dangling of an extremity off the side of the bed during the night for relief. ?No open wounds or sores at this time. ?No history of DVT or phlebitis. ?No prior interventions or surgeries. ? ?There is an extensive  history of back problems and DJD of the lumbar and sacral spine.    ? ?No outpatient medications have been marked as taking for the 04/13/21 encounter (Appointment) with Delana Meyer, Dolores Lory, MD.  ? ? ?Past Medical History:  ?Diagnosis Date  ? CAD (coronary artery disease)   ? NUCLEAR STRESS TEST, 10/07/2008 - no ischemia  ? Dyspnea on exertion   ? H/O aortic valve replacement   ? 2D ECHO, 04/26/2011 - EF 50-55%, normal-mild  ? Hypertension   ? NSTEMI (non-ST elevated myocardial infarction) ([place]) 12/2004  ? Secondary to occlusion of OM 2, small vessel and medical therapy recommended; cath 2011 with no CAD  ? ? ?Past Surgical History:  ?Procedure Laterality Date  ? AORTIC VALVE REPLACEMENT  09/12/2004  ? [employer]. Jude mechanical aortic valve, model #23AK-501, serial [id number]  ? CARDIAC CATHETERIZATION Left 03/25/2009  ? Normal cors  ? CARDIAC CATHETERIZATION  01/20/2005  ? Medical management for occluded and possibly revascularized OM 2,  string sign seen  ? CARDIAC CATHETERIZATION  08/11/2004  ? Elective aortic valve replacement, severe aortic valvre stenosis with functional class II to III symptomatology  ? ? ?Social History ?Social History  ? ?Tobacco Use  ? Smoking status: Never  ? Smokeless tobacco: Never  ?Substance Use Topics  ? Alcohol use: No  ? Drug use: No  ? ? ?Family History ?No family history on file. ? ?Allergies  ?Allergen Reactions  ? Codeine Nausea And Vomiting and Other (See Comments)  ?  GI upset, dizziness  ? Statins Other (See Comments)  ?  Joint and muscle pain  ? Sulfa Antibiotics Nausea And Vomiting and Other (See Comments)  ?  Gi upset, dizziness ?  ? Verapamil Other (See Comments)  ?  Constipation  ? ? ? ?REVIEW OF SYSTEMS (Negative unless checked) ? ?Constitutional: '[]'$ Weight loss  '[]'$ Fever  '[]'$ Chills ?Cardiac: '[]'$ Chest pain   '[]'$ Chest pressure   '[]'$ Palpitations   '[]'$ Shortness of breath when laying flat   '[]'$ Shortness of breath with exertion. ?Vascular:  '[]'$ Pain in legs with walking   '[x]'$ Pain in legs at rest  '[]'$ History of DVT   '[]'$ Phlebitis   '[]'$ Swelling in legs   '[]'$ Varicose veins   '[]'$ Non-healing ulcers ?Pulmonary:   '[]'$ Uses home oxygen   '[]'$ Productive cough   '[]'$ Hemoptysis   '[]'$ Wheeze  '[]'$ COPD   '[]'$ Asthma ?Neurologic:  '[]'$ Dizziness   '[]'$ Seizures   '[]'$ History of stroke   '[]'$ History of TIA  '[]'$ Aphasia   '[]'$ Vissual

## 2021-04-15 ENCOUNTER — Other Ambulatory Visit: Payer: Self-pay

## 2021-04-15 ENCOUNTER — Ambulatory Visit (INDEPENDENT_AMBULATORY_CARE_PROVIDER_SITE_OTHER): Payer: PPO

## 2021-04-15 DIAGNOSIS — Z952 Presence of prosthetic heart valve: Secondary | ICD-10-CM | POA: Diagnosis not present

## 2021-04-15 DIAGNOSIS — Z7901 Long term (current) use of anticoagulants: Secondary | ICD-10-CM | POA: Diagnosis not present

## 2021-04-15 LAB — POCT INR: INR: 2.6 (ref 2.0–3.0)

## 2021-04-15 NOTE — Patient Instructions (Signed)
Continue same dose 1 tablet daily except 1.5 tablets each Wednesday.  Repeat INR in 6 weeks.   [phone] ?

## 2021-05-17 ENCOUNTER — Other Ambulatory Visit: Payer: Self-pay | Admitting: Cardiovascular Disease

## 2021-05-28 ENCOUNTER — Ambulatory Visit (INDEPENDENT_AMBULATORY_CARE_PROVIDER_SITE_OTHER): Payer: PPO

## 2021-05-28 DIAGNOSIS — Z952 Presence of prosthetic heart valve: Secondary | ICD-10-CM | POA: Diagnosis not present

## 2021-05-28 DIAGNOSIS — Z7901 Long term (current) use of anticoagulants: Secondary | ICD-10-CM | POA: Diagnosis not present

## 2021-05-28 LAB — POCT INR: INR: 2.9 (ref 2.0–3.0)

## 2021-05-28 NOTE — Patient Instructions (Signed)
Continue same dose 1 tablet daily except 1.5 tablets each Wednesday.  Repeat INR in 6 weeks.   [phone] ?

## 2021-07-01 ENCOUNTER — Other Ambulatory Visit: Payer: Self-pay | Admitting: Cardiovascular Disease

## 2021-07-01 DIAGNOSIS — Z7901 Long term (current) use of anticoagulants: Secondary | ICD-10-CM

## 2021-07-01 NOTE — Telephone Encounter (Signed)
Prescription refill request received for warfarin [place]: 03/04/21 ([place])  Next INR check: 07/09/21 Warfarin tablet strength: '5mg'$   Appropriate dose and refill sent to requested pharmacy.

## 2021-07-06 DIAGNOSIS — Z86007 Personal history of in-situ neoplasm of skin: Secondary | ICD-10-CM | POA: Diagnosis not present

## 2021-07-06 DIAGNOSIS — Z85828 Personal history of other malignant neoplasm of skin: Secondary | ICD-10-CM | POA: Diagnosis not present

## 2021-07-06 DIAGNOSIS — L814 Other melanin hyperpigmentation: Secondary | ICD-10-CM | POA: Diagnosis not present

## 2021-07-06 DIAGNOSIS — D225 Melanocytic nevi of trunk: Secondary | ICD-10-CM | POA: Diagnosis not present

## 2021-07-06 DIAGNOSIS — Z08 Encounter for follow-up examination after completed treatment for malignant neoplasm: Secondary | ICD-10-CM | POA: Diagnosis not present

## 2021-07-06 DIAGNOSIS — L821 Other seborrheic keratosis: Secondary | ICD-10-CM | POA: Diagnosis not present

## 2021-07-06 DIAGNOSIS — L304 Erythema intertrigo: Secondary | ICD-10-CM | POA: Diagnosis not present

## 2021-07-09 ENCOUNTER — Ambulatory Visit (INDEPENDENT_AMBULATORY_CARE_PROVIDER_SITE_OTHER): Payer: PPO

## 2021-07-09 DIAGNOSIS — Z952 Presence of prosthetic heart valve: Secondary | ICD-10-CM

## 2021-07-09 DIAGNOSIS — Z7901 Long term (current) use of anticoagulants: Secondary | ICD-10-CM | POA: Diagnosis not present

## 2021-07-09 LAB — POCT INR: INR: 3.2 — AB (ref 2.0–3.0)

## 2021-07-09 NOTE — Patient Instructions (Signed)
Description   Eat greens today and continue same dose 1 tablet daily except 1.5 tablets each Wednesday.   Repeat INR in 6 weeks.   [facility] [phone]

## 2021-08-20 ENCOUNTER — Ambulatory Visit (INDEPENDENT_AMBULATORY_CARE_PROVIDER_SITE_OTHER): Payer: PPO

## 2021-08-20 DIAGNOSIS — Z7901 Long term (current) use of anticoagulants: Secondary | ICD-10-CM | POA: Diagnosis not present

## 2021-08-20 DIAGNOSIS — Z952 Presence of prosthetic heart valve: Secondary | ICD-10-CM | POA: Diagnosis not present

## 2021-08-20 LAB — POCT INR: INR: 2.5 (ref 2.0–3.0)

## 2021-08-20 NOTE — Patient Instructions (Signed)
Description   Continue same dose 1 tablet daily except 1.5 tablets each Wednesday.   Repeat INR in 7 weeks.   [facility] [phone]

## 2021-09-14 DIAGNOSIS — R7303 Prediabetes: Secondary | ICD-10-CM | POA: Diagnosis not present

## 2021-09-14 DIAGNOSIS — I700 Atherosclerosis of aorta: Secondary | ICD-10-CM | POA: Diagnosis not present

## 2021-09-14 DIAGNOSIS — I10 Essential (primary) hypertension: Secondary | ICD-10-CM | POA: Diagnosis not present

## 2021-09-14 DIAGNOSIS — K2100 Gastro-esophageal reflux disease with esophagitis, without bleeding: Secondary | ICD-10-CM | POA: Diagnosis not present

## 2021-09-14 DIAGNOSIS — I208 Other forms of angina pectoris: Secondary | ICD-10-CM | POA: Diagnosis not present

## 2021-09-14 DIAGNOSIS — Z0000 Encounter for general adult medical examination without abnormal findings: Secondary | ICD-10-CM | POA: Diagnosis not present

## 2021-09-14 DIAGNOSIS — I359 Nonrheumatic aortic valve disorder, unspecified: Secondary | ICD-10-CM | POA: Diagnosis not present

## 2021-09-14 DIAGNOSIS — R0600 Dyspnea, unspecified: Secondary | ICD-10-CM | POA: Diagnosis not present

## 2021-09-14 DIAGNOSIS — E7800 Pure hypercholesterolemia, unspecified: Secondary | ICD-10-CM | POA: Diagnosis not present

## 2021-09-14 DIAGNOSIS — Z1331 Encounter for screening for depression: Secondary | ICD-10-CM | POA: Diagnosis not present

## 2021-09-14 DIAGNOSIS — I2510 Atherosclerotic heart disease of native coronary artery without angina pectoris: Secondary | ICD-10-CM | POA: Diagnosis not present

## 2021-09-14 NOTE — Progress Notes (Unsigned)
Cardiology Office Note:    Date:  09/15/2021   ID:  CAYLE CORDOBA, DOB 1940/10/20, MRN [id number]  PCP:  Gaynelle Arabian, MD   [facility] Providers Cardiologist:  Sanda Klein, MD     Referring MD: Gaynelle Arabian, MD   Chief Complaint: irregular heart rate  History of Present Illness:    Sharon Rose is a very pleasant 81 y.o. female with a hx of severe AS s/p mechanical aortic valve prosthesis, HTN, and HLD.  She had replacement of a severely stenotic probably congenitally bicuspid aortic valve in 2006 with a 23 mm mechanical valve.  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  Repeat coronary angiography in March 2011 did not confirm occlusion of the second oblique marginal branch.  She had normal LV function and no history of embolic events.  2014 she presented with thiazide induced hypokalemia and bradycardia.  Her heart rate normalized after electrolyte correction.  We tried to stop diuretic completely but this was associated with ankle swelling and difficult to control blood pressure.  History of postoperative atrial fibrillation without detected recurrence since that time. Normal 30-day event monitor July 2017.  History of bradycardia on higher doses of beta-blocker, worsening ankle edema on high-dose amlodipine and syncope, near syncope when she was taking diuretics on a daily basis.  No longer takes diuretics on a daily basis and LE edema especially well controlled if she was wearing compression stockings.  Was last seen in our office on 03/04/2021 by Dr. Sallyanne Kuster at which time EKG showed second-degree AV block with notation of problems with long AV conduction times for awhile and now evidence of Wenckebach cycles.  Metoprolol dose was reduced with consideration of discontinuing in the future. Concern that this will increase her difficult to control  hypertension. Advised to return in 12 months for follow-up.   Echocardiogram 03/17/2021 revealed LVEF 50 to 55%, no RWMA, mildly dilated LV, mild LVH, normal diastolic parameters, normal RV prosthetic aortic valve functioning normally.  Dr. Sallyanne Kuster noted that the veins draining into the heart are not dilated, which generally suggest that the cause of leg swelling is not heart related.  Today, she is here per request of PCP for irregular heart rhythm on exam. Was seen by Dr. Marisue Humble on 8/21 for regular physical and reported to him that she is having worsening dyspnea on exertion.  He noted an irregularity with her heart on physical exam. Reports she does not feel well, does not have as much energy as she had in the past and simple household tasks such as washing dishes wears her out. She is a widow and lives alone. Walks end to end in her house because she cannot walk up an incline and does not feel safe walking outside. Symptoms were present at last office visit in 02/2021 with Dr. Sallyanne Kuster but she was hopeful that decrease in metoprolol dose would help her feel better, no improvement. Lying flat and sitting up causes her dizziness as well as occasional dizziness when she lies on her left side. 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 Has had syncope in the past but no recent presyncope or syncope. Occasionally feels some chest pressure.  No palpitations or tachycardia.  No orthopnea, PND, edema. No bleeding concerns on coumadin. INR has been therapeutic. BP at PCP office yesterday was 130/72.  Cardiology Office Note:    Date:  09/15/2021   ID:  Sharon Rose, DOB 1940/03/06, MRN [id number]  PCP:  Gaynelle Arabian, MD   [facility] Providers Cardiologist:  Sanda Klein, MD     Referring MD: Gaynelle Arabian, MD   Chief Complaint: irregular heart rate  History of Present Illness:    Sharon Rose is a very pleasant 81 y.o. female with a hx of severe AS s/p mechanical aortic valve prosthesis, HTN, and HLD.  She had replacement of a severely stenotic probably congenitally bicuspid aortic valve in 2006 with a 23 mm mechanical valve.  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  Repeat coronary angiography in March 2011 did not confirm occlusion of the second oblique marginal branch.  She had normal LV function and no history of embolic events.  2014 she presented with thiazide induced hypokalemia and bradycardia.  Her heart rate normalized after electrolyte correction.  We tried to stop diuretic completely but this was associated with ankle swelling and difficult to control blood pressure.  History of postoperative atrial fibrillation without detected recurrence since that time. Normal 30-day event monitor July 2017.  History of bradycardia on higher doses of beta-blocker, worsening ankle edema on high-dose amlodipine and syncope, near syncope when she was taking diuretics on a daily basis.  No longer takes diuretics on a daily basis and LE edema especially well controlled if she was wearing compression stockings.  Was last seen in our office on 03/04/2021 by Dr. Sallyanne Kuster at which time EKG showed second-degree AV block with notation of problems with long AV conduction times for awhile and now evidence of Wenckebach cycles.  Metoprolol dose was reduced with consideration of discontinuing in the future. Concern that this will increase her difficult to control  hypertension. Advised to return in 12 months for follow-up.   Echocardiogram 03/17/2021 revealed LVEF 50 to 55%, no RWMA, mildly dilated LV, mild LVH, normal diastolic parameters, normal RV prosthetic aortic valve functioning normally.  Dr. Sallyanne Kuster noted that the veins draining into the heart are not dilated, which generally suggest that the cause of leg swelling is not heart related.  Today, she is here per request of PCP for irregular heart rhythm on exam. Was seen by Dr. Marisue Humble on 8/21 for regular physical and reported to him that she is having worsening dyspnea on exertion.  He noted an irregularity with her heart on physical exam. Reports she does not feel well, does not have as much energy as she had in the past and simple household tasks such as washing dishes wears her out. She is a widow and lives alone. Walks end to end in her house because she cannot walk up an incline and does not feel safe walking outside. Symptoms were present at last office visit in 02/2021 with Dr. Sallyanne Kuster but she was hopeful that decrease in metoprolol dose would help her feel better, no improvement. Lying flat and sitting up causes her dizziness as well as occasional dizziness when she lies on her left side. 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 Has had syncope in the past but no recent presyncope or syncope. Occasionally feels some chest pressure.  No palpitations or tachycardia.  No orthopnea, PND, edema. No bleeding concerns on coumadin. INR has been therapeutic. BP at PCP office yesterday was 130/72.  Cardiology Office Note:    Date:  09/15/2021   ID:  CAYLE CORDOBA, DOB 1940/10/20, MRN [id number]  PCP:  Gaynelle Arabian, MD   [facility] Providers Cardiologist:  Sanda Klein, MD     Referring MD: Gaynelle Arabian, MD   Chief Complaint: irregular heart rate  History of Present Illness:    Sharon Rose is a very pleasant 81 y.o. female with a hx of severe AS s/p mechanical aortic valve prosthesis, HTN, and HLD.  She had replacement of a severely stenotic probably congenitally bicuspid aortic valve in 2006 with a 23 mm mechanical valve.  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  Repeat coronary angiography in March 2011 did not confirm occlusion of the second oblique marginal branch.  She had normal LV function and no history of embolic events.  2014 she presented with thiazide induced hypokalemia and bradycardia.  Her heart rate normalized after electrolyte correction.  We tried to stop diuretic completely but this was associated with ankle swelling and difficult to control blood pressure.  History of postoperative atrial fibrillation without detected recurrence since that time. Normal 30-day event monitor July 2017.  History of bradycardia on higher doses of beta-blocker, worsening ankle edema on high-dose amlodipine and syncope, near syncope when she was taking diuretics on a daily basis.  No longer takes diuretics on a daily basis and LE edema especially well controlled if she was wearing compression stockings.  Was last seen in our office on 03/04/2021 by Dr. Sallyanne Kuster at which time EKG showed second-degree AV block with notation of problems with long AV conduction times for awhile and now evidence of Wenckebach cycles.  Metoprolol dose was reduced with consideration of discontinuing in the future. Concern that this will increase her difficult to control  hypertension. Advised to return in 12 months for follow-up.   Echocardiogram 03/17/2021 revealed LVEF 50 to 55%, no RWMA, mildly dilated LV, mild LVH, normal diastolic parameters, normal RV prosthetic aortic valve functioning normally.  Dr. Sallyanne Kuster noted that the veins draining into the heart are not dilated, which generally suggest that the cause of leg swelling is not heart related.  Today, she is here per request of PCP for irregular heart rhythm on exam. Was seen by Dr. Marisue Humble on 8/21 for regular physical and reported to him that she is having worsening dyspnea on exertion.  He noted an irregularity with her heart on physical exam. Reports she does not feel well, does not have as much energy as she had in the past and simple household tasks such as washing dishes wears her out. She is a widow and lives alone. Walks end to end in her house because she cannot walk up an incline and does not feel safe walking outside. Symptoms were present at last office visit in 02/2021 with Dr. Sallyanne Kuster but she was hopeful that decrease in metoprolol dose would help her feel better, no improvement. Lying flat and sitting up causes her dizziness as well as occasional dizziness when she lies on her left side. 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 Has had syncope in the past but no recent presyncope or syncope. Occasionally feels some chest pressure.  No palpitations or tachycardia.  No orthopnea, PND, edema. No bleeding concerns on coumadin. INR has been therapeutic. BP at PCP office yesterday was 130/72.  Cardiology Office Note:    Date:  09/15/2021   ID:  CAYLE CORDOBA, DOB 1940/10/20, MRN [id number]  PCP:  Gaynelle Arabian, MD   [facility] Providers Cardiologist:  Sanda Klein, MD     Referring MD: Gaynelle Arabian, MD   Chief Complaint: irregular heart rate  History of Present Illness:    Sharon Rose is a very pleasant 81 y.o. female with a hx of severe AS s/p mechanical aortic valve prosthesis, HTN, and HLD.  She had replacement of a severely stenotic probably congenitally bicuspid aortic valve in 2006 with a 23 mm mechanical valve.  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  Repeat coronary angiography in March 2011 did not confirm occlusion of the second oblique marginal branch.  She had normal LV function and no history of embolic events.  2014 she presented with thiazide induced hypokalemia and bradycardia.  Her heart rate normalized after electrolyte correction.  We tried to stop diuretic completely but this was associated with ankle swelling and difficult to control blood pressure.  History of postoperative atrial fibrillation without detected recurrence since that time. Normal 30-day event monitor July 2017.  History of bradycardia on higher doses of beta-blocker, worsening ankle edema on high-dose amlodipine and syncope, near syncope when she was taking diuretics on a daily basis.  No longer takes diuretics on a daily basis and LE edema especially well controlled if she was wearing compression stockings.  Was last seen in our office on 03/04/2021 by Dr. Sallyanne Kuster at which time EKG showed second-degree AV block with notation of problems with long AV conduction times for awhile and now evidence of Wenckebach cycles.  Metoprolol dose was reduced with consideration of discontinuing in the future. Concern that this will increase her difficult to control  hypertension. Advised to return in 12 months for follow-up.   Echocardiogram 03/17/2021 revealed LVEF 50 to 55%, no RWMA, mildly dilated LV, mild LVH, normal diastolic parameters, normal RV prosthetic aortic valve functioning normally.  Dr. Sallyanne Kuster noted that the veins draining into the heart are not dilated, which generally suggest that the cause of leg swelling is not heart related.  Today, she is here per request of PCP for irregular heart rhythm on exam. Was seen by Dr. Marisue Humble on 8/21 for regular physical and reported to him that she is having worsening dyspnea on exertion.  He noted an irregularity with her heart on physical exam. Reports she does not feel well, does not have as much energy as she had in the past and simple household tasks such as washing dishes wears her out. She is a widow and lives alone. Walks end to end in her house because she cannot walk up an incline and does not feel safe walking outside. Symptoms were present at last office visit in 02/2021 with Dr. Sallyanne Kuster but she was hopeful that decrease in metoprolol dose would help her feel better, no improvement. Lying flat and sitting up causes her dizziness as well as occasional dizziness when she lies on her left side. 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 Has had syncope in the past but no recent presyncope or syncope. Occasionally feels some chest pressure.  No palpitations or tachycardia.  No orthopnea, PND, edema. No bleeding concerns on coumadin. INR has been therapeutic. BP at PCP office yesterday was 130/72.  Cardiology Office Note:    Date:  09/15/2021   ID:  Sharon Rose, DOB 1940/03/06, MRN [id number]  PCP:  Gaynelle Arabian, MD   [facility] Providers Cardiologist:  Sanda Klein, MD     Referring MD: Gaynelle Arabian, MD   Chief Complaint: irregular heart rate  History of Present Illness:    Sharon Rose is a very pleasant 81 y.o. female with a hx of severe AS s/p mechanical aortic valve prosthesis, HTN, and HLD.  She had replacement of a severely stenotic probably congenitally bicuspid aortic valve in 2006 with a 23 mm mechanical valve.  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  Repeat coronary angiography in March 2011 did not confirm occlusion of the second oblique marginal branch.  She had normal LV function and no history of embolic events.  2014 she presented with thiazide induced hypokalemia and bradycardia.  Her heart rate normalized after electrolyte correction.  We tried to stop diuretic completely but this was associated with ankle swelling and difficult to control blood pressure.  History of postoperative atrial fibrillation without detected recurrence since that time. Normal 30-day event monitor July 2017.  History of bradycardia on higher doses of beta-blocker, worsening ankle edema on high-dose amlodipine and syncope, near syncope when she was taking diuretics on a daily basis.  No longer takes diuretics on a daily basis and LE edema especially well controlled if she was wearing compression stockings.  Was last seen in our office on 03/04/2021 by Dr. Sallyanne Kuster at which time EKG showed second-degree AV block with notation of problems with long AV conduction times for awhile and now evidence of Wenckebach cycles.  Metoprolol dose was reduced with consideration of discontinuing in the future. Concern that this will increase her difficult to control  hypertension. Advised to return in 12 months for follow-up.   Echocardiogram 03/17/2021 revealed LVEF 50 to 55%, no RWMA, mildly dilated LV, mild LVH, normal diastolic parameters, normal RV prosthetic aortic valve functioning normally.  Dr. Sallyanne Kuster noted that the veins draining into the heart are not dilated, which generally suggest that the cause of leg swelling is not heart related.  Today, she is here per request of PCP for irregular heart rhythm on exam. Was seen by Dr. Marisue Humble on 8/21 for regular physical and reported to him that she is having worsening dyspnea on exertion.  He noted an irregularity with her heart on physical exam. Reports she does not feel well, does not have as much energy as she had in the past and simple household tasks such as washing dishes wears her out. She is a widow and lives alone. Walks end to end in her house because she cannot walk up an incline and does not feel safe walking outside. Symptoms were present at last office visit in 02/2021 with Dr. Sallyanne Kuster but she was hopeful that decrease in metoprolol dose would help her feel better, no improvement. Lying flat and sitting up causes her dizziness as well as occasional dizziness when she lies on her left side. 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 Has had syncope in the past but no recent presyncope or syncope. Occasionally feels some chest pressure.  No palpitations or tachycardia.  No orthopnea, PND, edema. No bleeding concerns on coumadin. INR has been therapeutic. BP at PCP office yesterday was 130/72.  Cardiology Office Note:    Date:  09/15/2021   ID:  CAYLE CORDOBA, DOB 1940/10/20, MRN [id number]  PCP:  Gaynelle Arabian, MD   [facility] Providers Cardiologist:  Sanda Klein, MD     Referring MD: Gaynelle Arabian, MD   Chief Complaint: irregular heart rate  History of Present Illness:    Sharon Rose is a very pleasant 81 y.o. female with a hx of severe AS s/p mechanical aortic valve prosthesis, HTN, and HLD.  She had replacement of a severely stenotic probably congenitally bicuspid aortic valve in 2006 with a 23 mm mechanical valve.  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  Repeat coronary angiography in March 2011 did not confirm occlusion of the second oblique marginal branch.  She had normal LV function and no history of embolic events.  2014 she presented with thiazide induced hypokalemia and bradycardia.  Her heart rate normalized after electrolyte correction.  We tried to stop diuretic completely but this was associated with ankle swelling and difficult to control blood pressure.  History of postoperative atrial fibrillation without detected recurrence since that time. Normal 30-day event monitor July 2017.  History of bradycardia on higher doses of beta-blocker, worsening ankle edema on high-dose amlodipine and syncope, near syncope when she was taking diuretics on a daily basis.  No longer takes diuretics on a daily basis and LE edema especially well controlled if she was wearing compression stockings.  Was last seen in our office on 03/04/2021 by Dr. Sallyanne Kuster at which time EKG showed second-degree AV block with notation of problems with long AV conduction times for awhile and now evidence of Wenckebach cycles.  Metoprolol dose was reduced with consideration of discontinuing in the future. Concern that this will increase her difficult to control  hypertension. Advised to return in 12 months for follow-up.   Echocardiogram 03/17/2021 revealed LVEF 50 to 55%, no RWMA, mildly dilated LV, mild LVH, normal diastolic parameters, normal RV prosthetic aortic valve functioning normally.  Dr. Sallyanne Kuster noted that the veins draining into the heart are not dilated, which generally suggest that the cause of leg swelling is not heart related.  Today, she is here per request of PCP for irregular heart rhythm on exam. Was seen by Dr. Marisue Humble on 8/21 for regular physical and reported to him that she is having worsening dyspnea on exertion.  He noted an irregularity with her heart on physical exam. Reports she does not feel well, does not have as much energy as she had in the past and simple household tasks such as washing dishes wears her out. She is a widow and lives alone. Walks end to end in her house because she cannot walk up an incline and does not feel safe walking outside. Symptoms were present at last office visit in 02/2021 with Dr. Sallyanne Kuster but she was hopeful that decrease in metoprolol dose would help her feel better, no improvement. Lying flat and sitting up causes her dizziness as well as occasional dizziness when she lies on her left side. 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 Has had syncope in the past but no recent presyncope or syncope. Occasionally feels some chest pressure.  No palpitations or tachycardia.  No orthopnea, PND, edema. No bleeding concerns on coumadin. INR has been therapeutic. BP at PCP office yesterday was 130/72.  Cardiology Office Note:    Date:  09/15/2021   ID:  CAYLE CORDOBA, DOB 1940/10/20, MRN [id number]  PCP:  Gaynelle Arabian, MD   [facility] Providers Cardiologist:  Sanda Klein, MD     Referring MD: Gaynelle Arabian, MD   Chief Complaint: irregular heart rate  History of Present Illness:    Sharon Rose is a very pleasant 81 y.o. female with a hx of severe AS s/p mechanical aortic valve prosthesis, HTN, and HLD.  She had replacement of a severely stenotic probably congenitally bicuspid aortic valve in 2006 with a 23 mm mechanical valve.  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  Repeat coronary angiography in March 2011 did not confirm occlusion of the second oblique marginal branch.  She had normal LV function and no history of embolic events.  2014 she presented with thiazide induced hypokalemia and bradycardia.  Her heart rate normalized after electrolyte correction.  We tried to stop diuretic completely but this was associated with ankle swelling and difficult to control blood pressure.  History of postoperative atrial fibrillation without detected recurrence since that time. Normal 30-day event monitor July 2017.  History of bradycardia on higher doses of beta-blocker, worsening ankle edema on high-dose amlodipine and syncope, near syncope when she was taking diuretics on a daily basis.  No longer takes diuretics on a daily basis and LE edema especially well controlled if she was wearing compression stockings.  Was last seen in our office on 03/04/2021 by Dr. Sallyanne Kuster at which time EKG showed second-degree AV block with notation of problems with long AV conduction times for awhile and now evidence of Wenckebach cycles.  Metoprolol dose was reduced with consideration of discontinuing in the future. Concern that this will increase her difficult to control  hypertension. Advised to return in 12 months for follow-up.   Echocardiogram 03/17/2021 revealed LVEF 50 to 55%, no RWMA, mildly dilated LV, mild LVH, normal diastolic parameters, normal RV prosthetic aortic valve functioning normally.  Dr. Sallyanne Kuster noted that the veins draining into the heart are not dilated, which generally suggest that the cause of leg swelling is not heart related.  Today, she is here per request of PCP for irregular heart rhythm on exam. Was seen by Dr. Marisue Humble on 8/21 for regular physical and reported to him that she is having worsening dyspnea on exertion.  He noted an irregularity with her heart on physical exam. Reports she does not feel well, does not have as much energy as she had in the past and simple household tasks such as washing dishes wears her out. She is a widow and lives alone. Walks end to end in her house because she cannot walk up an incline and does not feel safe walking outside. Symptoms were present at last office visit in 02/2021 with Dr. Sallyanne Kuster but she was hopeful that decrease in metoprolol dose would help her feel better, no improvement. Lying flat and sitting up causes her dizziness as well as occasional dizziness when she lies on her left side. 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 Has had syncope in the past but no recent presyncope or syncope. Occasionally feels some chest pressure.  No palpitations or tachycardia.  No orthopnea, PND, edema. No bleeding concerns on coumadin. INR has been therapeutic. BP at PCP office yesterday was 130/72.

## 2021-09-15 ENCOUNTER — Encounter: Payer: Self-pay | Admitting: Nurse Practitioner

## 2021-09-15 ENCOUNTER — Other Ambulatory Visit: Payer: Self-pay | Admitting: Nurse Practitioner

## 2021-09-15 ENCOUNTER — Ambulatory Visit (INDEPENDENT_AMBULATORY_CARE_PROVIDER_SITE_OTHER): Payer: PPO

## 2021-09-15 ENCOUNTER — Ambulatory Visit (INDEPENDENT_AMBULATORY_CARE_PROVIDER_SITE_OTHER): Payer: PPO | Admitting: Nurse Practitioner

## 2021-09-15 VITALS — BP 140/80 | HR 78 | Ht 64.0 in | Wt 180.6 lb

## 2021-09-15 DIAGNOSIS — I471 Supraventricular tachycardia: Secondary | ICD-10-CM | POA: Diagnosis not present

## 2021-09-15 DIAGNOSIS — Z7901 Long term (current) use of anticoagulants: Secondary | ICD-10-CM

## 2021-09-15 DIAGNOSIS — I499 Cardiac arrhythmia, unspecified: Secondary | ICD-10-CM

## 2021-09-15 DIAGNOSIS — I441 Atrioventricular block, second degree: Secondary | ICD-10-CM

## 2021-09-15 DIAGNOSIS — I10 Essential (primary) hypertension: Secondary | ICD-10-CM | POA: Diagnosis not present

## 2021-09-15 DIAGNOSIS — R0609 Other forms of dyspnea: Secondary | ICD-10-CM

## 2021-09-15 DIAGNOSIS — Z952 Presence of prosthetic heart valve: Secondary | ICD-10-CM | POA: Diagnosis not present

## 2021-09-15 DIAGNOSIS — R42 Dizziness and giddiness: Secondary | ICD-10-CM

## 2021-09-15 DIAGNOSIS — R5383 Other fatigue: Secondary | ICD-10-CM

## 2021-09-15 MED ORDER — METOPROLOL TARTRATE 50 MG PO TABS: ORAL_TABLET | ORAL | 0 refills | Status: DC

## 2021-09-15 NOTE — Progress Notes (Unsigned)
Enrolled for [employer] to mail a ZIO XT long term holter monitor to the patients address on file.   Dr. Croitoru to read. 

## 2021-09-15 NOTE — Patient Instructions (Addendum)
Medication Instructions: Your physician recommends that you continue on your current medications as directed. Please refer to the Current Medication list given to you today.     *If you need a refill on your cardiac medications before your next appointment, please call your pharmacy*   Lab Work: None ordered  If you have labs (blood work) drawn today and your tests are completely normal, you will receive your results only by: [place] (if you have MyChart) OR A paper copy in the mail If you have any lab test that is abnormal or we need to change your treatment, we will call you to review the results.   Testing/Procedures: Your physician has requested that you have cardiac CT. Cardiac computed tomography (CT) is a painless test that uses an x-ray machine to take clear, detailed pictures of your heart. For further information please visit [email or web address]. Please follow instruction sheet BELOW:    Your cardiac CT will be scheduled at one of the below locations:   [facility] [address] [place], [place] [address] [phone]   Please arrive at the [facility] Entrance (Entrance C2) of [facility] 30 minutes prior to test start time. You can use the FREE valet parking offered at entrance C (encouraged to control the heart rate for the test)  Proceed to the [facility] Radiology Department (first floor) to check-in and test prep.  All radiology patients and guests should use entrance C2 at [facility], accessed from [facility], even though the hospital's physical address listed is [address].    Please follow these instructions carefully (unless otherwise directed):   On the Night Before the Test: Be sure to Drink plenty of water. Do not consume any caffeinated/decaffeinated beverages or chocolate 12 hours prior to your test. Do not take any antihistamines 12 hours prior to your test.  On the Day  of the Test: Drink plenty of water until 1 hour prior to the test. Do not eat any food 4 hours prior to the test. You may take your regular medications prior to the test EXCEPT THE METOPROLOL XL.Marland Kitchen HOLD AND TAKE THE LOPRESSOR (SEE BELOW)  Take metoprolol (Lopressor) 50 mg two hours prior to test. This has been sent to [facility]) HOLD Furosemide/Hydrochlorothiazide morning of the test. FEMALES- please wear underwire-free bra if available, avoid dresses & tight clothing     After the Test: Drink plenty of water. After receiving IV contrast, you may experience a mild flushed feeling. This is normal. On occasion, you may experience a mild rash up to 24 hours after the test. This is not dangerous. If this occurs, you can take Benadryl 25 mg and increase your fluid intake. If you experience trouble breathing, this can be serious. If it is severe call 911 IMMEDIATELY. If it is mild, please call our office.   We will call to schedule your test 2-4 weeks out understanding that some insurance companies will need an authorization prior to the service being performed.   For non-scheduling related questions, please contact the cardiac imaging nurse navigator should you have any questions/concerns: Marchia Bond, Cardiac Imaging Nurse Navigator Gordy Clement, Cardiac Imaging Nurse Navigator [facility] Direct Office Dial: [phone]   For scheduling needs, including cancellations and rescheduling, please call [place], [phone].   ZIO AT Long term monitor-Live Telemetry  Your physician has requested you wear a ZIO patch monitor for 7 days.  This is a single patch  monitor. [employer] supplies one patch monitor per enrollment. Additional  stickers are not available.  Please do not apply patch if you will be having a Nuclear Stress Test, Echocardiogram, Cardiac CT, MRI,  or Chest Xray during the period you would be wearing the monitor. The patch cannot be worn during  these  tests. You cannot remove and re-apply the ZIO AT patch monitor.  Your ZIO patch monitor will be mailed 3 day USPS to your address on file. It may take 3-5 days to  receive your monitor after you have been enrolled.  Once you have received your monitor, please review the enclosed instructions. Your monitor has  already been registered assigning a specific monitor serial # to you.   Billing and Patient Assistance Program information  Theodore Demark has been supplied with any insurance information on record for billing. [employer] offers a sliding scale Patient Assistance Program for patients without insurance, or whose  insurance does not completely cover the cost of the ZIO patch monitor. You must apply for the  Patient Assistance Program to qualify for the discounted rate. To apply, call [employer] at [phone],  select option 4, select option 2 , ask to apply for the Patient Assistance Program, (you can request an  interpreter if needed). [employer] will ask your household income and how many people are in your  household. [employer] will quote your out-of-pocket cost based on this information. They will also be able  to set up a 12 month interest free payment plan if needed.  Applying the monitor   Shave hair from upper left chest.  Hold the abrader disc by orange tab. Rub the abrader in 40 strokes over left upper chest as indicated in  your monitor instructions.  Clean area with 4 enclosed alcohol pads. Use all pads to ensure the area is cleaned thoroughly. Let  dry.  Apply patch as indicated in monitor instructions. Patch will be placed under collarbone on left side of  chest with arrow pointing upward.  Rub patch adhesive wings for 2 minutes. Remove the white label marked "1". Remove the white label  marked "2". Rub patch adhesive wings for 2 additional minutes.  While looking in a mirror, press and release button in center of patch. A small green light will flash 3-4  times. This will be your only  indicator that the monitor has been turned on.  Do not shower for the first 24 hours. You may shower after the first 24 hours.  Press the button if you feel a symptom. You will hear a small click. Record Date, Time and Symptom in  the Patient Log.   Starting the Gateway  In your kit there is a Hydrographic surveyor box the size of a cellphone. This is Airline pilot. It transmits all your  recorded data to [facility]. This box must always stay within 10 feet of you. Open the box and push the *  button. There will be a light that blinks orange and then green a few times. When the light stops  blinking, the Gateway is connected to the ZIO patch. Call [employer] at [phone] to confirm your monitor is transmitting.  Returning your monitor  Remove your patch and place it inside the [place]. In the lower half of the Gateway there is a white  bag with prepaid postage on it. Place Gateway in bag and seal. Mail package back to [address] as soon as  possible. Your physician should have your final report approximately 7 days after you  have mailed back  your monitor. Call [place] at [phone] if you have questions regarding your ZIO AT  patch monitor. Call them immediately if you see an orange light blinking on your monitor.  If your monitor falls off in less than 4 days, contact our Monitor department at [phone]. If your  monitor becomes loose or falls off after 4 days call [employer] at [phone] for suggestions on  securing your monitor   Follow-Up: At [facility], you and your health needs are our priority.  As part of our continuing mission to provide you with exceptional heart care, we have created designated Provider Care Teams.  These Care Teams include your primary Cardiologist (physician) and Advanced Practice Providers (APPs -  Physician Assistants and Nurse Practitioners) who all work together to provide you with the care you need, when you need it.  We  recommend signing up for the patient portal called "MyChart".  Sign up information is provided on this After Visit Summary.  MyChart is used to connect with patients for Virtual Visits (Telemedicine).  Patients are able to view lab/test results, encounter notes, upcoming appointments, etc.  Non-urgent messages can be sent to your provider as well.   To learn more about what you can do with MyChart, go to [email or web address].    Your next appointment:   2-3  month(s)  The format for your next appointment:   In Person  Provider:   Sanda Klein, MD  or Christen Bame, NP   Other Instructions   Important Information About Sugar

## 2021-09-18 DIAGNOSIS — I441 Atrioventricular block, second degree: Secondary | ICD-10-CM | POA: Diagnosis not present

## 2021-09-18 DIAGNOSIS — I499 Cardiac arrhythmia, unspecified: Secondary | ICD-10-CM

## 2021-09-18 DIAGNOSIS — R42 Dizziness and giddiness: Secondary | ICD-10-CM | POA: Diagnosis not present

## 2021-09-21 ENCOUNTER — Other Ambulatory Visit: Payer: Self-pay | Admitting: Cardiovascular Disease

## 2021-09-21 DIAGNOSIS — Z7901 Long term (current) use of anticoagulants: Secondary | ICD-10-CM

## 2021-09-23 ENCOUNTER — Other Ambulatory Visit: Payer: Self-pay | Admitting: Cardiovascular Disease

## 2021-10-02 ENCOUNTER — Telehealth (HOSPITAL_COMMUNITY): Payer: Self-pay | Admitting: *Deleted

## 2021-10-02 DIAGNOSIS — R42 Dizziness and giddiness: Secondary | ICD-10-CM | POA: Diagnosis not present

## 2021-10-02 DIAGNOSIS — I499 Cardiac arrhythmia, unspecified: Secondary | ICD-10-CM | POA: Diagnosis not present

## 2021-10-02 NOTE — Telephone Encounter (Signed)
Reaching out to patient to offer assistance regarding upcoming cardiac imaging study; pt verbalizes understanding of appt date/time, parking situation and where to check in, and medications ordered, and verified current allergies; name and call back number provided for further questions should they arise  Gordy Clement RN Navigator Cardiac [place] [facility] [phone] office [phone] cell  Patient to hold her daily metoprolol succiante and take '50mg'$  metoprolol tartrate TWO hours prior to her cardiac CT scan.

## 2021-10-05 ENCOUNTER — Ambulatory Visit (HOSPITAL_COMMUNITY)
Admission: RE | Admit: 2021-10-05 | Discharge: 2021-10-05 | Disposition: A | Discharge status: Home / Self Care | Payer: PPO | Source: Ambulatory Visit | Attending: Nurse Practitioner | Admitting: Nurse Practitioner

## 2021-10-05 DIAGNOSIS — Z952 Presence of prosthetic heart valve: Secondary | ICD-10-CM | POA: Diagnosis not present

## 2021-10-05 DIAGNOSIS — I2510 Atherosclerotic heart disease of native coronary artery without angina pectoris: Secondary | ICD-10-CM | POA: Insufficient documentation

## 2021-10-05 DIAGNOSIS — R0609 Other forms of dyspnea: Secondary | ICD-10-CM | POA: Diagnosis not present

## 2021-10-05 DIAGNOSIS — K449 Diaphragmatic hernia without obstruction or gangrene: Secondary | ICD-10-CM | POA: Diagnosis not present

## 2021-10-05 MED ORDER — NITROGLYCERIN 0.4 MG SL SUBL: 0.8000 mg | SUBLINGUAL_TABLET | Freq: Once | SUBLINGUAL | Status: AC

## 2021-10-05 MED ORDER — NITROGLYCERIN 0.4 MG SL SUBL
SUBLINGUAL_TABLET | SUBLINGUAL | Status: AC
  Administered 2021-10-05: 0.8 mg via SUBLINGUAL
  Filled 2021-10-05: qty 2

## 2021-10-05 MED ORDER — IOHEXOL 350 MG/ML SOLN
100.0000 mL | Freq: Once | INTRAVENOUS | Status: AC | PRN
  Administered 2021-10-05: 100 mL via INTRAVENOUS

## 2021-10-06 ENCOUNTER — Telehealth: Payer: Self-pay | Admitting: *Deleted

## 2021-10-06 ENCOUNTER — Other Ambulatory Visit: Payer: Self-pay | Admitting: *Deleted

## 2021-10-06 DIAGNOSIS — R5383 Other fatigue: Secondary | ICD-10-CM

## 2021-10-06 DIAGNOSIS — I491 Atrial premature depolarization: Secondary | ICD-10-CM

## 2021-10-06 NOTE — Telephone Encounter (Signed)
-----   Message from Emmaline Life, NP sent at 10/06/2021  3:51 PM EDT ----- Please order TSH

## 2021-10-06 NOTE — Telephone Encounter (Signed)
S/w pt to let pt know Sharon Rose would like a TSH.TSH ordered and linked, appt made.

## 2021-10-07 ENCOUNTER — Ambulatory Visit: Payer: PPO | Attending: Internal Medicine

## 2021-10-07 DIAGNOSIS — I491 Atrial premature depolarization: Secondary | ICD-10-CM | POA: Diagnosis not present

## 2021-10-07 DIAGNOSIS — R5383 Other fatigue: Secondary | ICD-10-CM

## 2021-10-07 LAB — TSH: TSH: 1.57 u[IU]/mL (ref 0.450–4.500)

## 2021-10-08 ENCOUNTER — Ambulatory Visit: Payer: PPO | Attending: Cardiology

## 2021-10-08 DIAGNOSIS — Z7901 Long term (current) use of anticoagulants: Secondary | ICD-10-CM | POA: Diagnosis not present

## 2021-10-08 DIAGNOSIS — Z952 Presence of prosthetic heart valve: Secondary | ICD-10-CM | POA: Diagnosis not present

## 2021-10-08 LAB — POCT INR: INR: 2.9 (ref 2.0–3.0)

## 2021-10-08 NOTE — Patient Instructions (Signed)
Description   Continue same dose 1 tablet daily except 1.5 tablets each Wednesday.   Repeat INR in 7 weeks.   Coumadin Clinic [phone]

## 2021-10-09 DIAGNOSIS — Z1231 Encounter for screening mammogram for malignant neoplasm of breast: Secondary | ICD-10-CM | POA: Diagnosis not present

## 2021-11-02 ENCOUNTER — Encounter: Payer: Self-pay | Admitting: Cardiovascular Disease

## 2021-11-02 ENCOUNTER — Ambulatory Visit: Payer: PPO | Attending: Cardiovascular Disease | Admitting: Cardiovascular Disease

## 2021-11-02 VITALS — BP 138/84 | HR 64 | Ht 64.0 in | Wt 180.0 lb

## 2021-11-02 DIAGNOSIS — I491 Atrial premature depolarization: Secondary | ICD-10-CM

## 2021-11-02 NOTE — Progress Notes (Signed)
Cardiology Office Note:    Date:  11/02/2021   ID:  Sharon Rose, DOB 05/03/1940, MRN [id number]  PCP:  Blair Heys, MD   [facility] Providers Cardiologist:  Thurmon Fair, MD Electrophysiologist:  Maurice Small, MD     Referring MD: Levi Aland, NP   Chief complaint: shortness of breath on exertion  History of Present Illness:    Sharon Rose is a 81 y.o. female with a hx of mechanical aortic valve, HTN, HLD referred for arrhythmia management.  Her primary complaint is shortness of breath with exertion. This occurs primarily when she is walking a hilly route. 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  She reports that she has had palpitations since her aortic valve surgery. A monitor was placed that showed frequent PACs and atrial runs. Second degree AV block was also reported, but I'm not 100% convinced that she is having atrial runs with a pause at the end of the run prior to a sinus or another ectopic atrial beat. She notes that she has had syncope in the distant past.    Past Medical History:  Diagnosis Date   CAD (coronary artery disease)    NUCLEAR STRESS TEST, 10/07/2008 - no ischemia   Dyspnea on exertion    H/O aortic valve replacement    2D ECHO, 04/26/2011 - EF 50-55%, normal-mild   Hypertension    NSTEMI (non-ST elevated myocardial infarction) (HCC) 12/2004   Secondary to occlusion of OM 2, small vessel and medical therapy recommended; cath 2011 with no CAD    Past Surgical History:  Procedure Laterality Date   AORTIC VALVE REPLACEMENT  09/12/2004   St. Jude mechanical aortic valve, model #23AK-501, serial #[id number]   CARDIAC CATHETERIZATION Left 03/25/2009   Normal cors   CARDIAC CATHETERIZATION  01/20/2005   Medical management for occluded and possibly revascularized OM 2, string sign seen   CARDIAC CATHETERIZATION  08/11/2004    Elective aortic valve replacement, severe aortic valvre stenosis with functional class II to III symptomatology    Current Medications: Current Meds  Medication Sig   amLODipine (NORVASC) 2.5 MG tablet TAKE 1 TABLET BY MOUTH EVERY DAY   aspirin EC 81 MG tablet Take 81 mg by mouth daily.   calcium carbonate (OS-CAL) 600 MG TABS Take 600 mg by mouth 2 (two) times daily with a meal.   losartan (COZAAR) 100 MG tablet TAKE 1 TABLET BY MOUTH EVERY DAY (STOP TAKING THE LOSARTAN-HCTZ COMBO TAB)   metoprolol succinate (TOPROL-XL) 25 MG 24 hr tablet TAKE 1 TABLET BY MOUTH EVERY DAY WITH OR IMMEDIATELY FOLLOWING A MEAL (Patient taking differently: 12.5 mg daily.)   metoprolol tartrate (LOPRESSOR) 50 MG tablet TAKE 1 TABLET BY MOUTH 2 HOURS PRIOR TO YOUR CARDIAC  CT   Multiple Vitamin (MULTIVITAMIN) tablet Take 1 tablet by mouth daily.   omeprazole (PRILOSEC) 20 MG capsule Take 20 mg by mouth daily.   rosuvastatin (CRESTOR) 5 MG tablet Take 5 mg by mouth every evening.    warfarin (COUMADIN) 5 MG tablet TAKE 1 - 1&1/2 TABLETS BY MOUTH EVERY DAY AS DIRECTED BY COUMADIN CLINIC     Allergies:   Codeine, Statins, Sulfa antibiotics, and Verapamil   Social History   Socioeconomic History   Marital status: Widowed    Spouse name: Not on file   Number of children: Not on file  Years of education: Not on file   Highest education level: Not on file  Occupational History   Not on file  Tobacco Use   Smoking status: Never   Smokeless tobacco: Never  Substance and Sexual Activity   Alcohol use: No   Drug use: No   Sexual activity: Not on file  Other Topics Concern   Not on file  Social History Narrative   Not on file   Social Determinants of Health   Financial Resource Strain: Not on file  Food Insecurity: Not on file  Transportation Needs: Not on file  Physical Activity: Not on file  Stress: Not on file  Social Connections: Not on file     Family History: The patient's family history is  not on file.  ROS:   Please see the history of present illness.    All other systems reviewed and are negative.  EKGs/Labs/Other Studies Reviewed:     Zio patch 10/06/2021   Predominant rhythm is normal sinus rhythm with normal circadian variation.   Rare and very brief episodes of nonsustained ventricular tachycardia are seen.   There are very frequent premature atrial contractions (roughly 20% of all beats) and there are very frequent but brief episodes of nonsustained supraventricular tachycardia (maximum 19 beats, roughly 10 seconds in duration).  The episodes of supraventricular tachycardia correlate well with the patient's symptoms.   There is no evidence of atrial fibrillation   Second-degree atrioventricular block Mobitz type I is frequently seen, both during sinus rhythm and during atrial tachycardia.  She states that  EKG:  Last EKG results: Frequent atrial ectopy, occurring in runs. Possibly a blocked PAC   Recent Labs: 10/07/2021: TSH 1.570     Physical Exam:    VS:  BP 138/84   Pulse 64   Ht 5\' 4"  (1.626 m)   Wt 180 lb (81.6 kg)   SpO2 97%   BMI 30.90 kg/m     Wt Readings from Last 3 Encounters:  11/02/21 180 lb (81.6 kg)  09/15/21 180 lb 9.6 oz (81.9 kg)  04/13/21 182 lb (82.6 kg)     GEN: Well nourished, well developed in no acute distress CARDIAC: Irregular rhythm, no murmurs, rubs, gallops RESPIRATORY:  Normal work of breathing MUSCULOSKELETAL: trace edema    ASSESSMENT & PLAN:    Exertional dyspnea: Not sure this is related to rhythm. Will order routine exercise stress test to assess chronotropic competence and adequacy of heart rate response. Frequent atrial ectopy: does not appear to be terribly symptomatic for her. Does not appear to be responsive to betablocker. AV block: I have not seen with I think is definitively second degree block in sinus rhythm, but I suspect she probably has had episodes given the extent of her first degree block. I would  not consider a class I antiarrhythmic for her without a pacemaker, and even then she also has some coronary disease though it appears mild.           Medication Adjustments/Labs and Tests Ordered: Current medicines are reviewed at length with the patient today.  Concerns regarding medicines are outlined above.  No orders of the defined types were placed in this encounter.  No orders of the defined types were placed in this encounter.    Signed, Maurice Small, MD  11/02/2021 8:07 AM    [facility]

## 2021-11-02 NOTE — Patient Instructions (Signed)
Medication Instructions:  Your physician recommends that you continue on your current medications as directed. Please refer to the Current Medication list given to you today.  *If you need a refill on your cardiac medications before your next appointment, please call your pharmacy*  Testing/Procedures: Your physician has requested that you have an exercise tolerance test. For further information please visit [email or web address]. Please also follow instruction sheet, as given.  Follow-Up: At [facility], you and your health needs are our priority.  As part of our continuing mission to provide you with exceptional heart care, we have created designated Provider Care Teams.  These Care Teams include your primary Cardiologist (physician) and Advanced Practice Providers (APPs -  Physician Assistants and Nurse Practitioners) who all work together to provide you with the care you need, when you need it.  Your next appointment:   2 month(s)  The format for your next appointment:   In Person  Provider:   You may see Melida Quitter, MD or one of the following Advanced Practice Providers on your designated Care Team:   Tommye Standard, [place] Legrand Como "[facility]" [place], [place]

## 2021-11-03 NOTE — Addendum Note (Signed)
Addended by: Antonieta Iba on: 11/03/2021 08:54 AM   Modules accepted: Orders

## 2021-11-05 ENCOUNTER — Ambulatory Visit: Payer: PPO | Attending: Cardiovascular Disease

## 2021-11-05 DIAGNOSIS — I491 Atrial premature depolarization: Secondary | ICD-10-CM | POA: Diagnosis not present

## 2021-11-05 LAB — EXERCISE TOLERANCE TEST
Angina Index: 0
Duke Treadmill Score: 5
Estimated workload: 6.8
Exercise duration (min): 4 min
Exercise duration (sec): 53 s
MPHR: 140 {beats}/min
Peak HR: 141 {beats}/min
Percent HR: 100 %
Rest HR: 70 {beats}/min
ST Depression (mm): 0 mm

## 2021-11-17 ENCOUNTER — Other Ambulatory Visit: Payer: Self-pay | Admitting: Cardiovascular Disease

## 2021-11-23 ENCOUNTER — Encounter (INDEPENDENT_AMBULATORY_CARE_PROVIDER_SITE_OTHER): Payer: Self-pay

## 2021-11-26 ENCOUNTER — Ambulatory Visit: Payer: PPO | Attending: Cardiology

## 2021-11-26 DIAGNOSIS — Z7901 Long term (current) use of anticoagulants: Secondary | ICD-10-CM | POA: Diagnosis not present

## 2021-11-26 DIAGNOSIS — Z952 Presence of prosthetic heart valve: Secondary | ICD-10-CM

## 2021-11-26 LAB — POCT INR: INR: 3 (ref 2.0–3.0)

## 2021-11-26 NOTE — Patient Instructions (Signed)
Continue same dose 1 tablet daily except 1.5 tablets each Wednesday.   Repeat INR in 8 weeks.   Coumadin Clinic [phone]

## 2021-12-14 NOTE — Progress Notes (Unsigned)
Cardiology Office Note:    Date:  12/16/2021   ID:  Sharon Rose, DOB [birth year], MRN [id number]  PCP:  Blair Heys, MD   [facility] Providers Cardiologist:  Thurmon Fair, MD Electrophysiologist:  Maurice Small, MD     Referring MD: Blair Heys, MD   Chief Complaint: irregular heart rate  History of Present Illness:    Sharon Rose is a very pleasant 81 y.o. female with a hx of severe AS s/p mechanical aortic valve prosthesis, HTN, and HLD.  She had replacement of a severely stenotic probably congenitally bicuspid aortic valve in 2006 with a 23 mm mechanical valve. Coronary angiography before surgery showed total occlusion of the mid second oblique marginal artery but no angina and no evidence of ischemia by nuclear stress testing.  Repeat coronary angiography in March 2011 did not confirm occlusion of the second oblique marginal branch.  She had normal LV function and no history of embolic events.  2014 she presented with thiazide induced hypokalemia and bradycardia.  Her heart rate normalized after electrolyte correction.  We tried to stop diuretic completely but this was associated with ankle swelling and difficult to control blood pressure.  History of postoperative atrial fibrillation without detected recurrence since that time. Normal 30-day event monitor July 2017.  History of bradycardia on higher doses of beta-blocker, worsening ankle edema on high-dose amlodipine and syncope, near syncope when she was taking diuretics on a daily basis.  No longer takes diuretics on a daily basis and LE edema especially well controlled if she was wearing compression stockings.  Was last seen in our office on 03/04/2021 by Dr. Royann Shivers at which time EKG showed second-degree AV block with notation of problems with long AV conduction times for awhile and now evidence of Wenckebach cycles.  Metoprolol dose was reduced with consideration of discontinuing in the future. Concern that  this will increase her difficult to control hypertension. Advised to return in 12 months for follow-up.   Echocardiogram 03/17/2021 revealed LVEF 50 to 55%, no RWMA, mildly dilated LV, mild LVH, normal diastolic parameters, normal RV prosthetic aortic valve functioning normally.  Dr. Royann Shivers noted that the veins draining into the heart are not dilated, which generally suggest that the cause of leg swelling is not heart related.  Seen by me on 09/15/21 per request of PCP for irregular heart rhythm on exam. Was seen by Dr. Manus Gunning on 8/21 for regular physical and reported to him that she is having worsening dyspnea on exertion.  He noted an irregularity with her heart on physical exam. Reported not feeling well, does not have as much energy as she had in the past and simple household tasks such as washing dishes wears her out. Is a widow and lives alone. Walks end to end in her house because she cannot walk up an incline and does not feel safe walking outside. Symptoms were present at last office visit in 02/2021 with Dr. Royann Shivers but she was hopeful that decrease in metoprolol dose would help her feel better, no improvement. Lying flat and sitting up causes her dizziness as well as occasional dizziness when she lies on her left side. 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 Had syncope in the past but no recent presyncope or syncope. Occasionally feels some chest pressure. No palpitations or tachycardia.  No orthopnea, PND, edema. No bleeding concerns on coumadin. INR has been therapeutic. BP at PCP office the day prior  was 130/72.   Coronary CTA revealed minimal nonobstructive CAD (0 to 24%) with coronary calcium score 77 which is 44th percentile for age/sex . Cardiac monitor 10/06/2021 revealed predominant rhythm is sinus rhythm with normal circadian variation, rare and very brief episodes of NSVT, frequent premature atrial contractions, 20%, and very frequent  but brief episodes of nonsustained SVT which correlate with the patient's symptoms.  No evidence of atrial fibrillation, second-degree AV block Mobitz type I seen frequently during sinus rhythm and atrial tachycardia.  Referred to EP and seen by Dr. Nelly Laurence on 11/02/21.  He felt AV block was not clearly second-degree block in sinus rhythm but suspected episodes given the degree of her first-degree block.  Consideration for antiarrhythmic.  He ordered an exercise stress test to assess chronotropic competence and adequacy of heart rate response with her exertional dyspnea.  ETT 11/05/2021 revealed HR rose from 83-141 with exercise, however frequent PACs were noted and there did appear to be a short run of atrial fibrillation.  No ischemic changes were noted.  Today, she reports she is feeling well.  Shortness of breath has improved by making dietary changes 2/2 hiatal hernia that was reported on CT. Is eating smaller meals more frequently through the day and this has improved SOB. She denies palpitations, is essentially unaware of frequent PACs. No chest pain, orthopnea, PND, presyncope, and syncope.   Past Medical History:  Diagnosis Date   CAD (coronary artery disease)    NUCLEAR STRESS TEST, 10/07/2008 - no ischemia   Dyspnea on exertion    H/O aortic valve replacement    2D ECHO, 04/26/2011 - EF 50-55%, normal-mild   Hypertension    NSTEMI (non-ST elevated myocardial infarction) (HCC) 12/2004   Secondary to occlusion of OM 2, small vessel and medical therapy recommended; cath 2011 with no CAD    Past Surgical History:  Procedure Laterality Date   AORTIC VALVE REPLACEMENT  09/12/2004   St. Jude mechanical aortic valve, model #23AK-501, serial #[id number]   CARDIAC CATHETERIZATION Left 03/25/2009   Normal cors   CARDIAC CATHETERIZATION  01/20/2005   Medical management for occluded and possibly revascularized OM 2, string sign seen   CARDIAC CATHETERIZATION  08/11/2004   Elective aortic valve  replacement, severe aortic valvre stenosis with functional class II to III symptomatology    Current Medications: Current Meds  Medication Sig   amLODipine (NORVASC) 2.5 MG tablet TAKE 1 TABLET BY MOUTH EVERY DAY   aspirin EC 81 MG tablet Take 81 mg by mouth daily.   calcium carbonate (OS-CAL) 600 MG TABS Take 600 mg by mouth 2 (two) times daily with a meal.   losartan (COZAAR) 100 MG tablet TAKE 1 TABLET BY MOUTH EVERY DAY (STOP TAKING THE LOSARTAN-HCTZ COMBO TAB)   metoprolol succinate (TOPROL-XL) 25 MG 24 hr tablet TAKE 1 TABLET BY MOUTH EVERY DAY WITH OR IMMEDIATELY FOLLOWING A MEAL (Patient taking differently: 12.5 mg daily.)   metoprolol tartrate (LOPRESSOR) 50 MG tablet TAKE 1 TABLET BY MOUTH 2 HOURS PRIOR TO YOUR CARDIAC  CT   Multiple Vitamin (MULTIVITAMIN) tablet Take 1 tablet by mouth daily.   omeprazole (PRILOSEC) 20 MG capsule Take 20 mg by mouth daily.   rosuvastatin (CRESTOR) 5 MG tablet Take 5 mg by mouth every evening.    warfarin (COUMADIN) 5 MG tablet TAKE 1 - 1&1/2 TABLETS BY MOUTH EVERY DAY AS DIRECTED BY COUMADIN CLINIC     Allergies:   Codeine, Statins, Sulfa antibiotics, and Verapamil   Social History  Socioeconomic History   Marital status: Widowed    Spouse name: Not on file   Number of children: Not on file   Years of education: Not on file   Highest education level: Not on file  Occupational History   Not on file  Tobacco Use   Smoking status: Never   Smokeless tobacco: Never  Substance and Sexual Activity   Alcohol use: No   Drug use: No   Sexual activity: Not on file  Other Topics Concern   Not on file  Social History Narrative   Not on file   Social Determinants of Health   Financial Resource Strain: Not on file  Food Insecurity: Not on file  Transportation Needs: Not on file  Physical Activity: Not on file  Stress: Not on file  Social Connections: Not on file     Family History: The patient's family history is not on file.  ROS:    Please see the history of present illness.  All other systems reviewed and are negative.  Labs/Other Studies Reviewed:    The following studies were reviewed today:  ETT 11/05/21    No ST deviation was noted.   Prior study available for comparison from 10/07/2008.   Exercise stress test for chronotropic competence. HR rose from 83 to 141 with exercise, however, frequent PAC's were noted and there appeared to be a short run of atrial fibrillation.  No ischemic changes were noted.  CCTA 10/06/21  1. Minimal CAD in the proximal and mid LAD, <25% stenosis, CADRADS 1.   2. Coronary calcium score is 77, which places the patient in the 44th percentile for age and sex matched control.   3. Normal coronary origins with left dominance.   4. 23 mm St. Jude mechanical aortic valve is present. Normal bileaflet disc motion.   RECOMMENDATIONS: CAD-RADS 1. Minimal non-obstructive CAD (0-24%). Consider non-atherosclerotic causes of chest pain. Consider preventive therapy and risk factor modification.  Cardiac monitor 10/06/21    Predominant rhythm is normal sinus rhythm with normal circadian variation.   Rare and very brief episodes of nonsustained ventricular tachycardia are seen.   There are very frequent premature atrial contractions (roughly 20% of all beats) and there are very frequent but brief episodes of nonsustained supraventricular tachycardia (maximum 19 beats, roughly 10 seconds in duration).  The episodes of supraventricular tachycardia correlate well with the patient's symptoms.   There is no evidence of atrial fibrillation   Second-degree atrioventricular block Mobitz type I is frequently seen, both during sinus rhythm and during atrial tachycardia.  She states that  Echo 03/17/21  1. Low normal EF stable since TTE done 07/23/19. Left ventricular ejection  fraction, by estimation, is 50 to 55%. The left ventricle has low normal  function. The left ventricle has no regional wall  motion abnormalities.  The left ventricular internal  cavity size was mildly dilated. There is mild left ventricular  hypertrophy. Left ventricular diastolic parameters were normal.   2. Right ventricular systolic function is normal. The right ventricular  size is normal.   3. Left atrial size was mildly dilated.   4. The mitral valve is abnormal. Trivial mitral valve regurgitation. No  evidence of mitral stenosis.   5. Trivial closing volume AR . The aortic valve has been  repaired/replaced. Aortic valve regurgitation is trivial. No aortic  stenosis is present.   6. Aortic dilatation noted. There is mild dilatation of the ascending  aorta, measuring 40 mm.   7. The inferior  vena cava is normal in size with greater than 50%  respiratory variability, suggesting right atrial pressure of 3 mmHg.   Cardiac monitor 08/14/19  Predominant rhythm is normal sinus rhythm with normal circadian variation. There are very frequent premature atrial complexes, frequent atrial couplets and frequent episodes of brief nonsustained ectopic atrial tachycardia True atrial fibrillation is not recorded. There are no episodes of significant bradycardia and there are rare isolated PVCs. A single 5 beat nocturnal run of accelerated idioventricular rhythm is seen.   Abnormal event monitor due to very frequent episodes of brief nonsustained ectopic atrial tachycardia.   No atrial fibrillation is seen.  No meaningful bradycardia or ventricular arrhythmia is seen.  LHC 08/11/2004  Angiographically patent coronary arteries Mildly depressed LV systolic function with EF 40 to 50% with global wall motion abnormality Severe aortic valve stenosis with functional class II-III symptomology Normal right heart pressures, normal cardiac output determinations  Recent Labs: 10/07/2021: TSH 1.570  Recent Lipid Panel    Component Value Date/Time   CHOL 190 07/08/2014 1053   TRIG 188 (H) 07/08/2014 1053   HDL 46 07/08/2014  1053   CHOLHDL 4.1 07/08/2014 1053   VLDL 38 07/08/2014 1053   LDLCALC 106 (H) 07/08/2014 1053     Risk Assessment/Calculations:      Physical Exam:    VS:  BP 134/80   Pulse 66   Ht 5\' 4"  (1.626 m)   Wt 184 lb 6.4 oz (83.6 kg)   SpO2 98%   BMI 31.65 kg/m     Wt Readings from Last 3 Encounters:  12/16/21 184 lb 6.4 oz (83.6 kg)  11/02/21 180 lb (81.6 kg)  09/15/21 180 lb 9.6 oz (81.9 kg)     GEN:  Well nourished, well developed in no acute distress HEENT: Normal NECK: No JVD; No carotid bruits CARDIAC: Irregular RR. Crisp click of mechanical valve. No rubs, gallops RESPIRATORY:  Clear to auscultation without rales, wheezing or rhonchi  ABDOMEN: Soft, non-tender, non-distended MUSCULOSKELETAL:  No edema; No deformity. 2+ pedal pulses, equal bilaterally SKIN: Warm and dry NEUROLOGIC:  Alert and oriented x 3 PSYCHIATRIC:  Normal affect   EKG:  EKG is not ordered today.  EKG from PCP 8/21 reviewed and reveals NSR at 61 bpm with 1st degree AV block, PR interval 254 ms      Diagnoses:    1. PAC (premature atrial contraction)   2. Long term current use of anticoagulant   3. Mobitz (type) I (Wenckebach's) atrioventricular block   4. Coronary artery disease involving native coronary artery of native heart without angina pectoris   5. Dyspnea on exertion   6. H/O aortic valve replacement   7. Essential hypertension   8. Hyperlipidemia LDL goal <70     Assessment and Plan:     Frequent PACs: Frequent PACs about 20% of heartbeats on cardiac monitor 10/06/2021. Referred to EP and seen by Dr. Nelly Laurence 10/9. Normal chronotropic competence on ETT 11/05/21. Has not discussed results with Dr. Nelly Laurence.  She sees him in a few weeks. Will await his recommendations.   DOE: Coronary CTA 10/06/21 due to concerns for DOE that could be angina equivalent. CCTA revealed minimal CAD in the proximal and mid LAD, < 25% stenosis, calcium score 77 (44th percentile).  Hiatal hernia was also  identified. She reports improvement in shortness of breath since she has changed her diet, eating slower. No further concerns with DOE.   Heart block: Zio monitor 10/06/2021 revealed second-degree AV block  type I with no significant bradycardia. Per Dr. Nelly Laurence, likely that she does have some second-degree given the extent of her first-degree block but question whether it was occurring as frequently as reported on monitor. Management per Dr. Nelly Laurence.   Hypertension: BP is well-controlled.   Mechanical AVR on chronic anticoagulation: s/p AVR 2006 2/2 bicuspid valve. Crisp click of mechanical valve on ausculation. Normal function of aortic valve with trivial regurgitation and no aortic stenosis present on echocardiogram 03/17/2021. No indication of worsening valve function. Will follow clinically for now.   CAD without angina: Coronary CTA 09/2021 with minimal CAD in proximal and mid LAD < 25% stenosis. She denies chest pain, dyspnea, or other symptoms concerning for angina. DOE has improved with changing the way she eats 2/2 hiatal hernia. No indication for further ischemic evaluation at this time  Hyperlipidemia: LDL 67 on 12/22/20. Continue rosuvastatin.    Disposition: Keep your December appointment with Dr. Dondra Spry months with Dr. Royann Shivers  Medication Adjustments/Labs and Tests Ordered: Current medicines are reviewed at length with the patient today.  Concerns regarding medicines are outlined above.  No orders of the defined types were placed in this encounter.  No orders of the defined types were placed in this encounter.   Patient Instructions  Medication Instructions:   Your physician recommends that you continue on your current medications as directed. Please refer to the Current Medication list given to you today.   *If you need a refill on your cardiac medications before your next appointment, please call your pharmacy*   Lab Work:  None ordered.  If you have labs (blood work)  drawn today and your tests are completely normal, you will receive your results only by: MyChart Message (if you have MyChart) OR A paper copy in the mail If you have any lab test that is abnormal or we need to change your treatment, we will call you to review the results.   Testing/Procedures:  None ordered.   Follow-Up: At [facility], you and your health needs are our priority.  As part of our continuing mission to provide you with exceptional heart care, we have created designated Provider Care Teams.  These Care Teams include your primary Cardiologist (physician) and Advanced Practice Providers (APPs -  Physician Assistants and Nurse Practitioners) who all work together to provide you with the care you need, when you need it.  We recommend signing up for the patient portal called "MyChart".  Sign up information is provided on this After Visit Summary.  MyChart is used to connect with patients for Virtual Visits (Telemedicine).  Patients are able to view lab/test results, encounter notes, upcoming appointments, etc.  Non-urgent messages can be sent to your provider as well.   To learn more about what you can do with MyChart, go to [email or web address].    Your next appointment:   4 month(s)  The format for your next appointment:   In Person  Provider:   Thurmon Fair, MD     Important Information About Sugar         Signed, Levi Aland, NP  12/16/2021 12:22 PM    [facility]

## 2021-12-16 ENCOUNTER — Other Ambulatory Visit: Payer: Self-pay | Admitting: Cardiovascular Disease

## 2021-12-16 ENCOUNTER — Ambulatory Visit: Payer: PPO | Attending: Nurse Practitioner | Admitting: Nurse Practitioner

## 2021-12-16 ENCOUNTER — Encounter: Payer: Self-pay | Admitting: Nurse Practitioner

## 2021-12-16 VITALS — BP 134/80 | HR 66 | Ht 64.0 in | Wt 184.4 lb

## 2021-12-16 DIAGNOSIS — I10 Essential (primary) hypertension: Secondary | ICD-10-CM

## 2021-12-16 DIAGNOSIS — I441 Atrioventricular block, second degree: Secondary | ICD-10-CM

## 2021-12-16 DIAGNOSIS — E785 Hyperlipidemia, unspecified: Secondary | ICD-10-CM | POA: Diagnosis not present

## 2021-12-16 DIAGNOSIS — Z7901 Long term (current) use of anticoagulants: Secondary | ICD-10-CM | POA: Diagnosis not present

## 2021-12-16 DIAGNOSIS — Z952 Presence of prosthetic heart valve: Secondary | ICD-10-CM | POA: Diagnosis not present

## 2021-12-16 DIAGNOSIS — R0609 Other forms of dyspnea: Secondary | ICD-10-CM | POA: Diagnosis not present

## 2021-12-16 DIAGNOSIS — I491 Atrial premature depolarization: Secondary | ICD-10-CM | POA: Diagnosis not present

## 2021-12-16 DIAGNOSIS — I2510 Atherosclerotic heart disease of native coronary artery without angina pectoris: Secondary | ICD-10-CM

## 2021-12-16 NOTE — Telephone Encounter (Signed)
Last INR 11/2/20223  Last OV 11/02/2021

## 2021-12-16 NOTE — Patient Instructions (Signed)
Medication Instructions:   Your physician recommends that you continue on your current medications as directed. Please refer to the Current Medication list given to you today.   *If you need a refill on your cardiac medications before your next appointment, please call your pharmacy*   Lab Work:  None ordered.  If you have labs (blood work) drawn today and your tests are completely normal, you will receive your results only by: [place] (if you have MyChart) OR A paper copy in the mail If you have any lab test that is abnormal or we need to change your treatment, we will call you to review the results.   Testing/Procedures:  None ordered.   Follow-Up: At [facility], you and your health needs are our priority.  As part of our continuing mission to provide you with exceptional heart care, we have created designated Provider Care Teams.  These Care Teams include your primary Cardiologist (physician) and Advanced Practice Providers (APPs -  Physician Assistants and Nurse Practitioners) who all work together to provide you with the care you need, when you need it.  We recommend signing up for the patient portal called "MyChart".  Sign up information is provided on this After Visit Summary.  MyChart is used to connect with patients for Virtual Visits (Telemedicine).  Patients are able to view lab/test results, encounter notes, upcoming appointments, etc.  Non-urgent messages can be sent to your provider as well.   To learn more about what you can do with MyChart, go to [email or web address].    Your next appointment:   4 month(s)  The format for your next appointment:   In Person  Provider:   Sanda Klein, MD     Important Information About Sugar

## 2022-01-05 ENCOUNTER — Encounter: Payer: Self-pay | Admitting: Cardiovascular Disease

## 2022-01-05 ENCOUNTER — Ambulatory Visit: Payer: PPO | Attending: Cardiovascular Disease | Admitting: Cardiovascular Disease

## 2022-01-05 VITALS — BP 130/84 | HR 64 | Ht 64.0 in | Wt 183.4 lb

## 2022-01-05 DIAGNOSIS — I491 Atrial premature depolarization: Secondary | ICD-10-CM

## 2022-01-05 DIAGNOSIS — R0609 Other forms of dyspnea: Secondary | ICD-10-CM

## 2022-01-05 DIAGNOSIS — I440 Atrioventricular block, first degree: Secondary | ICD-10-CM

## 2022-01-05 NOTE — Progress Notes (Signed)
Cardiology Office Note:    Date:  01/05/2022   ID:  Sharon Rose, DOB 1941-01-21, MRN [id number]  PCP:  Blair Heys, MD   [facility] Providers Cardiologist:  Thurmon Fair, MD Electrophysiologist:  Maurice Small, MD     Referring MD: Blair Heys, MD   Chief complaint: shortness of breath on exertion  History of Present Illness:    Sharon Rose is a 81 y.o. female with a hx of mechanical aortic valve, HTN, HLD referred for arrhythmia management.  Her primary complaint is shortness of breath with exertion. This occurs primarily when she is walking a hilly route. 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  She reports that she has had palpitations since her aortic valve surgery. A monitor was placed that showed frequent PACs and atrial runs. Second degree AV block was also reported, but I'm not 100% convinced that she is having atrial runs with a pause at the end of the run prior to a sinus or another ectopic atrial beat. She notes that she has had syncope in the distant past.  12.12.2023 Since her last visit, she has made some changes to manage symptoms from hiatal hernia -- small meals, eating slowly. Her shortness of breath has improved. She performed an exercise tolerance test, achieving > 80% MPHR. She remains free of palpitations pre-syncope, syncope.  Past Medical History:  Diagnosis Date   CAD (coronary artery disease)    NUCLEAR STRESS TEST, 10/07/2008 - no ischemia   Dyspnea on exertion    H/O aortic valve replacement    2D ECHO, 04/26/2011 - EF 50-55%, normal-mild   Hypertension    NSTEMI (non-ST elevated myocardial infarction) (HCC) 12/2004   Secondary to occlusion of OM 2, small vessel and medical therapy recommended; cath 2011 with no CAD    Past Surgical History:  Procedure Laterality Date   AORTIC VALVE REPLACEMENT  09/12/2004   St. Jude  mechanical aortic valve, model #23AK-501, serial [id number]   CARDIAC CATHETERIZATION Left 03/25/2009   Normal cors   CARDIAC CATHETERIZATION  01/20/2005   Medical management for occluded and possibly revascularized OM 2, string sign seen   CARDIAC CATHETERIZATION  08/11/2004   Elective aortic valve replacement, severe aortic valvre stenosis with functional class II to III symptomatology    Current Medications: No outpatient medications have been marked as taking for the 01/05/22 encounter (Appointment) with Ronit Marczak, Roberts Gaudy, MD.     Allergies:   Codeine, Statins, Sulfa antibiotics, and Verapamil   Social History   Socioeconomic History   Marital status: Widowed    Spouse name: Not on file   Number of children: Not on file   Years of education: Not on file   Highest education level: Not on file  Occupational History   Not on file  Tobacco Use   Smoking status: Never   Smokeless tobacco: Never  Substance and Sexual Activity   Alcohol use: No   Drug use: No   Sexual activity: Not on file  Other Topics Concern   Not on file  Social History Narrative   Not on file   Social Determinants of Health   Financial Resource Strain: Not on file  Food Insecurity: Not on file  Transportation Needs: Not on file  Physical Activity: Not on file  Stress: Not on file  Social Connections: Not on file     Family  History: The patient's family history is not on file.  ROS:   Please see the history of present illness.    All other systems reviewed and are negative.  EKGs/Labs/Other Studies Reviewed:    Exercise tolerance test November 05, 2021: Achieved max heart rate 127 bpm. (90% MPHR)  Zio patch 10/06/2021   Predominant rhythm is normal sinus rhythm with normal circadian variation.   Rare and very brief episodes of nonsustained ventricular tachycardia are seen.   There are very frequent premature atrial contractions (roughly 20% of all beats) and there are very frequent but brief  episodes of nonsustained supraventricular tachycardia (maximum 19 beats, roughly 10 seconds in duration).  The episodes of supraventricular tachycardia correlate well with the patient's symptoms.   There is no evidence of atrial fibrillation   Second-degree atrioventricular block Mobitz type I is frequently seen, both during sinus rhythm and during atrial tachycardia.  She states that  EKG:  Last EKG results: Frequent atrial ectopy, occurring in runs. Possibly a blocked PAC   Recent Labs: 10/07/2021: TSH 1.570     Physical Exam:    VS:  There were no vitals taken for this visit.    Wt Readings from Last 3 Encounters:  12/16/21 184 lb 6.4 oz (83.6 kg)  11/02/21 180 lb (81.6 kg)  09/15/21 180 lb 9.6 oz (81.9 kg)     GEN: Well nourished, well developed in no acute distress CARDIAC: Irregular rhythm, no murmurs, rubs, gallops RESPIRATORY:  Normal work of breathing MUSCULOSKELETAL: trace edema    ASSESSMENT & PLAN:    Exertional dyspnea: Not sure this is related to rhythm. Stress tolerance test shows that she has adequate chronotropic competence.  Frequent atrial ectopy: does not appear to be terribly symptomatic for her. Does not appear to be responsive to betablocker. AV block: I have not seen what I think is definitively second degree block in sinus rhythm. There have been non-conducted PACs. I would not be surprised if she has had episodes of wenckebach given the extent of her first degree block. I would not consider a class I antiarrhythmic for her without a pacemaker.     Follow-up 1 year     Medication Adjustments/Labs and Tests Ordered: Current medicines are reviewed at length with the patient today.  Concerns regarding medicines are outlined above.  No orders of the defined types were placed in this encounter.  No orders of the defined types were placed in this encounter.    Signed, Maurice Small, MD  01/05/2022 7:43 AM    [facility]

## 2022-01-05 NOTE — Patient Instructions (Signed)
Medication Instructions:  Your physician recommends that you continue on your current medications as directed. Please refer to the Current Medication list given to you today.  *If you need a refill on your cardiac medications before your next appointment, please call your pharmacy*   Follow-Up: At [facility], you and your health needs are our priority.  As part of our continuing mission to provide you with exceptional heart care, we have created designated Provider Care Teams.  These Care Teams include your primary Cardiologist (physician) and Advanced Practice Providers (APPs -  Physician Assistants and Nurse Practitioners) who all work together to provide you with the care you need, when you need it.  Your next appointment:   1 year(s)  The format for your next appointment:   In Person  Provider:   You may see Augustus E Mealor, MD or one of the following Advanced Practice Providers on your designated Care Team:   Renee Ursuy, PA-C Michael "Andy" Tillery, PA-C    Important Information About Sugar       

## 2022-01-21 ENCOUNTER — Ambulatory Visit: Payer: PPO | Attending: Cardiovascular Disease | Admitting: *Deleted

## 2022-01-21 DIAGNOSIS — D485 Neoplasm of uncertain behavior of skin: Secondary | ICD-10-CM | POA: Diagnosis not present

## 2022-01-21 DIAGNOSIS — Z7901 Long term (current) use of anticoagulants: Secondary | ICD-10-CM | POA: Diagnosis not present

## 2022-01-21 DIAGNOSIS — Z952 Presence of prosthetic heart valve: Secondary | ICD-10-CM

## 2022-01-21 DIAGNOSIS — C44319 Basal cell carcinoma of skin of other parts of face: Secondary | ICD-10-CM | POA: Diagnosis not present

## 2022-01-21 LAB — POCT INR: POC INR: 3.9

## 2022-01-21 NOTE — Patient Instructions (Signed)
Description   HOLD warfarin today and then continue same dose 1 tablet daily except 1.5 tablets each Wednesday.   Repeat INR in 3 weeks.   [facility] [phone]

## 2022-02-11 ENCOUNTER — Ambulatory Visit: Payer: PPO | Attending: Cardiovascular Disease

## 2022-02-11 ENCOUNTER — Telehealth: Payer: Self-pay

## 2022-02-11 DIAGNOSIS — Z952 Presence of prosthetic heart valve: Secondary | ICD-10-CM | POA: Diagnosis not present

## 2022-02-11 DIAGNOSIS — Z7901 Long term (current) use of anticoagulants: Secondary | ICD-10-CM

## 2022-02-11 LAB — POCT INR: INR: 4.1 — AB (ref 2.0–3.0)

## 2022-02-11 NOTE — Telephone Encounter (Signed)
Received call from pt stating she is having a small procedure at the [facility] and the office has requested her most recent INR results. Lindy and confirmed correct fax [phone]). Fax sent.

## 2022-02-11 NOTE — Patient Instructions (Signed)
Description   HOLD warfarin today and then START taking 1 tablet daily.  Stay consistent with greens each week  Repeat INR in 2 weeks.   Coumadin Clinic [phone]

## 2022-02-15 DIAGNOSIS — C44319 Basal cell carcinoma of skin of other parts of face: Secondary | ICD-10-CM | POA: Diagnosis not present

## 2022-02-26 ENCOUNTER — Ambulatory Visit: Payer: PPO | Attending: Cardiovascular Disease | Admitting: *Deleted

## 2022-02-26 DIAGNOSIS — Z952 Presence of prosthetic heart valve: Secondary | ICD-10-CM

## 2022-02-26 DIAGNOSIS — Z7901 Long term (current) use of anticoagulants: Secondary | ICD-10-CM | POA: Diagnosis not present

## 2022-02-26 LAB — POCT INR: INR: 3.4 — AB (ref 2.0–3.0)

## 2022-02-26 NOTE — Patient Instructions (Addendum)
Description   HOLD warfarin today and then START taking warfarin 1 tablet daily except 1/2 tablet on Mondays. Stay consistent with greens each week  Repeat INR in 3 weeks.   Coumadin Clinic [phone]

## 2022-03-17 DIAGNOSIS — L820 Inflamed seborrheic keratosis: Secondary | ICD-10-CM | POA: Diagnosis not present

## 2022-03-17 DIAGNOSIS — L538 Other specified erythematous conditions: Secondary | ICD-10-CM | POA: Diagnosis not present

## 2022-03-17 DIAGNOSIS — L298 Other pruritus: Secondary | ICD-10-CM | POA: Diagnosis not present

## 2022-03-19 ENCOUNTER — Ambulatory Visit: Payer: PPO | Attending: Cardiovascular Disease | Admitting: Student

## 2022-03-19 DIAGNOSIS — Z952 Presence of prosthetic heart valve: Secondary | ICD-10-CM

## 2022-03-19 DIAGNOSIS — Z7901 Long term (current) use of anticoagulants: Secondary | ICD-10-CM | POA: Diagnosis not present

## 2022-03-19 LAB — POCT INR: POC INR: 2.8

## 2022-03-19 NOTE — Patient Instructions (Addendum)
Continue taking warfarin 1 tablet daily except 1/2 tablet on Mondays. Stay consistent with greens each week  Repeat INR in 6 weeks.   C[facility]madin Clinic [phone]

## 2022-03-25 ENCOUNTER — Telehealth: Payer: Self-pay

## 2022-03-25 NOTE — Telephone Encounter (Signed)
Called and left a voice message asking patient to give our office a call to reschedule her upcoming on 04/09/22 at 8:00 AM with Dr. Sallyanne Kuster to 03/31/22

## 2022-03-31 ENCOUNTER — Ambulatory Visit: Payer: PPO | Attending: Cardiovascular Disease | Admitting: Cardiovascular Disease

## 2022-03-31 ENCOUNTER — Encounter: Payer: Self-pay | Admitting: Cardiovascular Disease

## 2022-03-31 VITALS — BP 160/84 | HR 62 | Ht 64.0 in | Wt 181.0 lb

## 2022-03-31 DIAGNOSIS — I4719 Other supraventricular tachycardia: Secondary | ICD-10-CM | POA: Diagnosis not present

## 2022-03-31 DIAGNOSIS — I441 Atrioventricular block, second degree: Secondary | ICD-10-CM | POA: Diagnosis not present

## 2022-03-31 DIAGNOSIS — Z7901 Long term (current) use of anticoagulants: Secondary | ICD-10-CM

## 2022-03-31 DIAGNOSIS — I10 Essential (primary) hypertension: Secondary | ICD-10-CM | POA: Diagnosis not present

## 2022-03-31 DIAGNOSIS — R4189 Other symptoms and signs involving cognitive functions and awareness: Secondary | ICD-10-CM | POA: Diagnosis not present

## 2022-03-31 DIAGNOSIS — Z952 Presence of prosthetic heart valve: Secondary | ICD-10-CM

## 2022-03-31 DIAGNOSIS — E7800 Pure hypercholesterolemia, unspecified: Secondary | ICD-10-CM | POA: Diagnosis not present

## 2022-03-31 NOTE — Progress Notes (Signed)
Cardiology office Note    Date:  04/03/2022   ID:  [name], DOB [birth year], MRN [id number]  PCP:  Blair Heys, MD  Cardiologist:  Thurmon Fair, MD  Electrophysiologist:  Maurice Small, MD   Evaluation Performed:  Follow-Up Visit  Chief Complaint: Dizziness History of Present Illness:    Sharon Rose is a 82 y.o. female with a mechanical aortic valve prosthesis, hypertension and hyperlipidemia.  She is doing quite well and has no cardiovascular complaints.  Recently she has been worried because she has trouble finding words and is concerned she may developing Alzheimer's disease.  She has not had any other focal neurological complaints.  She had some problems with palpitations and chest discomfort that retrospectively she thinks were related to her hiatal hernia.  If she takes her time and she was well and is slowly she does not have these complaints.  She gets a little short of breath if she has to walk a hilly route, again this is a bigger complaint if she is just eating a large meal.  She does not have orthopnea or PND or shortness of breath with household activities.  In the past, attempted treatment with higher doses of beta-blocker led to symptomatic bradycardia.  She also has a history of ankle swelling when she took higher doses of amlodipine.  Previous treatment with daily diuretics led to near syncope.  If she wears her compression stockings, she does not require diuretics.  She does not have intermittent claudication.  Her most recent echocardiogram performed in February 2023 continues to show normal left ventricular systolic function and no function of the aortic valve prosthesis.  Coronary CT angiography performed in September 2023 in response to the complaints of chest discomfort showed a calcium score of 77, less than average for age and gender, did not show any evidence of any meaningful coronary stenoses.  7-day arrhythmia monitor showed roughly 20%  PACs, nonsustained bursts of atrial tachycardia (maximum 19 beats, 10 seconds) but no atrial fibrillation.  There was also evidence of Mobitz type I second-degree AV block without significant bradycardia.  Bruce treadmill stress testing to evaluate for chronotropic incompetence showed that she was able to increase her heart rate to 141 bpm (100% of predicted).  She did have frequent PACs and may be a very brief burst of atrial fibrillation during that study.  She saw Dr. Nelly Laurence in the EP clinic who recommended observation since the PACs were not particularly symptomatic and there was evidence of AV conduction abnormalities.  She has not had any falls or serious bleeding problems.  INR is almost always in therapeutic range, most recently 2.8.  She has not had an INR less than 2.0 in the last 2 years.  She rarely has supratherapeutic INR's, max 4.5.    Metabolic control is fair with a most recent hemoglobin A1c of 5.9% and LDL of 86, HDL 53.  She has normal TSH last September.   Sharon Rose  had replacement of a severely stenotic, probably congenitally bicuspid aortic valve in 2006 with a 23 mm mechanical valve. She has HTN and dyslipidemia that is treated with a statin. Coronary angiography before surgery showed total occlusion of the mid second oblique marginal artery by preoperative coronary angiography in 2006, but no angina and no evidence of ischemia by nuclear stress testing. Repeat coronary angiography in March 2011 did not confirm occlusion of the second oblique marginal branch. She has normal LV function and no history  of embolic events. In 2014 she presented with thiazide induced hypokalemia and bradycardia. Her heart rate normalized after electrolyte correction. We tried to stop her diuretic completely, but this was associated with ankle swelling and difficult to control blood pressure. She has a history of postoperative atrial fibrillation without detected recurrence since then. Normal 30 day  event monitor in July 2017.   Past Medical History:  Diagnosis Date   CAD (coronary artery disease)    NUCLEAR STRESS TEST, 10/07/2008 - no ischemia   Dyspnea on exertion    H/O aortic valve replacement    2D ECHO, 04/26/2011 - EF 50-55%, normal-mild   Hypertension    NSTEMI (non-ST elevated myocardial infarction) (HCC) 12/2004   Secondary to occlusion of OM 2, small vessel and medical therapy recommended; cath 2011 with no CAD   Past Surgical History:  Procedure Laterality Date   AORTIC VALVE REPLACEMENT  09/12/2004   St. Jude mechanical aortic valve, model #23AK-501, serial #[id number]   CARDIAC CATHETERIZATION Left 03/25/2009   Normal cors   CARDIAC CATHETERIZATION  01/20/2005   Medical management for occluded and possibly revascularized OM 2, string sign seen   CARDIAC CATHETERIZATION  08/11/2004   Elective aortic valve replacement, severe aortic valvre stenosis with functional class II to III symptomatology     Current Meds  Medication Sig   amLODipine (NORVASC) 2.5 MG tablet TAKE 1 TABLET BY MOUTH EVERY DAY   aspirin EC 81 MG tablet Take 81 mg by mouth daily.   calcium carbonate (OS-CAL) 600 MG TABS Take 600 mg by mouth 2 (two) times daily with a meal.   losartan (COZAAR) 100 MG tablet TAKE 1 TABLET BY MOUTH EVERY DAY (STOP TAKING THE LOSARTAN-HCTZ COMBO TAB)   metoprolol succinate (TOPROL-XL) 25 MG 24 hr tablet Take 12.5 mg by mouth daily.   Multiple Vitamin (MULTIVITAMIN) tablet Take 1 tablet by mouth daily.   omeprazole (PRILOSEC) 20 MG capsule Take 20 mg by mouth daily.   rosuvastatin (CRESTOR) 5 MG tablet Take 5 mg by mouth every evening.    warfarin (COUMADIN) 5 MG tablet TAKE 1 - 1&1/2 TABLETS BY MOUTH EVERY DAY AS DIRECTED BY COUMADIN CLINIC     Allergies:   Codeine, Statins, Sulfa antibiotics, and Verapamil   Social History   Tobacco Use   Smoking status: Never   Smokeless tobacco: Never  Substance Use Topics   Alcohol use: No   Drug use: No     Family  Hx: The patient's family history is not on file.  ROS:   Please see the history of present illness.   All other systems are reviewed and are negative.  Prior CV studies:    Labs/Other Tests and Data Reviewed:    ECHO 03/17/2021:    1. Low normal EF stable since TTE done 07/23/19. Left ventricular ejection  fraction, by estimation, is 50 to 55%. The left ventricle has low normal  function. The left ventricle has no regional wall motion abnormalities.  The left ventricular internal  cavity size was mildly dilated. There is mild left ventricular  hypertrophy. Left ventricular diastolic parameters were normal.   2. Right ventricular systolic function is normal. The right ventricular  size is normal.   3. Left atrial size was mildly dilated.   4. The mitral valve is abnormal. Trivial mitral valve regurgitation. No  evidence of mitral stenosis.   5. Trivial closing volume AR . The aortic valve has been  repaired/replaced. Aortic valve regurgitation is trivial. No aortic  stenosis is present.   6. Aortic dilatation noted. There is mild dilatation of the ascending  aorta, measuring 40 mm.   7. The inferior vena cava is normal in size with greater than 50%  respiratory variability, suggesting right atrial pressure of 3 mmHg.    AV Area (VTI):     1.47 cm  AV Vmax:           264.00 cm/s  AV Peak Grad:      27.9 mmHg  AV Mean Grad:      14.4 mmHg  LVOT/AV VTI ratio: 0.35  AI PHT:            585 msec    AORTA  Ao Root diam: 3.50 cm  Ao Asc diam:  4.00 cm   Coronary CT Angio 10/05/2021: 1. Minimal CAD in the proximal and mid LAD, <25% stenosis,  CADRADS 1.   2. Coronary calcium score is 77, which places the patient in the 44th percentile for age and sex matched control.   3. Normal coronary origins with left dominance.   4. 23 mm St. Jude mechanical aortic valve is present. Normal bileaflet disc motion.   Event monitor 10/05/2021    Predominant rhythm is normal sinus  rhythm with normal circadian variation.   Rare and very brief episodes of nonsustained ventricular tachycardia are seen.   There are very frequent premature atrial contractions (roughly 20% of all beats) and there are very frequent but brief episodes of nonsustained supraventricular tachycardia (maximum 19 beats, roughly 10 seconds in duration).  The episodes of supraventricular tachycardia correlate well with the patient's symptoms.   There is no evidence of atrial fibrillation   Second-degree atrioventricular block Mobitz type I is frequently seen, both during sinus rhythm and during atrial tachycardia.  She states that   Abnormal event monitor due to the presence of very frequent premature atrial contractions and frequent brief episodes of nonsustained atrial tachycardia.   Also seen are frequent episodes of second-degree atrioventricular block note Mobitz type I, but significant bradycardia was not noted.   Atrial fibrillation was not seen.  ECG stress test 11/05/2021    No ST deviation was noted.   Prior study available for comparison from 10/07/2008.   Exercise stress test for chronotropic competence. HR rose from 83 to 141 with exercise, however, frequent PAC's were noted and there appeared to be a short run of atrial fibrillation.  No ischemic changes were noted.     EKG: Ordered 11/02/2021 and personally reviewedsinus rhythm with a run of PAT or possibly Mobitz 1 AV block; 09/14/2021 - sinus rhyth w 1st deg AV block  Recent Labs: 10/07/2021: TSH 1.570  12/22/2020 Creatinine 0.76, hemoglobin 13.3, potassium 3.8, ALT 12, hemoglobin A1c 5.6% Recent Lipid Panel Lab Results  Component Value Date/Time   CHOL 190 07/08/2014 10:53 AM   TRIG 188 (H) 07/08/2014 10:53 AM   HDL 46 07/08/2014 10:53 AM   CHOLHDL 4.1 07/08/2014 10:53 AM   LDLCALC 106 (H) 07/08/2014 10:53 AM   12/22/2020 Cholesterol 135, HDL 46, LDL 67, triglycerides 124  09/14/2021 Cholesterol 159, HDL 53, LDL 86,  triglycerides 111 Hemoglobin A1c 5.9% Creatinine 0.81, potassium 4.2, ALT 13  Wt Readings from Last 3 Encounters:  03/31/22 181 lb (82.1 kg)  01/05/22 183 lb 6.4 oz (83.2 kg)  12/16/21 184 lb 6.4 oz (83.6 kg)     Objective:    Vital Signs:  BP (!) 160/84 (BP Location: Left Arm, Patient Position: Sitting, Cuff Size:  Normal)   Pulse 62   Ht 5\' 4"  (1.626 m)   Wt 181 lb (82.1 kg)   SpO2 97%   BMI 31.07 kg/m     General: Alert, oriented x3, no distress, mildly obese. Head: no evidence of trauma, PERRL, EOMI, no exophtalmos or lid lag, no myxedema, no xanthelasma; normal ears, nose and oropharynx Neck: normal jugular venous pulsations and no hepatojugular reflux; brisk carotid pulses without delay and no carotid bruits Chest: clear to auscultation, no signs of consolidation by percussion or palpation, normal fremitus, symmetrical and full respiratory excursions Cardiovascular: normal position and quality of the apical impulse, regular rhythm, crisp mechanical valve clicks, 1/6 aortic ejection murmur, no diastolic murmurs, rubs or gallops Abdomen: no tenderness or distention, no masses by palpation, no abnormal pulsatility or arterial bruits, normal bowel sounds, no hepatosplenomegaly Extremities: no clubbing, cyanosis or edema; 2+ radial, ulnar and brachial pulses bilaterally; 2+ right femoral, posterior tibial and dorsalis pedis pulses; 2+ left femoral, posterior tibial and dorsalis pedis pulses; no subclavian or femoral bruits Neurological: grossly nonfocal Psych: Normal mood and affect     ASSESSMENT & PLAN:    1. Second degree atrioventricular block, Mobitz (type) I   2. H/O aortic valve replacement   3. PAT (paroxysmal atrial tachycardia)   4. Long term current use of anticoagulant therapy   5. Essential hypertension   6. Hypercholesterolemia   7. Cognitive changes        2nd deg AVB, MT1: Has had problems with long AV conduction times for a while and now has evidence  of Wenckebach cycles.  No severe bradycardia.  No indication for pacemaker implantation at this time.  Avoid high doses of beta-blocker. PAT: Very frequent PACs (20% of all QRS complexes and brief runs of nonsustained atrial tachycardia (maximum 19 beats).  These are minimally symptomatic and more aggressive treatment would likely lead to symptomatic AV block.  Evaluated by EP, monitoring recommended. Mechanical AVR: Normal AVR function, scheduled for echo later this month. Warfarin anticoagulation: Almost always well within therapeutic range.  No bleeding complications. HTN: Good blood pressure control on the current combination.  Note history of edema with higher doses of amlodipine, symptomatic bradycardia with beta-blockers and near syncope with diuretic use.  Continue same medications. HLP: LDL cholesterol less than 784 is acceptable in this patient that has no significant CAD/PAD and has a calcium score that is less than average for age and gender.  I think it is perfectly reasonable to even stop the statin for several months or indefinitely if there is concern that it is causing cognitive issues. Cognitive changes: She reports difficulty finding words.   I did not detect any findings today that would suggest that she truly has early dementia, but I recommended that she has a formal Mini-Mental status examination.   Patient Instructions  Medication Instructions:  No changes *If you need a refill on your cardiac medications before your next appointment, please call your pharmacy*  Testing/Procedures: Your physician has requested that you have an echocardiogram in 11 months- before 12 month follow up appt. Echocardiography is a painless test that uses sound waves to create images of your heart. It provides your doctor with information about the size and shape of your heart and how well your heart's chambers and valves are working. This procedure takes approximately one hour. There are no  restrictions for this procedure. Please do NOT wear cologne, perfume, aftershave, or lotions (deodorant is allowed). Please arrive 15 minutes prior to  your appointment time.    Follow-Up: At [facility], you and your health needs are our priority.  As part of our continuing mission to provide you with exceptional heart care, we have created designated Provider Care Teams.  These Care Teams include your primary Cardiologist (physician) and Advanced Practice Providers (APPs -  Physician Assistants and Nurse Practitioners) who all work together to provide you with the care you need, when you need it.  We recommend signing up for the patient portal called "MyChart".  Sign up information is provided on this After Visit Summary.  MyChart is used to connect with patients for Virtual Visits (Telemedicine).  Patients are able to view lab/test results, encounter notes, upcoming appointments, etc.  Non-urgent messages can be sent to your provider as well.   To learn more about what you can do with MyChart, go to [email or web address].    Your next appointment:   1 year(s)  Provider:   Thurmon Fair, MD       Signed, Thurmon Fair, MD  04/03/2022 6:21 PM    [facility]

## 2022-03-31 NOTE — Patient Instructions (Signed)
Medication Instructions:  No changes *If you need a refill on your cardiac medications before your next appointment, please call your pharmacy*  Testing/Procedures: Your physician has requested that you have an echocardiogram in 11 months- before 12 month follow up appt. Echocardiography is a painless test that uses sound waves to create images of your heart. It provides your doctor with information about the size and shape of your heart and how well your heart's chambers and valves are working. This procedure takes approximately one hour. There are no restrictions for this procedure. Please do NOT wear cologne, perfume, aftershave, or lotions (deodorant is allowed). Please arrive 15 minutes prior to your appointment time.    Follow-Up: At [facility], you and your health needs are our priority.  As part of our continuing mission to provide you with exceptional heart care, we have created designated Provider Care Teams.  These Care Teams include your primary Cardiologist (physician) and Advanced Practice Providers (APPs -  Physician Assistants and Nurse Practitioners) who all work together to provide you with the care you need, when you need it.  We recommend signing up for the patient portal called "MyChart".  Sign up information is provided on this After Visit Summary.  MyChart is used to connect with patients for Virtual Visits (Telemedicine).  Patients are able to view lab/test results, encounter notes, upcoming appointments, etc.  Non-urgent messages can be sent to your provider as well.   To learn more about what you can do with MyChart, go to [email or web address].    Your next appointment:   1 year(s)  Provider:   Sanda Klein, MD

## 2022-04-03 DIAGNOSIS — R300 Dysuria: Secondary | ICD-10-CM | POA: Diagnosis not present

## 2022-04-03 DIAGNOSIS — R319 Hematuria, unspecified: Secondary | ICD-10-CM | POA: Diagnosis not present

## 2022-04-03 DIAGNOSIS — N390 Urinary tract infection, site not specified: Secondary | ICD-10-CM | POA: Diagnosis not present

## 2022-04-06 DIAGNOSIS — I700 Atherosclerosis of aorta: Secondary | ICD-10-CM | POA: Diagnosis not present

## 2022-04-06 DIAGNOSIS — K2100 Gastro-esophageal reflux disease with esophagitis, without bleeding: Secondary | ICD-10-CM | POA: Diagnosis not present

## 2022-04-06 DIAGNOSIS — I10 Essential (primary) hypertension: Secondary | ICD-10-CM | POA: Diagnosis not present

## 2022-04-06 DIAGNOSIS — R413 Other amnesia: Secondary | ICD-10-CM | POA: Diagnosis not present

## 2022-04-06 DIAGNOSIS — I359 Nonrheumatic aortic valve disorder, unspecified: Secondary | ICD-10-CM | POA: Diagnosis not present

## 2022-04-06 DIAGNOSIS — R7303 Prediabetes: Secondary | ICD-10-CM | POA: Diagnosis not present

## 2022-04-06 DIAGNOSIS — I25118 Atherosclerotic heart disease of native coronary artery with other forms of angina pectoris: Secondary | ICD-10-CM | POA: Diagnosis not present

## 2022-04-06 DIAGNOSIS — E7800 Pure hypercholesterolemia, unspecified: Secondary | ICD-10-CM | POA: Diagnosis not present

## 2022-04-09 ENCOUNTER — Ambulatory Visit: Payer: PPO | Admitting: Cardiovascular Disease

## 2022-04-30 ENCOUNTER — Ambulatory Visit: Payer: PPO | Attending: Cardiovascular Disease | Admitting: Pharmacist

## 2022-04-30 DIAGNOSIS — Z952 Presence of prosthetic heart valve: Secondary | ICD-10-CM | POA: Diagnosis not present

## 2022-04-30 DIAGNOSIS — Z7901 Long term (current) use of anticoagulants: Secondary | ICD-10-CM

## 2022-04-30 LAB — POCT INR: INR: 2.4 (ref 2.0–3.0)

## 2022-04-30 NOTE — Patient Instructions (Signed)
Description   Continue taking warfarin 1 tablet daily except 1/2 tablet on Mondays. Stay consistent with greens each week  Repeat INR in 6 weeks.   [facility]oumadin Clinic [phone]

## 2022-05-29 ENCOUNTER — Other Ambulatory Visit: Payer: Self-pay | Admitting: Cardiovascular Disease

## 2022-06-08 ENCOUNTER — Encounter: Payer: Self-pay | Admitting: Podiatry

## 2022-06-08 ENCOUNTER — Ambulatory Visit: Payer: PPO | Admitting: Podiatry

## 2022-06-08 DIAGNOSIS — M2041 Other hammer toe(s) (acquired), right foot: Secondary | ICD-10-CM

## 2022-06-08 DIAGNOSIS — M2042 Other hammer toe(s) (acquired), left foot: Secondary | ICD-10-CM | POA: Diagnosis not present

## 2022-06-08 DIAGNOSIS — I2089 Other forms of angina pectoris: Secondary | ICD-10-CM | POA: Insufficient documentation

## 2022-06-09 NOTE — Progress Notes (Signed)
She presents today for follow-up of her arthritis bilateral foot left greater than right she states still having pain in the left foot as she was treated for before a few years ago.  Also notices toes are cramped in her shoes she has tried bigger shoe she says very really did not help she states that it seems like her feet are getting bigger as she is getting older and starts to get cramps frequently in her mid arch and her legs.  Objective: Vital signs are stable she is alert and oriented x 3.  Pulses are palpable.  Neurologic sensorium is intact Deetjen reflexes are intact muscle strength is normal symmetrical.  She does have discoloration with subungual hematomas proximal nail fold of the hallux and second digits bilaterally.  Still has a rectus foot type but she states that her foot was much more cavus type earlier on send her foot starting to flatten out.  She has some mild osteoarthritic changes and the toes with some hammertoe deformities.  Assessment: Osteoarthritis hammertoe deformities subungual hematomas.  Plan: We discussed wider tipped shoes mobic squared off toebox also wider longer shoe particularly since the older we get we do tend to flatten her arch

## 2022-06-11 ENCOUNTER — Ambulatory Visit: Payer: PPO | Attending: Cardiology

## 2022-06-11 DIAGNOSIS — Z952 Presence of prosthetic heart valve: Secondary | ICD-10-CM | POA: Diagnosis not present

## 2022-06-11 DIAGNOSIS — Z7901 Long term (current) use of anticoagulants: Secondary | ICD-10-CM

## 2022-06-11 LAB — POCT INR: INR: 3.2 — AB (ref 2.0–3.0)

## 2022-06-11 NOTE — Patient Instructions (Signed)
Continue taking warfarin 1 tablet daily except 1/2 tablet on Mondays. Stay consistent with greens each week  Repeat INR in 6 weeks.   Coumadin Clinic [phone] 

## 2022-07-14 DIAGNOSIS — R208 Other disturbances of skin sensation: Secondary | ICD-10-CM | POA: Diagnosis not present

## 2022-07-14 DIAGNOSIS — Z85828 Personal history of other malignant neoplasm of skin: Secondary | ICD-10-CM | POA: Diagnosis not present

## 2022-07-14 DIAGNOSIS — L814 Other melanin hyperpigmentation: Secondary | ICD-10-CM | POA: Diagnosis not present

## 2022-07-14 DIAGNOSIS — R58 Hemorrhage, not elsewhere classified: Secondary | ICD-10-CM | POA: Diagnosis not present

## 2022-07-14 DIAGNOSIS — D225 Melanocytic nevi of trunk: Secondary | ICD-10-CM | POA: Diagnosis not present

## 2022-07-14 DIAGNOSIS — L820 Inflamed seborrheic keratosis: Secondary | ICD-10-CM | POA: Diagnosis not present

## 2022-07-14 DIAGNOSIS — Z08 Encounter for follow-up examination after completed treatment for malignant neoplasm: Secondary | ICD-10-CM | POA: Diagnosis not present

## 2022-07-14 DIAGNOSIS — L538 Other specified erythematous conditions: Secondary | ICD-10-CM | POA: Diagnosis not present

## 2022-07-14 DIAGNOSIS — L821 Other seborrheic keratosis: Secondary | ICD-10-CM | POA: Diagnosis not present

## 2022-07-21 ENCOUNTER — Ambulatory Visit: Payer: PPO | Attending: Cardiovascular Disease

## 2022-07-21 DIAGNOSIS — Z7901 Long term (current) use of anticoagulants: Secondary | ICD-10-CM | POA: Diagnosis not present

## 2022-07-21 DIAGNOSIS — Z952 Presence of prosthetic heart valve: Secondary | ICD-10-CM | POA: Diagnosis not present

## 2022-07-21 LAB — POCT INR: INR: 2.5 (ref 2.0–3.0)

## 2022-07-21 NOTE — Patient Instructions (Signed)
Description   Continue taking warfarin 1 tablet daily except 1/2 tablet on Mondays. Stay consistent with greens each week  Repeat INR in 6 weeks.   Coumadin Clinic [phone]     

## 2022-08-18 DIAGNOSIS — Z20822 Contact with and (suspected) exposure to covid-19: Secondary | ICD-10-CM | POA: Diagnosis not present

## 2022-08-18 DIAGNOSIS — R5381 Other malaise: Secondary | ICD-10-CM | POA: Diagnosis not present

## 2022-08-18 DIAGNOSIS — R509 Fever, unspecified: Secondary | ICD-10-CM | POA: Diagnosis not present

## 2022-08-18 DIAGNOSIS — R051 Acute cough: Secondary | ICD-10-CM | POA: Diagnosis not present

## 2022-08-18 DIAGNOSIS — U071 COVID-19: Secondary | ICD-10-CM | POA: Diagnosis not present

## 2022-09-01 ENCOUNTER — Ambulatory Visit: Payer: PPO | Attending: Cardiology

## 2022-09-01 DIAGNOSIS — Z952 Presence of prosthetic heart valve: Secondary | ICD-10-CM | POA: Diagnosis not present

## 2022-09-01 DIAGNOSIS — Z7901 Long term (current) use of anticoagulants: Secondary | ICD-10-CM

## 2022-09-01 LAB — POCT INR: INR: 2.5 (ref 2.0–3.0)

## 2022-09-01 NOTE — Patient Instructions (Signed)
Description   Continue taking warfarin 1 tablet daily except 1/2 tablet on Mondays. Stay consistent with greens each week  Repeat INR in 6 weeks.   [facility]oumadin Clinic [phone]

## 2022-09-08 ENCOUNTER — Other Ambulatory Visit: Payer: Self-pay | Admitting: Cardiovascular Disease

## 2022-09-08 DIAGNOSIS — Z7901 Long term (current) use of anticoagulants: Secondary | ICD-10-CM

## 2022-09-08 NOTE — Telephone Encounter (Signed)
Prescription refill request received for warfarin Lov: 03/31/22 ([facility])  Next INR check: 10/15/22 Warfarin tablet strength: 5mg   Appropriate dose. Refill sent.

## 2022-09-10 DIAGNOSIS — G43B0 Ophthalmoplegic migraine, not intractable: Secondary | ICD-10-CM | POA: Diagnosis not present

## 2022-09-10 DIAGNOSIS — H04123 Dry eye syndrome of bilateral lacrimal glands: Secondary | ICD-10-CM | POA: Diagnosis not present

## 2022-09-10 DIAGNOSIS — Z961 Presence of intraocular lens: Secondary | ICD-10-CM | POA: Diagnosis not present

## 2022-09-10 DIAGNOSIS — H43393 Other vitreous opacities, bilateral: Secondary | ICD-10-CM | POA: Diagnosis not present

## 2022-09-10 DIAGNOSIS — H26492 Other secondary cataract, left eye: Secondary | ICD-10-CM | POA: Diagnosis not present

## 2022-09-15 ENCOUNTER — Other Ambulatory Visit: Payer: Self-pay | Admitting: Cardiovascular Disease

## 2022-10-05 DIAGNOSIS — K2100 Gastro-esophageal reflux disease with esophagitis, without bleeding: Secondary | ICD-10-CM | POA: Diagnosis not present

## 2022-10-05 DIAGNOSIS — I2510 Atherosclerotic heart disease of native coronary artery without angina pectoris: Secondary | ICD-10-CM | POA: Diagnosis not present

## 2022-10-05 DIAGNOSIS — L538 Other specified erythematous conditions: Secondary | ICD-10-CM | POA: Diagnosis not present

## 2022-10-05 DIAGNOSIS — L298 Other pruritus: Secondary | ICD-10-CM | POA: Diagnosis not present

## 2022-10-05 DIAGNOSIS — R208 Other disturbances of skin sensation: Secondary | ICD-10-CM | POA: Diagnosis not present

## 2022-10-05 DIAGNOSIS — Z0000 Encounter for general adult medical examination without abnormal findings: Secondary | ICD-10-CM | POA: Diagnosis not present

## 2022-10-05 DIAGNOSIS — Z85828 Personal history of other malignant neoplasm of skin: Secondary | ICD-10-CM | POA: Diagnosis not present

## 2022-10-05 DIAGNOSIS — R7303 Prediabetes: Secondary | ICD-10-CM | POA: Diagnosis not present

## 2022-10-05 DIAGNOSIS — I700 Atherosclerosis of aorta: Secondary | ICD-10-CM | POA: Diagnosis not present

## 2022-10-05 DIAGNOSIS — L814 Other melanin hyperpigmentation: Secondary | ICD-10-CM | POA: Diagnosis not present

## 2022-10-05 DIAGNOSIS — E7800 Pure hypercholesterolemia, unspecified: Secondary | ICD-10-CM | POA: Diagnosis not present

## 2022-10-05 DIAGNOSIS — Z23 Encounter for immunization: Secondary | ICD-10-CM | POA: Diagnosis not present

## 2022-10-05 DIAGNOSIS — R42 Dizziness and giddiness: Secondary | ICD-10-CM | POA: Diagnosis not present

## 2022-10-05 DIAGNOSIS — Z1331 Encounter for screening for depression: Secondary | ICD-10-CM | POA: Diagnosis not present

## 2022-10-05 DIAGNOSIS — Z08 Encounter for follow-up examination after completed treatment for malignant neoplasm: Secondary | ICD-10-CM | POA: Diagnosis not present

## 2022-10-05 DIAGNOSIS — I359 Nonrheumatic aortic valve disorder, unspecified: Secondary | ICD-10-CM | POA: Diagnosis not present

## 2022-10-05 DIAGNOSIS — I10 Essential (primary) hypertension: Secondary | ICD-10-CM | POA: Diagnosis not present

## 2022-10-05 DIAGNOSIS — L820 Inflamed seborrheic keratosis: Secondary | ICD-10-CM | POA: Diagnosis not present

## 2022-10-05 DIAGNOSIS — L821 Other seborrheic keratosis: Secondary | ICD-10-CM | POA: Diagnosis not present

## 2022-10-05 DIAGNOSIS — R32 Unspecified urinary incontinence: Secondary | ICD-10-CM | POA: Diagnosis not present

## 2022-10-05 LAB — LAB REPORT - SCANNED
A1c: 5.8
EGFR: 83

## 2022-10-15 ENCOUNTER — Ambulatory Visit: Payer: PPO | Attending: Cardiovascular Disease

## 2022-10-15 DIAGNOSIS — Z7901 Long term (current) use of anticoagulants: Secondary | ICD-10-CM

## 2022-10-15 DIAGNOSIS — Z952 Presence of prosthetic heart valve: Secondary | ICD-10-CM | POA: Diagnosis not present

## 2022-10-15 LAB — POCT INR: INR: 3.6 — AB (ref 2.0–3.0)

## 2022-10-15 NOTE — Patient Instructions (Signed)
Hold today only then Continue taking warfarin 1 tablet daily except 1/2 tablet on Mondays.  Stay consistent with greens each week.   Repeat INR in 4 weeks.   Coumadin Clinic [phone]

## 2022-10-19 DIAGNOSIS — Z1231 Encounter for screening mammogram for malignant neoplasm of breast: Secondary | ICD-10-CM | POA: Diagnosis not present

## 2022-10-28 DIAGNOSIS — N6321 Unspecified lump in the left breast, upper outer quadrant: Secondary | ICD-10-CM | POA: Diagnosis not present

## 2022-10-28 DIAGNOSIS — N6311 Unspecified lump in the right breast, upper outer quadrant: Secondary | ICD-10-CM | POA: Diagnosis not present

## 2022-11-05 ENCOUNTER — Other Ambulatory Visit: Payer: Self-pay

## 2022-11-05 DIAGNOSIS — Z170 Estrogen receptor positive status [ER+]: Secondary | ICD-10-CM | POA: Diagnosis not present

## 2022-11-05 DIAGNOSIS — C50411 Malignant neoplasm of upper-outer quadrant of right female breast: Secondary | ICD-10-CM | POA: Diagnosis not present

## 2022-11-05 DIAGNOSIS — C50812 Malignant neoplasm of overlapping sites of left female breast: Secondary | ICD-10-CM | POA: Diagnosis not present

## 2022-11-05 DIAGNOSIS — D360 Benign neoplasm of lymph nodes: Secondary | ICD-10-CM | POA: Diagnosis not present

## 2022-11-05 DIAGNOSIS — D0512 Intraductal carcinoma in situ of left breast: Secondary | ICD-10-CM | POA: Diagnosis not present

## 2022-11-05 DIAGNOSIS — N6021 Fibroadenosis of right breast: Secondary | ICD-10-CM | POA: Diagnosis not present

## 2022-11-05 DIAGNOSIS — C50412 Malignant neoplasm of upper-outer quadrant of left female breast: Secondary | ICD-10-CM | POA: Diagnosis not present

## 2022-11-06 ENCOUNTER — Other Ambulatory Visit: Payer: Self-pay | Admitting: Cardiovascular Disease

## 2022-11-09 ENCOUNTER — Telehealth: Payer: Self-pay | Admitting: Hematology

## 2022-11-09 LAB — SURGICAL PATHOLOGY

## 2022-11-09 NOTE — Telephone Encounter (Signed)
Spoke to patient to confirm upcoming afternoon [facility] clinic appointment on 10/23, paperwork will be sent via [employer].    Gave location and time, also informed patient that the surgeon's office would be calling as well to get information from them similar to the packet that they will be receiving so make sure to do both.  Reminded patient that all providers will be coming to the clinic to see them HERE and if they had any questions to not hesitate to reach back out to myself or their navigators.

## 2022-11-11 DIAGNOSIS — I10 Essential (primary) hypertension: Secondary | ICD-10-CM | POA: Diagnosis not present

## 2022-11-11 DIAGNOSIS — R42 Dizziness and giddiness: Secondary | ICD-10-CM | POA: Diagnosis not present

## 2022-11-12 ENCOUNTER — Ambulatory Visit: Payer: PPO

## 2022-11-15 ENCOUNTER — Encounter: Payer: Self-pay | Admitting: *Deleted

## 2022-11-15 ENCOUNTER — Ambulatory Visit: Payer: PPO | Attending: Cardiovascular Disease

## 2022-11-15 DIAGNOSIS — Z952 Presence of prosthetic heart valve: Secondary | ICD-10-CM | POA: Diagnosis not present

## 2022-11-15 DIAGNOSIS — Z7901 Long term (current) use of anticoagulants: Secondary | ICD-10-CM | POA: Diagnosis not present

## 2022-11-15 DIAGNOSIS — Z170 Estrogen receptor positive status [ER+]: Secondary | ICD-10-CM | POA: Insufficient documentation

## 2022-11-15 LAB — POCT INR: INR: 3.6 — AB (ref 2.0–3.0)

## 2022-11-15 NOTE — Patient Instructions (Signed)
Hold today only then DECREASE TO 1 tablet daily except 1/2 tablet on Mondays and Fridays.  Stay consistent with greens each week.   Repeat INR in 4 weeks.   Coumadin Clinic [phone]

## 2022-11-17 ENCOUNTER — Encounter: Payer: Self-pay | Admitting: General Practice

## 2022-11-17 ENCOUNTER — Other Ambulatory Visit: Payer: Self-pay | Admitting: Surgery

## 2022-11-17 ENCOUNTER — Telehealth (HOSPITAL_BASED_OUTPATIENT_CLINIC_OR_DEPARTMENT_OTHER): Payer: Self-pay | Admitting: *Deleted

## 2022-11-17 ENCOUNTER — Inpatient Hospital Stay: Payer: PPO | Attending: Hematology

## 2022-11-17 ENCOUNTER — Ambulatory Visit: Payer: PPO | Admitting: Physical Therapy

## 2022-11-17 ENCOUNTER — Encounter: Payer: Self-pay | Admitting: Hematology

## 2022-11-17 ENCOUNTER — Ambulatory Visit
Admission: RE | Admit: 2022-11-17 | Discharge: 2022-11-17 | Disposition: A | Discharge status: Home / Self Care | Payer: PPO | Source: Ambulatory Visit | Attending: Radiation Oncology | Admitting: Radiation Oncology

## 2022-11-17 ENCOUNTER — Inpatient Hospital Stay: Payer: PPO | Admitting: Genetic Counselor

## 2022-11-17 ENCOUNTER — Inpatient Hospital Stay: Payer: PPO | Admitting: Hematology

## 2022-11-17 VITALS — BP 162/76 | HR 65 | Temp 98.1°F | Resp 18 | Ht 64.0 in | Wt 176.4 lb

## 2022-11-17 DIAGNOSIS — C50412 Malignant neoplasm of upper-outer quadrant of left female breast: Secondary | ICD-10-CM

## 2022-11-17 DIAGNOSIS — Z170 Estrogen receptor positive status [ER+]: Secondary | ICD-10-CM

## 2022-11-17 DIAGNOSIS — Z79899 Other long term (current) drug therapy: Secondary | ICD-10-CM | POA: Diagnosis not present

## 2022-11-17 DIAGNOSIS — N6489 Other specified disorders of breast: Secondary | ICD-10-CM

## 2022-11-17 DIAGNOSIS — C50811 Malignant neoplasm of overlapping sites of right female breast: Secondary | ICD-10-CM

## 2022-11-17 DIAGNOSIS — Z803 Family history of malignant neoplasm of breast: Secondary | ICD-10-CM | POA: Insufficient documentation

## 2022-11-17 DIAGNOSIS — Z888 Allergy status to other drugs, medicaments and biological substances status: Secondary | ICD-10-CM | POA: Diagnosis not present

## 2022-11-17 DIAGNOSIS — Z7901 Long term (current) use of anticoagulants: Secondary | ICD-10-CM

## 2022-11-17 DIAGNOSIS — Z90710 Acquired absence of both cervix and uterus: Secondary | ICD-10-CM | POA: Diagnosis not present

## 2022-11-17 DIAGNOSIS — Z1721 Progesterone receptor positive status: Secondary | ICD-10-CM | POA: Insufficient documentation

## 2022-11-17 DIAGNOSIS — Z882 Allergy status to sulfonamides status: Secondary | ICD-10-CM | POA: Diagnosis not present

## 2022-11-17 DIAGNOSIS — Z885 Allergy status to narcotic agent status: Secondary | ICD-10-CM | POA: Diagnosis not present

## 2022-11-17 DIAGNOSIS — I252 Old myocardial infarction: Secondary | ICD-10-CM | POA: Diagnosis not present

## 2022-11-17 DIAGNOSIS — N6325 Unspecified lump in the left breast, overlapping quadrants: Secondary | ICD-10-CM | POA: Insufficient documentation

## 2022-11-17 DIAGNOSIS — I10 Essential (primary) hypertension: Secondary | ICD-10-CM | POA: Diagnosis not present

## 2022-11-17 DIAGNOSIS — I2510 Atherosclerotic heart disease of native coronary artery without angina pectoris: Secondary | ICD-10-CM

## 2022-11-17 DIAGNOSIS — C50411 Malignant neoplasm of upper-outer quadrant of right female breast: Secondary | ICD-10-CM | POA: Diagnosis not present

## 2022-11-17 DIAGNOSIS — N6322 Unspecified lump in the left breast, upper inner quadrant: Secondary | ICD-10-CM

## 2022-11-17 DIAGNOSIS — Z1732 Human epidermal growth factor receptor 2 negative status: Secondary | ICD-10-CM | POA: Diagnosis not present

## 2022-11-17 DIAGNOSIS — C50912 Malignant neoplasm of unspecified site of left female breast: Secondary | ICD-10-CM | POA: Diagnosis not present

## 2022-11-17 DIAGNOSIS — C50911 Malignant neoplasm of unspecified site of right female breast: Secondary | ICD-10-CM | POA: Diagnosis not present

## 2022-11-17 LAB — CBC WITH DIFFERENTIAL (CANCER CENTER ONLY)
Abs Immature Granulocytes: 0.01 10*3/uL (ref 0.00–0.07)
Basophils Absolute: 0.1 10*3/uL (ref 0.0–0.1)
Basophils Relative: 1 %
Eosinophils Absolute: 0.1 10*3/uL (ref 0.0–0.5)
Eosinophils Relative: 1 %
HCT: 37.6 % (ref 36.0–46.0)
Hemoglobin: 12.9 g/dL (ref 12.0–15.0)
Immature Granulocytes: 0 %
Lymphocytes Relative: 25 %
Lymphs Abs: 1.4 10*3/uL (ref 0.7–4.0)
MCH: 31.9 pg (ref 26.0–34.0)
MCHC: 34.3 g/dL (ref 30.0–36.0)
MCV: 93.1 fL (ref 80.0–100.0)
Monocytes Absolute: 0.4 10*3/uL (ref 0.1–1.0)
Monocytes Relative: 7 %
Neutro Abs: 3.8 10*3/uL (ref 1.7–7.7)
Neutrophils Relative %: 66 %
Platelet Count: 253 10*3/uL (ref 150–400)
RBC: 4.04 MIL/uL (ref 3.87–5.11)
RDW: 12.6 % (ref 11.5–15.5)
WBC Count: 5.7 10*3/uL (ref 4.0–10.5)
nRBC: 0 % (ref 0.0–0.2)

## 2022-11-17 LAB — CMP (CANCER CENTER ONLY)
ALT: 13 U/L (ref 0–44)
AST: 24 U/L (ref 15–41)
Albumin: 4.3 g/dL (ref 3.5–5.0)
Alkaline Phosphatase: 68 U/L (ref 38–126)
Anion gap: 3 — ABNORMAL LOW (ref 5–15)
BUN: 21 mg/dL (ref 8–23)
CO2: 32 mmol/L (ref 22–32)
Calcium: 10 mg/dL (ref 8.9–10.3)
Chloride: 104 mmol/L (ref 98–111)
Creatinine: 0.8 mg/dL (ref 0.44–1.00)
GFR, Estimated: >60 mL/min (ref >60)
Glucose, Bld: 81 mg/dL (ref 70–99)
Potassium: 4.2 mmol/L (ref 3.5–5.1)
Sodium: 139 mmol/L (ref 135–145)
Total Bilirubin: 0.7 mg/dL (ref 0.3–1.2)
Total Protein: 7 g/dL (ref 6.5–8.1)

## 2022-11-17 LAB — GENETIC SCREENING ORDER

## 2022-11-17 NOTE — Telephone Encounter (Signed)
Patient with diagnosis of mechanical AVR on warfarin for anticoagulation.    Procedure: Bilateral radioactive seed guided lumpectomies  Date of procedure: TBD  CrCl 52mL/min using adjusted body weight Platelet count 253K  Per office protocol, patient can hold warfarin for 5 days prior to procedure. Patient will not need bridging with Lovenox around procedure.  **This guidance is not considered finalized until pre-operative APP has relayed final recommendations.**

## 2022-11-17 NOTE — Telephone Encounter (Signed)
Pre-operative Risk Assessment    Patient Name: Sharon Rose  DOB: May 29, 1940 MRN: [id number]      Request for Surgical Clearance    Procedure:   Bilateral radioactive seed guided lumpectomies  Date of Surgery:  Clearance TBD                                 Surgeon:  Dr. Twana First Surgeon's Group or Practice Name:  [facility] Phone number:  [phone] Fax number:  [phone]   Type of Clearance Requested:   - Medical  - Pharmacy:  Hold Aspirin and Warfarin (Coumadin) Not Indicated.    Type of Anesthesia:  General    Additional requests/questions:    Signed, Emmit Pomfret   11/17/2022, 3:15 PM

## 2022-11-17 NOTE — Telephone Encounter (Signed)
Name: Sharon Rose  DOB: [birth year]  MRN: [id number]  Primary Cardiologist: Thurmon Fair, MD   Preoperative team, please contact this patient and set up a phone call appointment for further preoperative risk assessment. Please obtain consent and complete medication review. Thank you for your help.  I confirm that guidance regarding antiplatelet and oral anticoagulation therapy has been completed and, if necessary, noted below.  Per office protocol, patient can hold warfarin for 5 days prior to procedure. Patient will not need bridging with Lovenox around procedure.   I also confirmed the patient resides in the state of [place]. As per [facility] Medical Board telemedicine laws, the patient must reside in the state in which the provider is licensed.   Ronney Asters, NP 11/17/2022, 4:24 PM [facility]

## 2022-11-17 NOTE — Progress Notes (Signed)
[facility] Multidisciplinary Clinic Spiritual Care Note  Met with Brad and her son Christiane Ha in Breast Multidisciplinary Clinic to introduce Support Center team/resources.  she completed SDOH screening; results follow below.    SDOH Screenings   Food Insecurity: No Food Insecurity (11/17/2022)  Housing: Low Risk  (11/17/2022)  Transportation Needs: No Transportation Needs (11/17/2022)  Utilities: Not At Risk (11/17/2022)  Depression (PHQ2-9): Low Risk  (11/17/2022)  Tobacco Use: Low Risk  (11/17/2022)    Chaplain and patient discussed common feelings and emotions when being diagnosed with cancer, and the importance of support during treatment.  Chaplain informed patient of the support team and support services at [facility].  Chaplain provided contact information and encouraged patient to call with any questions or concerns.  Ms Willcutt was in good spirits, citing strong faith, good support from friends and church, and feeling at peace.  Follow up needed: No. Ms Abram is aware of ongoing Spiritual Care availability and plans to contact chaplain as needed/desired.   [address] [place], [place], [facility] Pager [phone] Voicemail [phone]

## 2022-11-17 NOTE — Progress Notes (Signed)
REFERRING PROVIDER: Malachy Mood, MD [address] [place],  [place] [address]  PRIMARY PROVIDER:  [name], DO  PRIMARY REASON FOR VISIT:  1. Malignant neoplasm of upper-outer quadrant of left breast in female, estrogen receptor positive (HCC)   2. Malignant neoplasm of overlapping sites of right breast in female, estrogen receptor positive (HCC)   3. Family history of breast cancer    HISTORY OF PRESENT ILLNESS:   Ms. Palla, a 82 y.o. female, was seen for a [place] cancer genetics consultation at the request of Dr. Mosetta Putt due to a personal and family history of bilateral breast cancer.  Ms. Figuero presents to clinic today to discuss the possibility of a hereditary predisposition to cancer, to discuss genetic testing, and to further clarify her future cancer risks, as well as potential cancer risks for family members.   In October 2024, at the age of 3, Ms. Boardman was diagnosed with bilateral breast cancer-- invasive ductal carcinoma of the right breast (ER+/PR+/HER2-) and invasive lobular carcinoma with focal invasive ductal adenocarcinoma (ER+/PR+/HER2-) . The treatment plan is pending.   CANCER HISTORY:  Oncology History  Malignant neoplasm of upper-outer quadrant of left breast in female, estrogen receptor positive (HCC)  11/05/2022 Cancer Staging   Staging form: Breast, AJCC 8th Edition - Clinical stage from 11/05/2022: Stage IA (cT1c, cN0, cM0, G2, ER+, PR+, HER2-) - Signed by Malachy Mood, MD on 11/17/2022 Stage prefix: Initial diagnosis Histologic grading system: 3 grade system   11/15/2022 Initial Diagnosis   Malignant neoplasm of upper-outer quadrant of left breast in female, estrogen receptor positive (HCC)   Malignant neoplasm of upper-outer quadrant of right breast in female, estrogen receptor positive (HCC)  11/05/2022 Cancer Staging   Staging form: Breast, AJCC 8th Edition - Clinical stage from 11/05/2022: Stage IA (cT1b, cN0, cM0, G1, ER+, PR+, HER2-) -  Signed by Malachy Mood, MD on 11/17/2022 Stage prefix: Initial diagnosis Histologic grading system: 3 grade system   11/15/2022 Initial Diagnosis   Malignant neoplasm of upper-outer quadrant of right breast in female, estrogen receptor positive (HCC)       Past Medical History:  Diagnosis Date   Breast cancer (HCC)    CAD (coronary artery disease)    NUCLEAR STRESS TEST, 10/07/2008 - no ischemia   Dyspnea on exertion    H/O aortic valve replacement    2D ECHO, 04/26/2011 - EF 50-55%, normal-mild   Hypertension    NSTEMI (non-ST elevated myocardial infarction) (HCC) 12/2004   Secondary to occlusion of OM 2, small vessel and medical therapy recommended; cath 2011 with no CAD    Past Surgical History:  Procedure Laterality Date   ABDOMINAL HYSTERECTOMY     AORTIC VALVE REPLACEMENT  09/12/2004   [employer] Jude mechanical aortic valve, model #23AK-501, serial [id number]   CARDIAC CATHETERIZATION Left 03/25/2009   Normal cors   CARDIAC CATHETERIZATION  01/20/2005   Medical management for occluded and possibly revascularized OM 2, string sign seen   CARDIAC CATHETERIZATION  08/11/2004   Elective aortic valve replacement, severe aortic valvre stenosis with functional class II to III symptomatology    FAMILY HISTORY:  We obtained a detailed, 4-generation family history.  Significant diagnoses are listed below: Family History  Problem Relation Age of Onset   Breast cancer Maternal Aunt 73    Ms. Kato is unaware of previous family history of genetic testing for hereditary cancer risks. There is no reported Ashkenazi Jewish ancestry. There is no known consanguinity.  GENETIC COUNSELING ASSESSMENT: Ms.  Roso is a 82 y.o. female with a personal history which is somewhat suggestive of a hereditary cancer syndrome and predisposition to cancer given her diagnosis of bilateral breast cancer. We, therefore, discussed and recommended the following at today's visit.   DISCUSSION: We discussed that 5  - 10% of cancer is hereditary.  Most cases of hereditary breast cancer are associated with mutations in BRCA1/2.  There are other genes that can be associated with hereditary breast cancer syndromes.  We discussed that testing is beneficial for several reasons including knowing how to follow individuals for their cancer risks and understanding if other family members could be at risk for cancer and allowing them to undergo genetic testing.   We reviewed the characteristics, features and inheritance patterns of hereditary cancer syndromes. We also discussed genetic testing, including the appropriate family members to test, the process of testing, insurance coverage and turn-around-time for results. We discussed the implications of a negative, positive, carrier and/or variant of uncertain significant result. We recommended Ms. Laban pursue genetic testing for a panel that includes genes associated with breast and other cancers.   The [facility] CancerNext+RNAinsight Panel includes sequencing, rearrangement analysis, and RNA analysis for the following 34 genes: APC, ATM, BARD1, BMPR1A, BRCA1, BRCA2, BRIP1, CDH1, CDK4, CDKN2A, CHEK2, DICER1, MLH1, MSH2, MSH6, MUTYH, NF1, NTHL1, PALB2, PMS2, PTEN, RAD51C, RAD51D, SMAD4, SMARCA4, STK11 and TP53 (sequencing and deletion/duplication); AXIN2, HOXB13, MSH3, POLD1 and POLE (sequencing only); EPCAM and GREM1 (deletion/duplication only).   Based on Ms. Demmon's personal history of bilateral breast cancer, she meets medical criteria for genetic testing. Despite that she meets criteria, she may still have an out of pocket cost. We discussed that if her out of pocket cost for testing is over $100, the laboratory should contact her and discuss the self-pay prices and/or patient pay assistance programs.    PLAN: After considering the risks, benefits, and limitations, Ms. Wildrick provided informed consent to pursue genetic testing and the blood sample was sent to [facility]  for analysis of the CancerNext+RNA Panel. Results should be available within approximately 2-3 weeks' time, at which point they will be disclosed by telephone to Ms. Mccanless, as will any additional recommendations warranted by these results. Ms. Pillado will receive a summary of her genetic counseling visit and a copy of her results once available. This information will also be available in Epic.    Ms. Coble questions were answered to her satisfaction today. Our contact information was provided should additional questions or concerns arise. Thank you for the referral and allowing [place] to share in the care of your patient.   Bonnie Overdorf M. Rennie Plowman, MS, [facility] Genetic Counselor [email or web address] (P) [phone]  The patient was seen for a total of 16 minutes in face-to-face genetic counseling.  She was accompanied by her son.  Dr. Mosetta Putt was available to discuss this case as needed.    _______________________________________________________________________ For Office Staff:  Number of people involved in session: 2 Was an Intern/ student involved with case: no

## 2022-11-17 NOTE — Progress Notes (Signed)
Radiation Oncology         [phone] ________________________________  Multidisciplinary Breast Oncology Clinic [facility]) Initial Outpatient Consultation  Name: Sharon Rose MRN: [id number]  Date: 11/17/2022  DOB: 12-24-1940  WU:[id number], Sharon Moores DO  Sharon Miyamoto, MD   REFERRING PHYSICIAN: [name], MD  DIAGNOSIS: The encounter diagnosis was Malignant neoplasm of upper-outer quadrant of left breast in female, estrogen receptor positive (HCC).  Bilateral breast cancers:   -- Stage IA (cT1c, cN0, cM0) Left Breast UOQ, Invasive lobular carcinoma with focal grade 1 IDC and focal LCIS , ER+ / PR+ / Her2-, Grade 2  -- Stage IA (cT1b, cN0, cM0) Right Breast UOQ, Invasive Ductal Carcinoma, ER+ / PR+ / Her2-, Grade 1     ICD-10-CM   1. Malignant neoplasm of upper-outer quadrant of left breast in female, estrogen receptor positive (HCC)  C50.412    Z17.0       HISTORY OF PRESENT ILLNESS::Sharon Rose is a 82 y.o. female who is presenting to the office today for evaluation of her newly diagnosed breast cancer. She is accompanied by her son. She is doing well overall.   She had routine screening mammography on 10/19/22 showing possible abnormalities in both breasts. She underwent bilateral diagnostic mammography with tomography at [facility] on 10/28/22 which showed several indeterminate masses, including: a 0.8 cm mass in the 10 o'clock right breast; a 1.5 cm mass in the 3 o'clock left breast; and a 1.5 cm distortion in the 2 o'clock left breast. Bilateral breast ultrasound on that same date further revealed: a 0.6 cm mass in the 10 o'clock right breast highly suggestive of malignancy; a 1.2 cm mass in the 3 o'clock left breast highly suggestive of malignancy; a 1.4 cm region in the 2 o'clock left breast suspicious of malignancy; and an abnormal lymph node in the right axillary tail suspicious of malignancy. No evidence of left axillary lymphadenopathy was appreciated. All of the above  findings were grater than 10 cmfn.   Biopsies collected on 11/05/22 include:  -- Biopsy of the 10 o'clock right breast showed grade 1 invasive ductal carcinoma measuring 3 mm in the greatest linear extent of the sample. Prognostic indicators significant for: estrogen receptor, 99% positive and progesterone receptor, 100% positive, both with strong staining intensity. Proliferation marker Ki67 at 5%. HER2 negative. -- Biopsy of the 3 o'clock left breast showed grade 2 invasive lobular carcinoma measuring 6.5 mm in the greatest linear extent of the sample. Prognostic indicators significant for: estrogen receptor, 100% positive and progesterone receptor, 100% positive, both with strong staining intensity. Proliferation marker Ki67 at 20%. HER2 negative. -- Biopsy of the 2 o'clock left breast showed grade 2 invasive lobular carcinoma with focal grade 1 invasive ductal carcinoma and focal LCIS. Prognostic indicators significant for: estrogen receptor, 100% positive and progesterone receptor, 40% positive, both with strong staining intensity. Proliferation marker Ki67 at 10%. HER2 negative. -- Biopsy of the right axillary lymph node came back benign.    The patient was referred today for presentation in the multidisciplinary conference.  Radiology studies and pathology slides were presented there for review and discussion of treatment options.  A consensus was discussed regarding potential next steps.  PREVIOUS RADIATION THERAPY: No  PAST MEDICAL HISTORY:  Past Medical History:  Diagnosis Date   Breast cancer (HCC)    CAD (coronary artery disease)    NUCLEAR STRESS TEST, 10/07/2008 - no ischemia   Dyspnea on exertion    H/O aortic valve replacement  2D ECHO, 04/26/2011 - EF 50-55%, normal-mild   Hypertension    NSTEMI (non-ST elevated myocardial infarction) (HCC) 12/2004   Secondary to occlusion of OM 2, small vessel and medical therapy recommended; cath 2011 with no CAD    PAST SURGICAL  HISTORY: Past Surgical History:  Procedure Laterality Date   ABDOMINAL HYSTERECTOMY     AORTIC VALVE REPLACEMENT  09/12/2004   St. Jude mechanical aortic valve, model #23AK-501, serial [id number]   CARDIAC CATHETERIZATION Left 03/25/2009   Normal cors   CARDIAC CATHETERIZATION  01/20/2005   Medical management for occluded and possibly revascularized OM 2, string sign seen   CARDIAC CATHETERIZATION  08/11/2004   Elective aortic valve replacement, severe aortic valvre stenosis with functional class II to III symptomatology    FAMILY HISTORY:  Family History  Problem Relation Age of Onset   Breast cancer Maternal Aunt 54    SOCIAL HISTORY:  Social History   Socioeconomic History   Marital status: Widowed    Spouse name: Not on file   Number of children: 1   Years of education: Not on file   Highest education level: Not on file  Occupational History   Not on file  Tobacco Use   Smoking status: Never   Smokeless tobacco: Never  Substance and Sexual Activity   Alcohol use: No   Drug use: No   Sexual activity: Not on file  Other Topics Concern   Not on file  Social History Narrative   Not on file   Social Determinants of Health   Financial Resource Strain: Not on file  Food Insecurity: No Food Insecurity (11/17/2022)   Hunger Vital Sign    Worried About Running Out of Food in the Last Year: Never true    Ran Out of Food in the Last Year: Never true  Transportation Needs: No Transportation Needs (11/17/2022)   PRAPARE - Administrator, Civil Service (Medical): No    Lack of Transportation (Non-Medical): No  Physical Activity: Not on file  Stress: Not on file  Social Connections: Not on file    ALLERGIES:  Allergies  Allergen Reactions   Clindamycin Hcl     Other Reaction(s): diarrhea   Codeine Nausea And Vomiting and Other (See Comments)    GI upset, dizziness   Gabapentin     Other Reaction(s): depression and muscle pain   Hydrocodone     Other  Reaction(s): nausea and vomiting   Nitroglycerin     Other Reaction(s): Low BP   Statins Other (See Comments)    Joint and muscle pain   Sulfa Antibiotics Nausea And Vomiting and Other (See Comments)    Gi upset, dizziness    Verapamil Other (See Comments)    Constipation    MEDICATIONS:  Current Outpatient Medications  Medication Sig Dispense Refill   aspirin EC 81 MG tablet Take 81 mg by mouth daily.     calcium carbonate (OS-CAL) 600 MG TABS Take 600 mg by mouth 2 (two) times daily with a meal.     losartan (COZAAR) 100 MG tablet TAKE 1 TABLET BY MOUTH EVERY DAY (STOP TAKING THE LOSARTAN-HCTZ COMBO TAB) 90 tablet 1   metoprolol succinate (TOPROL-XL) 25 MG 24 hr tablet TAKE 1/2 TABLET BY MOUTH EVERY DAY 45 tablet 3   Multiple Vitamin (MULTIVITAMIN) tablet Take 1 tablet by mouth daily.     omeprazole (PRILOSEC) 20 MG capsule Take 20 mg by mouth daily.     rosuvastatin (CRESTOR) 5 MG  tablet Take 5 mg by mouth every evening.      warfarin (COUMADIN) 5 MG tablet TAKE 1/2 TABLET TO 1 TABLET BY MOUTH DAILY AS DIRECTED BY COUMADIN CLINIC 90 tablet 0   No current facility-administered medications for this encounter.    REVIEW OF SYSTEMS: A 10+ POINT REVIEW OF SYSTEMS WAS OBTAINED including neurology, dermatology, psychiatry, cardiac, respiratory, lymph, extremities, GI, GU, musculoskeletal, constitutional, reproductive, HEENT. She did not fill out the provided ROS form.    PHYSICAL EXAM:    11/17/2022  Vitals with BMI   Height 5\' 4"    Weight 176 lbs 6 oz   BMI 30.26   Systolic 162 !   Diastolic 76 !   Pulse 65        Lungs are clear to auscultation bilaterally. Heart has regular rate and rhythm. No palpable cervical, supraclavicular, or axillary adenopathy. Abdomen soft, non-tender, normal bowel sounds.  Breasts:  -- Left breast with biopsy sites in the lateral aspect of the breast with significant bruising. No palpable mass, nipple discharge, or bleeding appreciated.  --  Right breast with a biopsy site present in the lateral aspect of the breast. No palpable mass, nipple discharge or bleeding appreciated.    KPS = 90  100 - Normal; no complaints; no evidence of disease. 90   - Able to carry on normal activity; minor signs or symptoms of disease. 80   - Normal activity with effort; some signs or symptoms of disease. 46   - Cares for self; unable to carry on normal activity or to do active work. 60   - Requires occasional assistance, but is able to care for most of his personal needs. 50   - Requires considerable assistance and frequent medical care. 40   - Disabled; requires special care and assistance. 30   - Severely disabled; hospital admission is indicated although death not imminent. 20   - Very sick; hospital admission necessary; active supportive treatment necessary. 10   - Moribund; fatal processes progressing rapidly. 0     - Dead  Karnofsky DA, Abelmann WH, Craver LS and Burchenal [facility] [phone]) The use of the nitrogen mustards in the palliative treatment of carcinoma: with particular reference to bronchogenic carcinoma Cancer 1 634-56  LABORATORY DATA:  Lab Results  Component Value Date   WBC 5.7 11/17/2022   HGB 12.9 11/17/2022   HCT 37.6 11/17/2022   MCV 93.1 11/17/2022   PLT 253 11/17/2022   Lab Results  Component Value Date   NA 139 11/17/2022   K 4.2 11/17/2022   CL 104 11/17/2022   CO2 32 11/17/2022   Lab Results  Component Value Date   ALT 13 11/17/2022   AST 24 11/17/2022   ALKPHOS 68 11/17/2022   BILITOT 0.7 11/17/2022    PULMONARY FUNCTION TEST:   Review Flowsheet        No data to display          RADIOGRAPHY: No results found.    IMPRESSION: Bilateral breast cancers  -- Stage IA (cT1c, cN0, cM0) Left Breast UOQ, Invasive lobular carcinoma with focal grade 1 IDC and focal LCIS , ER+ / PR+ / Her2-, Grade 2 -- Stage IA (cT1b, cN0, cM0) Right Breast UOQ, Invasive Ductal Carcinoma, ER+ / PR+ / Her2-, Grade 1  In  light of her age she may able to omit radiation therapy. We will see what her final pathology shows to make a determination as to whether she might need radiation therapy.  PLAN:  Genetics  CEM scheduled for 11/19/22 Bilateral lumpectomies  Aromatase inhibitor    ------------------------------------------------  Billie Lade, PhD, MD  This document serves as a record of services personally performed by Antony Blackbird, MD. It was created on his behalf by Neena Rhymes, a trained medical scribe. The creation of this record is based on the scribe's personal observations and the provider's statements to them. This document has been checked and approved by the attending provider.

## 2022-11-17 NOTE — Progress Notes (Addendum)
[facility]   Telephone:(336) [phone] Fax:(336) [phone]   Clinic New Consult Note   Rose Care Team: [name], DO as PCP - General (Family Medicine) Croitoru, Rachelle Hora, MD as PCP - Cardiology (Cardiology) Mealor, Roberts Gaudy, MD as PCP - Electrophysiology (Cardiology) [name], MD as Consulting Physician (General Surgery) Malachy Mood, MD as Consulting Physician (Hematology) Antony Blackbird, MD as Consulting Physician (Radiation Oncology) Pershing Proud, RN as Registered Nurse Donnelly Angelica, RN as Registered Nurse 11/17/2022  CHIEF COMPLAINTS/PURPOSE OF CONSULTATION:  Newly diagnosed bilateral breast cancer   Discussed Sharon use of AI scribe software for clinical note transcription with Sharon Rose, who gave verbal consent to proceed.  HISTORY OF PRESENTING ILLNESS:  Sharon Rose, an 82 year old with a history of heart disease and a hysterectomy in Sharon Rose early thirties, presents with a recent diagnosis of early stage breast cancer in both breasts. Sharon Rose presented to clinic with Sharon Rose son.  Sharon Rose breast cancer was discovered on screening mammogram on October 19, 2022.  It showed focal asymmetry in Sharon right breast and architectural distortion in Sharon left breast.  Sharon Rose underwent a diagnostic mammogram and breast ultrasound which showed 0.8 cm irregular mass in Sharon right breast 10 o'clock position, and two 1.5 cm irregular mass in Sharon left breast at 2:00 and 3 o'clock position, ultrasound also showed a slightly abnormal right axillary lymph node.  Biopsy showed invasive ductal carcinoma in right breast, grade 1, ER/PR strongly positive and HER2 negative.  Right axillary lymph node was negative for malignant cells.  2 biopsy in Sharon left breast both showed grade 2 invasive lobular carcinoma, ER/PR 100% positive, HER2 negative, Ki-67 10 to 20%.  Sharon Rose reports that Sharon biopsy procedures went well and Sharon Rose has been managing Sharon Rose heart disease with medication. Sharon Rose lives alone  and maintains an active lifestyle, including driving and doing things for herself. Sharon Rose has a family history of breast cancer, with a maternal aunt diagnosed in Sharon Rose sixties.      MEDICAL HISTORY:  Past Medical History:  Diagnosis Date   Breast cancer (HCC)    CAD (coronary artery disease)    NUCLEAR STRESS TEST, 10/07/2008 - no ischemia   Dyspnea on exertion    H/O aortic valve replacement    2D ECHO, 04/26/2011 - EF 50-55%, normal-mild   Hypertension    NSTEMI (non-ST elevated myocardial infarction) (HCC) 12/2004   Secondary to occlusion of OM 2, small vessel and medical therapy recommended; cath 2011 with no CAD    SURGICAL HISTORY: Past Surgical History:  Procedure Laterality Date   ABDOMINAL HYSTERECTOMY     AORTIC VALVE REPLACEMENT  09/12/2004   St. Jude mechanical aortic valve, model #23AK-501, serial [id number]   CARDIAC CATHETERIZATION Left 03/25/2009   Normal cors   CARDIAC CATHETERIZATION  01/20/2005   Medical management for occluded and possibly revascularized OM 2, string sign seen   CARDIAC CATHETERIZATION  08/11/2004   Elective aortic valve replacement, severe aortic valvre stenosis with functional class II to III symptomatology    SOCIAL HISTORY: Social History   Socioeconomic History   Marital status: Widowed    Spouse name: Not on file   Number of children: 1   Years of education: Not on file   Highest education level: Not on file  Occupational History   Not on file  Tobacco Use   Smoking status: Never   Smokeless tobacco: Never  Substance and Sexual Activity   Alcohol use: No   Drug use:  No   Sexual activity: Not on file  Other Topics Concern   Not on file  Social History Narrative   Not on file   Social Determinants of Health   Financial Resource Strain: Not on file  Food Insecurity: No Food Insecurity (11/17/2022)   Hunger Vital Sign    Worried About Running Out of Food in Sharon Last Year: Never true    Ran Out of Food in Sharon Last Year: Never  true  Transportation Needs: No Transportation Needs (11/17/2022)   PRAPARE - Administrator, Civil Service (Medical): No    Lack of Transportation (Non-Medical): No  Physical Activity: Not on file  Stress: Not on file  Social Connections: Not on file  Intimate Partner Violence: Not At Risk (11/17/2022)   Humiliation, Afraid, Rape, and Kick questionnaire    Fear of Current or Ex-Partner: No    Emotionally Abused: No    Physically Abused: No    Sexually Abused: No    FAMILY HISTORY: Family History  Problem Relation Age of Onset   Breast cancer Maternal Aunt 93    ALLERGIES:  is allergic to clindamycin hcl, codeine, gabapentin, hydrocodone, nitroglycerin, statins, sulfa antibiotics, and verapamil.  MEDICATIONS:  Current Outpatient Medications  Medication Sig Dispense Refill   aspirin EC 81 MG tablet Take 81 mg by mouth daily.     calcium carbonate (OS-CAL) 600 MG TABS Take 600 mg by mouth 2 (two) times daily with a meal.     losartan (COZAAR) 100 MG tablet TAKE 1 TABLET BY MOUTH EVERY DAY (STOP TAKING Sharon LOSARTAN-HCTZ COMBO TAB) 90 tablet 1   metoprolol succinate (TOPROL-XL) 25 MG 24 hr tablet TAKE 1/2 TABLET BY MOUTH EVERY DAY 45 tablet 3   Multiple Vitamin (MULTIVITAMIN) tablet Take 1 tablet by mouth daily.     omeprazole (PRILOSEC) 20 MG capsule Take 20 mg by mouth daily.     rosuvastatin (CRESTOR) 5 MG tablet Take 5 mg by mouth every evening.      warfarin (COUMADIN) 5 MG tablet TAKE 1/2 TABLET TO 1 TABLET BY MOUTH DAILY AS DIRECTED BY COUMADIN CLINIC 90 tablet 0   No current facility-administered medications for this visit.    REVIEW OF SYSTEMS:   Constitutional: Denies fevers, chills or abnormal night sweats Eyes: Denies blurriness of vision, double vision or watery eyes Ears, nose, mouth, throat, and face: Denies mucositis or sore throat Respiratory: Denies cough, dyspnea or wheezes Cardiovascular: Denies palpitation, chest discomfort or lower extremity  swelling Gastrointestinal:  Denies nausea, heartburn or change in bowel habits Skin: Denies abnormal skin rashes Lymphatics: Denies new lymphadenopathy or easy bruising Neurological:Denies numbness, tingling or new weaknesses Behavioral/Psych: Mood is stable, no new changes  All other systems were reviewed with Sharon Rose and are negative.  PHYSICAL EXAMINATION: ECOG PERFORMANCE STATUS: 1 - Symptomatic but completely ambulatory  Vitals:   11/17/22 1247  BP: (!) 162/76  Pulse: 65  Resp: 18  Temp: 98.1 F (36.7 C)  SpO2: 98%   Filed Weights   11/17/22 1247  Weight: 176 lb 6.4 oz (80 kg)    GENERAL:alert, no distress and comfortable SKIN: skin color, texture, turgor are normal, no rashes or significant lesions EYES: normal, conjunctiva are pink and non-injected, sclera clear OROPHARYNX:no exudate, no erythema and lips, buccal mucosa, and tongue normal  NECK: supple, thyroid normal size, non-tender, without nodularity LYMPH:  no palpable lymphadenopathy in Sharon cervical, axillary or inguinal LUNGS: clear to auscultation and percussion with normal breathing  effort HEART: regular rate & rhythm and no murmurs and no lower extremity edema ABDOMEN:abdomen soft, non-tender and normal bowel sounds Musculoskeletal:no cyanosis of digits and no clubbing  PSYCH: alert & oriented x 3 with fluent speech NEURO: no focal motor/sensory deficits Breasts: Breast inspection showed them to be symmetrical with no nipple discharge. (+) Ecchymosis on bilateral breast biopsy sites, more on Sharon left.  There is a tissue hardness at Sharon left breast biopsy site, palpation of Sharon right breast and axilla revealed no obvious mass that I could appreciate.   LABORATORY DATA:  I have reviewed Sharon data as listed    Latest Ref Rng & Units 11/17/2022   12:30 PM 07/01/2019   10:02 AM 07/08/2014   10:53 AM  CBC  WBC 4.0 - 10.5 K/uL 5.7  4.9  4.3   Hemoglobin 12.0 - 15.0 g/dL 33.2  95.1  88.4   Hematocrit 36.0 -  46.0 % 37.6  40.8  39.8   Platelets 150 - 400 K/uL 253  325  351       Latest Ref Rng & Units 11/17/2022   12:30 PM 07/01/2019   11:27 AM 07/01/2019   10:02 AM  CMP  Glucose 70 - 99 mg/dL 81   166   BUN 8 - 23 mg/dL 21   15   Creatinine 0.63 - 1.00 mg/dL 0.16   0.10   Sodium 932 - 145 mmol/L 139   139   Potassium 3.5 - 5.1 mmol/L 4.2   3.5   Chloride 98 - 111 mmol/L 104   98   CO2 22 - 32 mmol/L 32   26   Calcium 8.9 - 10.3 mg/dL 35.5   73.2   Total Protein 6.5 - 8.1 g/dL 7.0  7.1    Total Bilirubin 0.3 - 1.2 mg/dL 0.7  0.9    Alkaline Phos 38 - 126 U/L 68  60    AST 15 - 41 U/L 24  29    ALT 0 - 44 U/L 13  17       RADIOGRAPHIC STUDIES: I have personally reviewed Sharon radiological images as listed and agreed with Sharon findings in Sharon report. No results found.  ASSESSMENT & PLAN:  82 year old female with screening discovered a bilateral breast cancer     Bilateral Breast Cancer: Malignant neoplasm of upper-outer quadrant of left breast, multifocal, invasive lobular carcinoma cT1cN0M0, G2, malignant neoplasm of upper-outer quadrant of right breast, invasive ductal carcinoma, grade 1, all ER+/PR+/HER2- Diagnosed with two different types of early stage breast cancer in Sharon right and left breasts. Right breast has invasive ductal carcinoma and left breast has invasive lobular carcinoma. Both are ER and PR positive, HER2 negative. No lymph node involvement noted on biopsy. -Plan for surgical intervention with bilateral lumpectomies.  Due to Sharon Rose advanced age, negative lymph node, sentinel lymph node biopsy would not be performed. -Post-surgical radiation to be discussed with Dr. Roselind Messier -I recommend adjuvant antiestrogen therapy for 5 years, likely Tamoxifen due to Rose's history of arthritis.  I discussed Sharon benefit and potential side effect with Sharon Rose, Sharon Rose is interested.  Hypertension Managed with Losartan. -Continue Losartan.  Anticoagulation On Coumadin for cardiac valve  replacement. -Continue Coumadin.  Genetic Testing Qualifies for genetic testing due to personal and family history of breast cancer. -Draw blood for genetic testing.  Plan -Rose will proceed bilateral lumpectomy -Will review Sharon Rose surgical pathology to see if Sharon Rose needs adjuvant radiation -I will see Sharon Rose after surgery or radiation,  to start tamoxifen   No orders of Sharon defined types were placed in this encounter.   All questions were answered. Sharon Rose knows to call Sharon clinic with any problems, questions or concerns. I spent 40 minutes counseling Sharon Rose face to face. Sharon total time spent in Sharon appointment was 50 minutes and more than 50% was on counseling.     Malachy Mood, MD 11/17/2022 4:50 PM

## 2022-11-17 NOTE — Telephone Encounter (Signed)
Left message to call back to schedule tele pre op appt.  

## 2022-11-18 ENCOUNTER — Telehealth: Payer: Self-pay | Admitting: *Deleted

## 2022-11-18 ENCOUNTER — Encounter: Payer: Self-pay | Admitting: *Deleted

## 2022-11-18 NOTE — Telephone Encounter (Signed)
Patient is returning phone call.  °

## 2022-11-18 NOTE — Telephone Encounter (Signed)
S/w the pt and she has been scheduled for tele pre op appt 12/01/22. Med rec and consent are done.     Patient Consent for Virtual Visit        Sharon Rose has provided verbal consent on 11/18/2022 for a virtual visit (video or telephone).   CONSENT FOR VIRTUAL VISIT FOR:  Sharon Rose  By participating in this virtual visit I agree to the following:  I hereby voluntarily request, consent and authorize [facility] and its employed or contracted physicians, physician assistants, nurse practitioners or other licensed health care professionals (the Practitioner), to provide me with telemedicine health care services (the "Services") as deemed necessary by the treating Practitioner. I acknowledge and consent to receive the Services by the Practitioner via telemedicine. I understand that the telemedicine visit will involve communicating with the Practitioner through live audiovisual communication technology and the disclosure of certain medical information by electronic transmission. I acknowledge that I have been given the opportunity to request an in-person assessment or other available alternative prior to the telemedicine visit and am voluntarily participating in the telemedicine visit.  I understand that I have the right to withhold or withdraw my consent to the use of telemedicine in the course of my care at any time, without affecting my right to future care or treatment, and that the Practitioner or I may terminate the telemedicine visit at any time. I understand that I have the right to inspect all information obtained and/or recorded in the course of the telemedicine visit and may receive copies of available information for a reasonable fee.  I understand that some of the potential risks of receiving the Services via telemedicine include:  Delay or interruption in medical evaluation due to technological equipment failure or disruption; Information transmitted may not be sufficient  (e.g. poor resolution of images) to allow for appropriate medical decision making by the Practitioner; and/or  In rare instances, security protocols could fail, causing a breach of personal health information.  Furthermore, I acknowledge that it is my responsibility to provide information about my medical history, conditions and care that is complete and accurate to the best of my ability. I acknowledge that Practitioner's advice, recommendations, and/or decision may be based on factors not within their control, such as incomplete or inaccurate data provided by me or distortions of diagnostic images or specimens that may result from electronic transmissions. I understand that the practice of medicine is not an exact science and that Practitioner makes no warranties or guarantees regarding treatment outcomes. I acknowledge that a copy of this consent can be made available to me via my patient portal [facility] MyChart), or I can request a printed copy by calling the office of [facility].    I understand that my insurance will be billed for this visit.   I have read or had this consent read to me. I understand the contents of this consent, which adequately explains the benefits and risks of the Services being provided via telemedicine.  I have been provided ample opportunity to ask questions regarding this consent and the Services and have had my questions answered to my satisfaction. I give my informed consent for the services to be provided through the use of telemedicine in my medical care

## 2022-11-18 NOTE — Telephone Encounter (Signed)
Returned call to pt, left another message for pt to call back and ask for the "preop team".

## 2022-11-18 NOTE — Telephone Encounter (Signed)
Patient is returning phone call to schedule tele visit. Please advise.

## 2022-11-18 NOTE — Telephone Encounter (Signed)
S/w the pt and she has been scheduled for tele pre op appt 12/01/22. Med rec and consent are done.

## 2022-11-19 ENCOUNTER — Other Ambulatory Visit: Payer: Self-pay

## 2022-11-19 DIAGNOSIS — Z853 Personal history of malignant neoplasm of breast: Secondary | ICD-10-CM | POA: Diagnosis not present

## 2022-11-19 DIAGNOSIS — D0502 Lobular carcinoma in situ of left breast: Secondary | ICD-10-CM | POA: Diagnosis not present

## 2022-11-19 DIAGNOSIS — N6323 Unspecified lump in the left breast, lower outer quadrant: Secondary | ICD-10-CM | POA: Diagnosis not present

## 2022-11-19 DIAGNOSIS — D0511 Intraductal carcinoma in situ of right breast: Secondary | ICD-10-CM | POA: Diagnosis not present

## 2022-11-19 DIAGNOSIS — N6489 Other specified disorders of breast: Secondary | ICD-10-CM | POA: Diagnosis not present

## 2022-11-19 DIAGNOSIS — R922 Inconclusive mammogram: Secondary | ICD-10-CM | POA: Diagnosis not present

## 2022-11-22 ENCOUNTER — Telehealth: Payer: Self-pay | Admitting: *Deleted

## 2022-11-22 ENCOUNTER — Encounter: Payer: Self-pay | Admitting: *Deleted

## 2022-11-22 LAB — SURGICAL PATHOLOGY

## 2022-11-22 NOTE — Telephone Encounter (Signed)
Spoke to pt concerning [facility] from 11/17/22. Denies questions or concerns regarding dx or treatment care plan. Encourage pt to call with needs. Received verbal understanding.

## 2022-12-01 ENCOUNTER — Ambulatory Visit: Payer: PPO | Attending: Cardiology

## 2022-12-01 ENCOUNTER — Other Ambulatory Visit: Payer: Self-pay | Admitting: *Deleted

## 2022-12-01 DIAGNOSIS — Z01810 Encounter for preprocedural cardiovascular examination: Secondary | ICD-10-CM | POA: Diagnosis not present

## 2022-12-01 NOTE — Progress Notes (Signed)
Virtual Visit via Telephone Note   Because of [name]'s co-morbid illnesses, she is at least at moderate risk for complications without adequate follow up.  This format is felt to be most appropriate for this patient at this time.  The patient did not have access to video technology/had technical difficulties with video requiring transitioning to audio format only (telephone).  All issues noted in this document were discussed and addressed.  No physical exam could be performed with this format.  Please refer to the patient's chart for her consent to telehealth for [facility].  Evaluation Performed:  Preoperative cardiovascular risk assessment _____________   Date:  12/01/2022   Patient ID:  Sharon Rose, DOB April 03, 1940, MRN [id number] Patient Location:  Home Provider location:   Office  Primary Care Provider:  [name], DO Primary Cardiologist:  Thurmon Fair, MD  Chief Complaint / Patient Profile   82 y.o. y/o female with a h/o CAD s/p NSTEMI 2006, paroxysmal AF AS due to bicuspid AV s/p AVS, HTN, HLD, cognitive changes who is pending bilateral radioactive seed guided lumpectomies and presents today for telephonic preoperative cardiovascular risk assessment.  History of Present Illness    Sharon Rose is a 82 y.o. female who presents via audio/video conferencing for a telehealth visit today.  Pt was last seen in cardiology clinic on 03/31/2022 by Dr. Royann Shivers.  At that time [name] was doing well with no cardiovascular complaints.  The patient is now pending procedure as outlined above. Since her last visit, she has been doing well with no new cardiac complaints.  Her blood pressure has been controlled and most recently was 127/81.  She is still active around her home and completes the majority of her ADLs.  She denies chest pain, shortness of breath, lower extremity edema, fatigue, palpitations, melena, hematuria, hemoptysis, diaphoresis, weakness,  presyncope, syncope, orthopnea, and PND.     Past Medical History    Past Medical History:  Diagnosis Date   Breast cancer (HCC)    CAD (coronary artery disease)    NUCLEAR STRESS TEST, 10/07/2008 - no ischemia   Dyspnea on exertion    H/O aortic valve replacement    2D ECHO, 04/26/2011 - EF 50-55%, normal-mild   Hypertension    NSTEMI (non-ST elevated myocardial infarction) (HCC) 12/2004   Secondary to occlusion of OM 2, small vessel and medical therapy recommended; cath 2011 with no CAD   Past Surgical History:  Procedure Laterality Date   ABDOMINAL HYSTERECTOMY     AORTIC VALVE REPLACEMENT  09/12/2004   [employer] Jude mechanical aortic valve, model #23AK-501, serial [id number]   CARDIAC CATHETERIZATION Left 03/25/2009   Normal cors   CARDIAC CATHETERIZATION  01/20/2005   Medical management for occluded and possibly revascularized OM 2, string sign seen   CARDIAC CATHETERIZATION  08/11/2004   Elective aortic valve replacement, severe aortic valvre stenosis with functional class II to III symptomatology    Allergies  Allergies  Allergen Reactions   Clindamycin Hcl     Other Reaction(s): diarrhea   Codeine Nausea And Vomiting and Other (See Comments)    GI upset, dizziness   Gabapentin     Other Reaction(s): depression and muscle pain   Hydrocodone     Other Reaction(s): nausea and vomiting   Nitroglycerin     Other Reaction(s): Low BP   Statins Other (See Comments)    Joint and muscle pain   Sulfa Antibiotics Nausea And Vomiting and Other (See  Comments)    Gi upset, dizziness    Verapamil Other (See Comments)    Constipation    Home Medications    Prior to Admission medications   Medication Sig Start Date End Date Taking? Authorizing Provider  aspirin EC 81 MG tablet Take 81 mg by mouth daily.    [provider]  calcium carbonate (OS-CAL) 600 MG TABS Take 600 mg by mouth 2 (two) times daily with a meal.    [provider]  losartan (COZAAR) 100 MG  tablet TAKE 1 TABLET BY MOUTH EVERY DAY (STOP TAKING THE LOSARTAN-HCTZ COMBO TAB) 11/08/22   Croitoru, Mihai, MD  metoprolol succinate (TOPROL-XL) 25 MG 24 hr tablet TAKE 1/2 TABLET BY MOUTH EVERY DAY 05/31/22   Croitoru, Mihai, MD  Multiple Vitamin (MULTIVITAMIN) tablet Take 1 tablet by mouth daily.    [provider]  omeprazole (PRILOSEC) 20 MG capsule Take 20 mg by mouth daily.    [provider]  rosuvastatin (CRESTOR) 5 MG tablet Take 5 mg by mouth every evening.     [provider]  warfarin (COUMADIN) 5 MG tablet TAKE 1/2 TABLET TO 1 TABLET BY MOUTH DAILY AS DIRECTED BY COUMADIN CLINIC 09/08/22   Croitoru, Rachelle Hora, MD    Physical Exam    Vital Signs:  INDIANNA BORAN does not have vital signs available for review today.  127/82  Given telephonic nature of communication, physical exam is limited. AAOx3. NAD. Normal affect.  Speech and respirations are unlabored.  Accessory Clinical Findings    None  Assessment & Plan    1.  Preoperative Cardiovascular Risk Assessment: -Patient's RCRI score is 0.9%  The patient affirms she has been doing well without any new cardiac symptoms. They are able to achieve 6 METS without cardiac limitations. Therefore, based on ACC/AHA guidelines, the patient would be at acceptable risk for the planned procedure without further cardiovascular testing. The patient was advised that if she develops new symptoms prior to surgery to contact our office to arrange for a follow-up visit, and she verbalized understanding.   The patient was advised that if she develops new symptoms prior to surgery to contact our office to arrange for a follow-up visit, and she verbalized understanding.  Per office protocol, patient can hold warfarin for 5 days prior to procedure. Patient will not need bridging with Lovenox around procedure.   A copy of this note will be routed to requesting surgeon.  Time:   Today, I have spent 8 minutes with the patient  with telehealth technology discussing medical history, symptoms, and management plan.     Napoleon Form, Leodis Rains, NP  12/01/2022, 7:10 AM

## 2022-12-04 ENCOUNTER — Other Ambulatory Visit: Payer: Self-pay | Admitting: Cardiovascular Disease

## 2022-12-04 DIAGNOSIS — Z7901 Long term (current) use of anticoagulants: Secondary | ICD-10-CM

## 2022-12-07 ENCOUNTER — Ambulatory Visit: Payer: Self-pay | Admitting: Genetic Counselor

## 2022-12-07 ENCOUNTER — Telehealth: Payer: Self-pay | Admitting: Genetic Counselor

## 2022-12-07 ENCOUNTER — Encounter: Payer: Self-pay | Admitting: Genetic Counselor

## 2022-12-07 DIAGNOSIS — C50412 Malignant neoplasm of upper-outer quadrant of left female breast: Secondary | ICD-10-CM

## 2022-12-07 DIAGNOSIS — Z170 Estrogen receptor positive status [ER+]: Secondary | ICD-10-CM

## 2022-12-07 DIAGNOSIS — Z1379 Encounter for other screening for genetic and chromosomal anomalies: Secondary | ICD-10-CM

## 2022-12-07 DIAGNOSIS — Z803 Family history of malignant neoplasm of breast: Secondary | ICD-10-CM

## 2022-12-07 NOTE — Telephone Encounter (Signed)
Disclosed negative genetics.    

## 2022-12-08 DIAGNOSIS — H04123 Dry eye syndrome of bilateral lacrimal glands: Secondary | ICD-10-CM | POA: Diagnosis not present

## 2022-12-08 DIAGNOSIS — H26492 Other secondary cataract, left eye: Secondary | ICD-10-CM | POA: Diagnosis not present

## 2022-12-08 DIAGNOSIS — G43B0 Ophthalmoplegic migraine, not intractable: Secondary | ICD-10-CM | POA: Diagnosis not present

## 2022-12-08 DIAGNOSIS — H43393 Other vitreous opacities, bilateral: Secondary | ICD-10-CM | POA: Diagnosis not present

## 2022-12-10 ENCOUNTER — Encounter: Payer: Self-pay | Admitting: *Deleted

## 2022-12-14 NOTE — Progress Notes (Signed)
HPI:   Sharon Rose was previously seen in the [facility] clinic due to a personal history of breast cancer and concerns regarding a hereditary predisposition to cancer.    Sharon Rose recent genetic test results were disclosed to her by telephone. These results and recommendations are discussed in more detail below.  CANCER HISTORY:  Oncology History  Malignant neoplasm of upper-outer quadrant of left breast in female, estrogen receptor positive (HCC)  11/05/2022 Cancer Staging   Staging form: Breast, AJCC 8th Edition - Clinical stage from 11/05/2022: Stage IA (cT1c, cN0, cM0, G2, ER+, PR+, HER2-) - Signed by Malachy Mood, MD on 11/17/2022 Stage prefix: Initial diagnosis Histologic grading system: 3 grade system   11/15/2022 Initial Diagnosis   Malignant neoplasm of upper-outer quadrant of left breast in female, estrogen receptor positive (HCC)   12/03/2022 Genetic Testing   Negative [facility] CancerNext+RNAinsight Panel.  Report date is 12/03/2022.   The [facility] CancerNext+RNAinsight Panel includes sequencing, rearrangement analysis, and RNA analysis for the following 34 genes: APC, ATM, BARD1, BMPR1A, BRCA1, BRCA2, BRIP1, CDH1, CDK4, CDKN2A, CHEK2, DICER1, MLH1, MSH2, MSH6, MUTYH, NF1, NTHL1, PALB2, PMS2, PTEN, RAD51C, RAD51D, SMAD4, SMARCA4, STK11 and TP53 (sequencing and deletion/duplication); AXIN2, HOXB13, MSH3, POLD1 and POLE (sequencing only); EPCAM and GREM1 (deletion/duplication only).   Malignant neoplasm of upper-outer quadrant of right breast in female, estrogen receptor positive (HCC)  11/05/2022 Cancer Staging   Staging form: Breast, AJCC 8th Edition - Clinical stage from 11/05/2022: Stage IA (cT1b, cN0, cM0, G1, ER+, PR+, HER2-) - Signed by Malachy Mood, MD on 11/17/2022 Stage prefix: Initial diagnosis Histologic grading system: 3 grade system   11/15/2022 Initial Diagnosis   Malignant neoplasm of upper-outer quadrant of right breast in female, estrogen receptor  positive (HCC)   12/03/2022 Genetic Testing   Negative [facility] CancerNext+RNAinsight Panel.  Report date is 12/03/2022.   The [facility] CancerNext+RNAinsight Panel includes sequencing, rearrangement analysis, and RNA analysis for the following 34 genes: APC, ATM, BARD1, BMPR1A, BRCA1, BRCA2, BRIP1, CDH1, CDK4, CDKN2A, CHEK2, DICER1, MLH1, MSH2, MSH6, MUTYH, NF1, NTHL1, PALB2, PMS2, PTEN, RAD51C, RAD51D, SMAD4, SMARCA4, STK11 and TP53 (sequencing and deletion/duplication); AXIN2, HOXB13, MSH3, POLD1 and POLE (sequencing only); EPCAM and GREM1 (deletion/duplication only).     FAMILY HISTORY:  We obtained a detailed, 4-generation family history.  Significant diagnoses are listed below: Family History  Problem Relation Age of Onset   Breast cancer Maternal Aunt 14     Sharon Rose is unaware of previous family history of genetic testing for hereditary cancer risks. There is no reported Ashkenazi Jewish ancestry. There is no known consanguinity.    GENETIC TEST RESULTS:  The [facility] CancerNext+RNAinsight Panel found no pathogenic mutations.   The [facility] CancerNext+RNAinsight Panel includes sequencing, rearrangement analysis, and RNA analysis for the following 34 genes: APC, ATM, BARD1, BMPR1A, BRCA1, BRCA2, BRIP1, CDH1, CDK4, CDKN2A, CHEK2, DICER1, MLH1, MSH2, MSH6, MUTYH, NF1, NTHL1, PALB2, PMS2, PTEN, RAD51C, RAD51D, SMAD4, SMARCA4, STK11 and TP53 (sequencing and deletion/duplication); AXIN2, HOXB13, MSH3, POLD1 and POLE (sequencing only); EPCAM and GREM1 (deletion/duplication only).   The test report has been scanned into EPIC and is located under the Molecular Pathology section of the Results Review tab.  A portion of the result report is included below for reference. Genetic testing reported out on 12/03/2022.       Even though a pathogenic variant was not identified, possible explanations for the cancer in the family may include: There may be no hereditary risk for cancer in the family. The  cancers in Sharon Rose and/or her family may be sporadic/familial or due to other genetic and environmental factors.  Most cancer is not hereditary.  There may be a gene mutation in one of these genes that current testing methods cannot detect but that chance is small. There could be another gene that has not yet been discovered, or that we have not yet tested, that is responsible for the cancer diagnoses in the family.  It is also possible there is a hereditary cause for the cancer in the family that Sharon Rose did not inherit.   Therefore, it is important to remain in touch with cancer genetics in the future so that we can continue to offer Sharon Rose the most up to date genetic testing.    ADDITIONAL GENETIC TESTING:   Sharon Rose genetic testing was fairly extensive.  If there are additional relevant genes identified to increase cancer risk that can be analyzed in the future, we would be happy to discuss and coordinate this testing at that time.     CANCER SCREENING RECOMMENDATIONS:  Sharon Rose test result is considered negative (normal).  This means that we have not identified a hereditary cause for her personal history of breast cancer at this time.   An individual's cancer risk and medical management are not determined by genetic test results alone. Overall cancer risk assessment incorporates additional factors, including personal medical history, family history, and any available genetic information that may result in a personalized plan for cancer prevention and surveillance. Therefore, it is recommended she continue to follow the cancer management and screening guidelines provided by her oncology and primary healthcare provider.    RECOMMENDATIONS FOR FAMILY MEMBERS:   Since she did not inherit a identifiable mutation in a cancer predisposition gene included on this panel, her children could not have inherited a known mutation from her in one of these genes. Individuals in this  family might be at some increased risk of developing cancer, over the general population risk, due to the family history of cancer.  Individuals in the family should notify their providers of the family history of cancer. We recommend women in this family have a yearly mammogram beginning at age 45, or 84 years younger than the earliest onset of cancer, an annual clinical breast exam, and perform monthly breast self-exams.  Risk models that take into account family history and hormonal history may be helpful in determining appropriate breast cancer screening options for family members.   FOLLOW-UP:  Cancer genetics is a rapidly advancing field and it is possible that new genetic tests will be appropriate for her and/or her family members in the future. We encourage Ms. Pata to remain in contact with cancer genetics, so we can update her personal and family histories and let her know of advances in cancer genetics that may benefit this family.   Our contact number was provided.  They are welcome to call us at anytime with additional questions or concerns.   Zyeir Dymek M. Rennie Plowman, MS, [facility] Genetic Counselor [email or web address] (P) [phone]

## 2022-12-17 ENCOUNTER — Ambulatory Visit: Payer: PPO | Attending: Cardiovascular Disease | Admitting: *Deleted

## 2022-12-17 DIAGNOSIS — Z952 Presence of prosthetic heart valve: Secondary | ICD-10-CM | POA: Diagnosis not present

## 2022-12-17 DIAGNOSIS — Z7901 Long term (current) use of anticoagulants: Secondary | ICD-10-CM

## 2022-12-17 LAB — POCT INR: INR: 2.4 (ref 2.0–3.0)

## 2022-12-17 NOTE — Patient Instructions (Addendum)
  Description   Continue taking warfarin 1 tablet daily except 1/2 tablet on Mondays and Fridays.  Stay consistent with greens each week.   Repeat INR in 1 week post procedure.    Coumadin Clinic [phone]    Last dose of warfarin on 12/23/22, then start holding for upcoming procedure on 12/29/22 When you resume take an extra 1/2 tablet for 2 doses then resume normal dose.

## 2022-12-20 ENCOUNTER — Other Ambulatory Visit: Payer: Self-pay

## 2022-12-20 ENCOUNTER — Encounter (HOSPITAL_BASED_OUTPATIENT_CLINIC_OR_DEPARTMENT_OTHER): Payer: Self-pay | Admitting: Surgery

## 2022-12-20 DIAGNOSIS — R413 Other amnesia: Secondary | ICD-10-CM

## 2022-12-20 HISTORY — DX: Other amnesia: R41.3

## 2022-12-20 NOTE — Progress Notes (Signed)
   12/20/22 1234  PAT Phone Screen  Is the patient taking a GLP-1 receptor agonist? No  Do You Have Diabetes? No  Do You Have Hypertension? Yes  Have You Ever Been to the ER for Asthma? No  Have You Taken Oral Steroids in the Past 3 Months? No  Do you Take Phenteramine or any Other Diet Drugs? No  Recent  Lab Work, EKG, CXR? No  Do you have a history of heart problems? Yes  Cardiologist Name (S)  Dr Croitotu- Cardiac clearance (telehealth appt w/ Louanne Skye NP 12/01/22) and coumadin instrucions received. H/O AVR, HTN, MI & CAD  Have you ever had tests on your heart? (S)  Yes  What cardiac tests were performed? (S)  Cardiac Cath;Echo;EKG;Stress Test  Results viewable: (S)  [facility] Media Tab  Any Recent Hospitalizations? No  Height 5\' 5"  (1.651 m)  Weight 80 kg  Pat Appointment Scheduled (S)  Yes (PT/ INR &  EKG- coming 12/3 prior to going to [facility] for seed placement)   Pre op call done w/ patient and instructions given and reviewed. Will hold coumadin beginning Friday 11/29. Son Christiane Ha who will be her caregiver after surgery also contacted with instructions. Unless notified by Dr Eliberto Ivory office pt to continue aspirin. Toniann Fail RN at [facility] aware and will review with Dr Magnus Ivan. If hold is necessary Toniann Fail will reach out to cardiology for instruction.

## 2022-12-28 ENCOUNTER — Encounter (HOSPITAL_BASED_OUTPATIENT_CLINIC_OR_DEPARTMENT_OTHER)
Admission: RE | Admit: 2022-12-28 | Discharge: 2022-12-28 | Disposition: A | Discharge status: Home / Self Care | Payer: PPO | Source: Ambulatory Visit | Attending: Surgery | Admitting: Surgery

## 2022-12-28 DIAGNOSIS — Z87891 Personal history of nicotine dependence: Secondary | ICD-10-CM | POA: Diagnosis not present

## 2022-12-28 DIAGNOSIS — Z7901 Long term (current) use of anticoagulants: Secondary | ICD-10-CM | POA: Diagnosis not present

## 2022-12-28 DIAGNOSIS — Z952 Presence of prosthetic heart valve: Secondary | ICD-10-CM | POA: Diagnosis not present

## 2022-12-28 DIAGNOSIS — I10 Essential (primary) hypertension: Secondary | ICD-10-CM | POA: Diagnosis not present

## 2022-12-28 DIAGNOSIS — R9431 Abnormal electrocardiogram [ECG] [EKG]: Secondary | ICD-10-CM | POA: Diagnosis not present

## 2022-12-28 DIAGNOSIS — C50812 Malignant neoplasm of overlapping sites of left female breast: Secondary | ICD-10-CM | POA: Diagnosis not present

## 2022-12-28 DIAGNOSIS — K219 Gastro-esophageal reflux disease without esophagitis: Secondary | ICD-10-CM | POA: Diagnosis not present

## 2022-12-28 DIAGNOSIS — Z7982 Long term (current) use of aspirin: Secondary | ICD-10-CM | POA: Diagnosis not present

## 2022-12-28 DIAGNOSIS — Z01818 Encounter for other preprocedural examination: Secondary | ICD-10-CM | POA: Diagnosis not present

## 2022-12-28 DIAGNOSIS — Z01810 Encounter for preprocedural cardiovascular examination: Secondary | ICD-10-CM | POA: Diagnosis present

## 2022-12-28 DIAGNOSIS — Z01812 Encounter for preprocedural laboratory examination: Secondary | ICD-10-CM | POA: Diagnosis present

## 2022-12-28 DIAGNOSIS — N6325 Unspecified lump in the left breast, overlapping quadrants: Secondary | ICD-10-CM | POA: Diagnosis not present

## 2022-12-28 DIAGNOSIS — Z5181 Encounter for therapeutic drug level monitoring: Secondary | ICD-10-CM | POA: Diagnosis not present

## 2022-12-28 DIAGNOSIS — Z1721 Progesterone receptor positive status: Secondary | ICD-10-CM | POA: Diagnosis not present

## 2022-12-28 DIAGNOSIS — Z170 Estrogen receptor positive status [ER+]: Secondary | ICD-10-CM | POA: Diagnosis not present

## 2022-12-28 DIAGNOSIS — Z79899 Other long term (current) drug therapy: Secondary | ICD-10-CM | POA: Diagnosis not present

## 2022-12-28 DIAGNOSIS — I252 Old myocardial infarction: Secondary | ICD-10-CM | POA: Diagnosis not present

## 2022-12-28 DIAGNOSIS — I2510 Atherosclerotic heart disease of native coronary artery without angina pectoris: Secondary | ICD-10-CM | POA: Diagnosis not present

## 2022-12-28 DIAGNOSIS — N6321 Unspecified lump in the left breast, upper outer quadrant: Secondary | ICD-10-CM | POA: Diagnosis not present

## 2022-12-28 DIAGNOSIS — N6311 Unspecified lump in the right breast, upper outer quadrant: Secondary | ICD-10-CM | POA: Diagnosis not present

## 2022-12-28 LAB — PROTIME-INR
INR: 1.1 (ref 0.8–1.2)
Prothrombin Time: 14.5 s (ref 11.4–15.2)

## 2022-12-28 MED ORDER — CHLORHEXIDINE GLUCONATE CLOTH 2 % EX PADS: 6.0000 | MEDICATED_PAD | Freq: Once | CUTANEOUS | Status: DC

## 2022-12-28 MED ORDER — ENSURE PRE-SURGERY PO LIQD: 296.0000 mL | Freq: Once | ORAL | Status: DC

## 2022-12-28 NOTE — H&P (Signed)
REFERRING PHYSICIAN: Malachy Mood, MD PROVIDER: Wayne Both, MD MRN: [id number] DOB: 1940/02/07  Subjective   Chief Complaint: Breast Cancer  History of Present Illness: Sharon Rose is a 82 y.o. female who is seen  as an office consultation for evaluation of Breast Cancer  This is an 82 year old female who was found on recent screening mammography to have several areas suspicious in bilateral breast. On the right side she had an 8 mm mass which was at the 10 o'clock position 50 cm from the nipple. It actually measured 6 mm on ultrasound. A biopsy showed a grade 1 invasive ductal carcinoma which was 99% ER positive, 100% PR positive, HER2 negative, and had a Ki-67 of 5%. In the left breast she had a 1.2 cm mass at the 3 o'clock position 13 cm from the nipple and a 1.4 cm mass at the 2 o'clock position 10 cm from the nipple. The 3:00 mass showed a 100% ER positive, 1% PR positive, HER2 negative, and Ki-67 20%. The other mass showed 100% ER positivity, 4% PR positive, HER2 negative, and a Ki-67 of 10%. She reports having had 9 masses removed from both breast when she was in her 30s.  She is currently on Coumadin because of a mechanical valve of her heart. She is otherwise without complaints. She denies nipple discharge.  Review of Systems: A complete review of systems was obtained from the patient. I have reviewed this information and discussed as appropriate with the patient. See HPI as well for other ROS.  ROS   Medical History: Past Medical History:  Diagnosis Date  Arthritis  Heart attack (CMS/HHS-HCC)  History of cancer  Hyperlipidemia  Hypertension   Patient Active Problem List  Diagnosis  Pain of lumbar spine  CAD (coronary artery disease)  Chronic venous insufficiency  Degeneration of lumbar intervertebral disc  Essential hypertension  History of mechanical aortic valve replacement  Hypercholesterolemia  Hypokalemia  Long term current use of anticoagulant  Leg  pain  Lumbar radiculopathy  Malignant neoplasm of upper-outer quadrant of female breast (CMS/HHS-HCC)  Osteopenia  PAD (peripheral artery disease) (CMS-HCC)  Palpitations  Scoliosis deformity of spine  Spondylolisthesis, grade 1  Stable angina (CMS-HCC)  Symptomatic bradycardia  Memory loss   Past Surgical History:  Procedure Laterality Date  cardiac catheterization 08/11/2004  cardiac catheterization 01/20/2005  L cardiac catheterization 03/25/2009  heart valve  HYSTERECTOMY    Allergies  Allergen Reactions  Amlodipine Besylate Swelling  Clindamycin Hcl Diarrhea  Other Reaction(s): diarrhea  Codeine Nausea And Vomiting and Other (See Comments)  GI upset, dizziness  Diclofenac Sodium Rash  Gabapentin Muscle Pain  Other Reaction(s): depression and muscle pain  Hydrocodone Other (See Comments)  Other Reaction(s): nausea and vomiting  Nitroglycerin Other (See Comments)  Other Reaction(s): Low BP  Statins-Hmg-Coa Reductase Inhibitors Other (See Comments)  Joint and muscle pain  Sulfa (Sulfonamide Antibiotics) Nausea And Vomiting and Other (See Comments)  Gi upset, dizziness  Verapamil Other (See Comments)  Constipation  Verapamil Hcl Other (See Comments)   Current Outpatient Medications on File Prior to Visit  Medication Sig Dispense Refill  aspirin 81 MG EC tablet Take 81 mg by mouth once daily  calcium carbonate 600 mg calcium (1,500 mg) Tab tablet Take by mouth  losartan (COZAAR) 100 MG tablet TAKE 1 TABLET BY MOUTH EVERY DAY (STOP TAKING THE LOSARTAN-HCTZ COMBO TAB)  metoprolol succinate (TOPROL-XL) 25 MG XL tablet Take 25 mg by mouth once daily Takes 1/2 tablet daily  multivitamin  tablet Take 1 tablet by mouth once daily  omeprazole (PRILOSEC) 20 MG DR capsule Take 20 mg by mouth once daily  warfarin (COUMADIN) 5 MG tablet 5mg  - 5 days a week and 2.5mg  1 day a week  amLODIPine (NORVASC) 2.5 MG tablet Take 1 tablet by mouth once daily (Patient not taking: Reported  on 11/17/2022)  rosuvastatin (CRESTOR) 5 MG tablet Take 5 mg by mouth once daily   No current facility-administered medications on file prior to visit.   Family History  Problem Relation Age of Onset  Diabetes Mother  Deep vein thrombosis (DVT or abnormal blood clot formation) Mother  High blood pressure (Hypertension) Mother  Diabetes Sister    Social History   Tobacco Use  Smoking Status Former  Types: Cigarettes  Smokeless Tobacco Never    Social History   Socioeconomic History  Marital status: Widowed  Tobacco Use  Smoking status: Former  Types: Cigarettes  Smokeless tobacco: Never  Vaping Use  Vaping status: Never Used  Substance and Sexual Activity  Alcohol use: Not Currently  Drug use: Never  Sexual activity: Not Currently   Objective:  There were no vitals filed for this visit.  There is no height or weight on file to calculate BMI.  Physical Exam   She appears well on exam  She has a well-healed sternotomy incision  There is ecchymosis on both breast from her biopsies but no palpable mass in either breast and no axillary adenopathy on either side. The nipple areolar complexes are normal  Labs, Imaging and Diagnostic Testing: I have reviewed her pathology reports including her ultrasound and mammograms  Assessment and Plan:   Diagnoses and all orders for this visit:  Invasive ductal carcinoma of right breast (CMS/HHS-HCC)  Invasive lobular carcinoma of left breast in female (CMS/HHS-HCC)   We have discussed her this morning in our multidisciplinary breast cancer conference. I discussed diagnosis with the patient and her family. From a surgical treatment of breast cancer we next discussed breast conservation versus mastectomy. She is interested in breast conservation. I then discussed proceeding with a right breast radioactive seed guided lumpectomy and a left breast radioactive seed guided bracketed lumpectomy to remove all the areas of malignancy.  I explained the surgical procedure in detail. We discussed the risks which includes but is not limited to bleeding, infection, injury to surrounding structures, the need for further surgery if margins are positive, cardiopulmonary issues, postoperative recovery, etc. She will need cardiac clearance operatively and will need to come off her Coumadin for 5 days. She may need to be bridged with Lovenox by cardiology. After long discussion, she agrees to proceed with the plans

## 2022-12-28 NOTE — Progress Notes (Signed)

## 2022-12-29 ENCOUNTER — Ambulatory Visit (HOSPITAL_BASED_OUTPATIENT_CLINIC_OR_DEPARTMENT_OTHER): Payer: PPO | Admitting: Anesthesiology

## 2022-12-29 ENCOUNTER — Other Ambulatory Visit: Payer: Self-pay

## 2022-12-29 ENCOUNTER — Ambulatory Visit (HOSPITAL_BASED_OUTPATIENT_CLINIC_OR_DEPARTMENT_OTHER)
Admission: RE | Admit: 2022-12-29 | Discharge: 2022-12-29 | Disposition: A | Discharge status: Home / Self Care | Payer: PPO | Attending: Surgery | Admitting: Surgery

## 2022-12-29 ENCOUNTER — Encounter (HOSPITAL_BASED_OUTPATIENT_CLINIC_OR_DEPARTMENT_OTHER): Payer: Self-pay | Admitting: Surgery

## 2022-12-29 ENCOUNTER — Encounter (HOSPITAL_BASED_OUTPATIENT_CLINIC_OR_DEPARTMENT_OTHER)
Admission: RE | Disposition: A | Discharge status: Home / Self Care | Payer: Self-pay | Source: Home / Self Care | Attending: Surgery

## 2022-12-29 DIAGNOSIS — Z79899 Other long term (current) drug therapy: Secondary | ICD-10-CM | POA: Insufficient documentation

## 2022-12-29 DIAGNOSIS — C50411 Malignant neoplasm of upper-outer quadrant of right female breast: Secondary | ICD-10-CM | POA: Diagnosis not present

## 2022-12-29 DIAGNOSIS — C50912 Malignant neoplasm of unspecified site of left female breast: Secondary | ICD-10-CM | POA: Diagnosis not present

## 2022-12-29 DIAGNOSIS — Z1732 Human epidermal growth factor receptor 2 negative status: Secondary | ICD-10-CM | POA: Diagnosis not present

## 2022-12-29 DIAGNOSIS — C50812 Malignant neoplasm of overlapping sites of left female breast: Secondary | ICD-10-CM | POA: Insufficient documentation

## 2022-12-29 DIAGNOSIS — Z7982 Long term (current) use of aspirin: Secondary | ICD-10-CM | POA: Insufficient documentation

## 2022-12-29 DIAGNOSIS — I739 Peripheral vascular disease, unspecified: Secondary | ICD-10-CM | POA: Diagnosis not present

## 2022-12-29 DIAGNOSIS — Z952 Presence of prosthetic heart valve: Secondary | ICD-10-CM | POA: Insufficient documentation

## 2022-12-29 DIAGNOSIS — C50911 Malignant neoplasm of unspecified site of right female breast: Secondary | ICD-10-CM | POA: Diagnosis not present

## 2022-12-29 DIAGNOSIS — C50412 Malignant neoplasm of upper-outer quadrant of left female breast: Secondary | ICD-10-CM | POA: Diagnosis not present

## 2022-12-29 DIAGNOSIS — Z7901 Long term (current) use of anticoagulants: Secondary | ICD-10-CM | POA: Insufficient documentation

## 2022-12-29 DIAGNOSIS — I2510 Atherosclerotic heart disease of native coronary artery without angina pectoris: Secondary | ICD-10-CM | POA: Diagnosis not present

## 2022-12-29 DIAGNOSIS — Z1721 Progesterone receptor positive status: Secondary | ICD-10-CM | POA: Diagnosis not present

## 2022-12-29 DIAGNOSIS — Z170 Estrogen receptor positive status [ER+]: Secondary | ICD-10-CM | POA: Insufficient documentation

## 2022-12-29 DIAGNOSIS — Z87891 Personal history of nicotine dependence: Secondary | ICD-10-CM | POA: Insufficient documentation

## 2022-12-29 DIAGNOSIS — I10 Essential (primary) hypertension: Secondary | ICD-10-CM | POA: Diagnosis not present

## 2022-12-29 DIAGNOSIS — I252 Old myocardial infarction: Secondary | ICD-10-CM | POA: Insufficient documentation

## 2022-12-29 DIAGNOSIS — K219 Gastro-esophageal reflux disease without esophagitis: Secondary | ICD-10-CM | POA: Insufficient documentation

## 2022-12-29 HISTORY — PX: BREAST LUMPECTOMY WITH RADIOACTIVE SEED LOCALIZATION: SHX6424

## 2022-12-29 SURGERY — BREAST LUMPECTOMY WITH RADIOACTIVE SEED LOCALIZATION
Anesthesia: General | Site: Breast | Laterality: Bilateral

## 2022-12-29 MED ORDER — ACETAMINOPHEN 160 MG/5ML PO SOLN: 325.0000 mg | ORAL | Status: DC | PRN

## 2022-12-29 MED ORDER — MEPERIDINE HCL 25 MG/ML IJ SOLN: 6.2500 mg | INTRAMUSCULAR | Status: DC | PRN

## 2022-12-29 MED ORDER — EPHEDRINE 5 MG/ML INJ
INTRAVENOUS | Status: AC
  Filled 2022-12-29: qty 5

## 2022-12-29 MED ORDER — BUPIVACAINE-EPINEPHRINE (PF) 0.5% -1:200000 IJ SOLN
INTRAMUSCULAR | Status: AC
  Filled 2022-12-29: qty 60

## 2022-12-29 MED ORDER — PHENYLEPHRINE 80 MCG/ML (10ML) SYRINGE FOR IV PUSH (FOR BLOOD PRESSURE SUPPORT)
PREFILLED_SYRINGE | INTRAVENOUS | Status: DC | PRN
  Administered 2022-12-29: 40 ug via INTRAVENOUS

## 2022-12-29 MED ORDER — TRAMADOL HCL 50 MG PO TABS
50.0000 mg | ORAL_TABLET | Freq: Four times a day (QID) | ORAL | 0 refills | Status: AC | PRN
Start: 2022-12-29 — End: ?

## 2022-12-29 MED ORDER — LIDOCAINE 2% (20 MG/ML) 5 ML SYRINGE
INTRAMUSCULAR | Status: AC
  Filled 2022-12-29: qty 5

## 2022-12-29 MED ORDER — ACETAMINOPHEN 325 MG PO TABS: 325.0000 mg | ORAL_TABLET | ORAL | Status: DC | PRN

## 2022-12-29 MED ORDER — OXYCODONE HCL 5 MG PO TABS: 5.0000 mg | ORAL_TABLET | Freq: Once | ORAL | Status: DC | PRN

## 2022-12-29 MED ORDER — ONDANSETRON HCL 4 MG/2ML IJ SOLN
INTRAMUSCULAR | Status: AC
  Filled 2022-12-29: qty 2

## 2022-12-29 MED ORDER — ONDANSETRON HCL 4 MG/2ML IJ SOLN: 4.0000 mg | Freq: Once | INTRAMUSCULAR | Status: DC | PRN

## 2022-12-29 MED ORDER — APREPITANT 40 MG PO CAPS
40.0000 mg | ORAL_CAPSULE | ORAL | Status: AC
  Administered 2022-12-29: 40 mg via ORAL

## 2022-12-29 MED ORDER — PHENYLEPHRINE 80 MCG/ML (10ML) SYRINGE FOR IV PUSH (FOR BLOOD PRESSURE SUPPORT)
PREFILLED_SYRINGE | INTRAVENOUS | Status: AC
  Filled 2022-12-29: qty 10

## 2022-12-29 MED ORDER — ONDANSETRON HCL 4 MG/2ML IJ SOLN
INTRAMUSCULAR | Status: DC | PRN
  Administered 2022-12-29: 4 mg via INTRAVENOUS

## 2022-12-29 MED ORDER — CEFAZOLIN SODIUM-DEXTROSE 2-4 GM/100ML-% IV SOLN
2.0000 g | INTRAVENOUS | Status: AC
  Administered 2022-12-29: 2 g via INTRAVENOUS

## 2022-12-29 MED ORDER — ACETAMINOPHEN 500 MG PO TABS
1000.0000 mg | ORAL_TABLET | ORAL | Status: AC
  Administered 2022-12-29: 1000 mg via ORAL

## 2022-12-29 MED ORDER — BUPIVACAINE-EPINEPHRINE 0.5% -1:200000 IJ SOLN
INTRAMUSCULAR | Status: DC | PRN
  Administered 2022-12-29: 30 mL

## 2022-12-29 MED ORDER — PROPOFOL 10 MG/ML IV BOLUS
INTRAVENOUS | Status: DC | PRN
  Administered 2022-12-29: 150 mg via INTRAVENOUS

## 2022-12-29 MED ORDER — EPHEDRINE SULFATE-NACL 50-0.9 MG/10ML-% IV SOSY
PREFILLED_SYRINGE | INTRAVENOUS | Status: DC | PRN
  Administered 2022-12-29 (×2): 10 mg via INTRAVENOUS
  Administered 2022-12-29: 5 mg via INTRAVENOUS
  Administered 2022-12-29: 10 mg via INTRAVENOUS

## 2022-12-29 MED ORDER — CEFAZOLIN SODIUM-DEXTROSE 2-4 GM/100ML-% IV SOLN
INTRAVENOUS | Status: AC
  Filled 2022-12-29: qty 100

## 2022-12-29 MED ORDER — OXYCODONE HCL 5 MG/5ML PO SOLN: 5.0000 mg | Freq: Once | ORAL | Status: DC | PRN

## 2022-12-29 MED ORDER — APREPITANT 40 MG PO CAPS
ORAL_CAPSULE | ORAL | Status: AC
  Filled 2022-12-29: qty 1

## 2022-12-29 MED ORDER — LIDOCAINE 2% (20 MG/ML) 5 ML SYRINGE
INTRAMUSCULAR | Status: DC | PRN
  Administered 2022-12-29: 100 mg via INTRAVENOUS

## 2022-12-29 MED ORDER — FENTANYL CITRATE (PF) 100 MCG/2ML IJ SOLN
INTRAMUSCULAR | Status: AC
  Filled 2022-12-29: qty 2

## 2022-12-29 MED ORDER — DEXAMETHASONE SODIUM PHOSPHATE 10 MG/ML IJ SOLN
INTRAMUSCULAR | Status: AC
  Filled 2022-12-29: qty 1

## 2022-12-29 MED ORDER — DEXAMETHASONE SODIUM PHOSPHATE 10 MG/ML IJ SOLN
INTRAMUSCULAR | Status: DC | PRN
  Administered 2022-12-29: 10 mg via INTRAVENOUS

## 2022-12-29 MED ORDER — ACETAMINOPHEN 500 MG PO TABS
ORAL_TABLET | ORAL | Status: AC
  Filled 2022-12-29: qty 2

## 2022-12-29 MED ORDER — LACTATED RINGERS IV SOLN: INTRAVENOUS | Status: DC

## 2022-12-29 MED ORDER — SODIUM CHLORIDE 0.9 % IV SOLN
INTRAVENOUS | Status: DC | PRN
Start: 2022-12-29 — End: 2022-12-29

## 2022-12-29 MED ORDER — FENTANYL CITRATE (PF) 250 MCG/5ML IJ SOLN
INTRAMUSCULAR | Status: DC | PRN
  Administered 2022-12-29: 25 ug via INTRAVENOUS
  Administered 2022-12-29: 50 ug via INTRAVENOUS
  Administered 2022-12-29: 25 ug via INTRAVENOUS

## 2022-12-29 MED ORDER — FENTANYL CITRATE (PF) 100 MCG/2ML IJ SOLN: 25.0000 ug | INTRAMUSCULAR | Status: DC | PRN

## 2022-12-29 SURGICAL SUPPLY — 43 items
APPLIER CLIP 9.375 MED OPEN (MISCELLANEOUS) ×1 IMPLANT
BINDER BREAST 3XL (GAUZE/BANDAGES/DRESSINGS) IMPLANT
BINDER BREAST LRG (GAUZE/BANDAGES/DRESSINGS) IMPLANT
BINDER BREAST MEDIUM (GAUZE/BANDAGES/DRESSINGS) IMPLANT
BINDER BREAST XLRG (GAUZE/BANDAGES/DRESSINGS) IMPLANT
BINDER BREAST XXLRG (GAUZE/BANDAGES/DRESSINGS) IMPLANT
BLADE SURG 15 STRL LF DISP TIS (BLADE) ×1 IMPLANT
CANISTER SUC SOCK COL 7IN (MISCELLANEOUS) IMPLANT
CANISTER SUCT 1200ML W/VALVE (MISCELLANEOUS) IMPLANT
CHLORAPREP W/TINT 26 (MISCELLANEOUS) ×1 IMPLANT
CLIP APPLIE 9.375 MED OPEN (MISCELLANEOUS) IMPLANT
COVER BACK TABLE 60X90IN (DRAPES) ×1 IMPLANT
COVER MAYO STAND STRL (DRAPES) ×1 IMPLANT
COVER PROBE CYLINDRICAL 5X96 (MISCELLANEOUS) ×1 IMPLANT
DERMABOND ADVANCED .7 DNX12 (GAUZE/BANDAGES/DRESSINGS) ×1 IMPLANT
DRAPE LAPAROSCOPIC ABDOMINAL (DRAPES) ×1 IMPLANT
DRAPE UTILITY XL STRL (DRAPES) ×1 IMPLANT
ELECT REM PT RETURN 9FT ADLT (ELECTROSURGICAL) ×1 IMPLANT
ELECTRODE REM PT RTRN 9FT ADLT (ELECTROSURGICAL) ×1 IMPLANT
GAUZE SPONGE 4X4 12PLY STRL LF (GAUZE/BANDAGES/DRESSINGS) IMPLANT
GLOVE BIOGEL PI IND STRL 6.5 (GLOVE) IMPLANT
GLOVE BIOGEL PI IND STRL 7.0 (GLOVE) IMPLANT
GLOVE ECLIPSE 6.5 STRL STRAW (GLOVE) IMPLANT
GLOVE SURG SIGNA 7.5 PF LTX (GLOVE) ×1 IMPLANT
GOWN STRL REUS W/ TWL LRG LVL3 (GOWN DISPOSABLE) ×1 IMPLANT
GOWN STRL REUS W/ TWL XL LVL3 (GOWN DISPOSABLE) ×1 IMPLANT
KIT MARKER MARGIN INK (KITS) ×1 IMPLANT
NDL HYPO 25X1 1.5 SAFETY (NEEDLE) ×1 IMPLANT
NEEDLE HYPO 25X1 1.5 SAFETY (NEEDLE) ×1 IMPLANT
NS IRRIG 1000ML POUR BTL (IV SOLUTION) IMPLANT
PACK BASIN DAY SURGERY FS (CUSTOM PROCEDURE TRAY) ×1 IMPLANT
PENCIL SMOKE EVACUATOR (MISCELLANEOUS) ×1 IMPLANT
SLEEVE SCD COMPRESS KNEE MED (STOCKING) ×1 IMPLANT
SPIKE FLUID TRANSFER (MISCELLANEOUS) IMPLANT
SPONGE T-LAP 4X18 ~~LOC~~+RFID (SPONGE) ×1 IMPLANT
SUT MNCRL AB 4-0 PS2 18 (SUTURE) ×1 IMPLANT
SUT SILK 2 0 SH (SUTURE) IMPLANT
SUT VIC AB 3-0 SH 27X BRD (SUTURE) ×1 IMPLANT
SYR CONTROL 10ML LL (SYRINGE) ×1 IMPLANT
TOWEL GREEN STERILE FF (TOWEL DISPOSABLE) ×1 IMPLANT
TRAY FAXITRON CT DISP (TRAY / TRAY PROCEDURE) ×1 IMPLANT
TUBE CONNECTING 20X1/4 (TUBING) IMPLANT
YANKAUER SUCT BULB TIP NO VENT (SUCTIONS) IMPLANT

## 2022-12-29 NOTE — Op Note (Signed)
BILATERAL RADIOACTIVE SEED GUIDED LUMPECTOMIES  Procedure Note  Sharon Rose 12/29/2022   Pre-op Diagnosis: BILATERAL BREAST CANCER     Post-op Diagnosis: dame  Procedure(s): BILATERAL RADIOACTIVE SEED GUIDED LUMPECTOMIES (RIGHT BREAST X1 AND LEFT BREAST X 2)  Surgeon(s): [name], MD  Anesthesia: General  Staff:  Circulator: Raliegh Scarlet, RN Relief Scrub: Rolla Etienne Scrub Person: Maryan Rued, RN  Estimated Blood Loss: Minimal               Specimens: sent to path  Indications: This is an 82 year old female was found to have 1 mass in her right breast and 2 masses in her left breast to screen mammography.  Biopsy of the mass on the right side showed invasive ductal carcinoma.  Biopsy of the masses on the left side showed 2 areas of invasive lobular carcinoma.  The decision was made to proceed with bilateral radioactive seed guided lumpectomies to remove all the cancers  Findings: With the right lumpectomy, the skin is anterior in the posterior margin which was reexcised and sent separately goes down to the chest wall. With the 2 separate left breast cancers, the superior margin of the 3:00 cancer is the inferior margin of the 2:00 cancer  Procedure: The patient is brought to the operating identifies correct patient.  She is placed upon the operating table and general anesthesia was induced.  Her bilateral breast and axilla were then prepped and draped in usual sterile fashion.  I elected to remove the right breast cancer first.  Using neoprobe I located the radioactive seed at the 10 o'clock position and was 15 cm from the nipple.  I anesthetized the skin with Marcaine and performed an elliptical incision in this area with a scalpel.  I then dissected circumferentially down to the breast tissue with electrocautery.  With the aid of the neoprobe I then stayed widely around the radioactive seed going all the way medial to the signal and then posterior and then  completed lumpectomy staying around the signal with a radioactive seed.  1 large blood vessel in the breast had to be controlled with surgical clips as well as a 3-0 Vicryl suture ligature.  I marked all margins with paint.  The seed and clip are close to the posterior margin so I took more posterior margin all the way down to the chest wall.  This was sent separately to pathology.  I placed surgical clips around the periphery of the lumpectomy cavity.  Hemostasis peer to be achieved.  I then closed the subcutaneous tissue with interrupted 3-0 Vicryl sutures and closed the skin with a running 4-0 Monocryl. I then turned my attention toward the left breast.  I identified both radioactive seeds with the neoprobe which were at the 2 and 3 o'clock position far from the nipple areolar complex.  I made 1 incision between the 2 areas longitudinally across the breast after anesthetized skin with Marcaine.  I then dissected down to the breast tissue.  I dissected next inferiorly toward the signal from the 3:00 located seed.  I dissected widely around this area going all the way down to the chest wall.  Once I completed the lumpectomy I marked all margins with paint.  An x-ray was performed confirming the radioactive seed and previous biopsy clip were in the specimen.  I then identified again the radioactive seed at 2 o'clock position.  I began the lumpectomy including the inferior margin of the 2:00 area with what had  been the superior margin of the 3:00 lumpectomy site.  I again took the dissection all way down to the chest wall.  I then completed lumpectomy with the cautery.  Again I marked all margins with paint.  The anterior margin is skin.  An x-ray was performed confirming the final radioactive seed and biopsy clip were in the specimen.  All the tissue was sent to pathology for evaluation.  I anesthetized incision further with Marcaine and placed surgical clips around the periphery of the cavity.  I then closed the  subcutaneous tissue with interrupted 3-0 Vicryl sutures and closed the skin with a running 4-0 Monocryl.  Dermabond was then applied.  The patient tolerated the procedure well.  All the counts were correct at the end of the procedure.  The patient was then extubated in the operating room and taken in stable condition to the recovery room.  [name]   Date: 12/29/2022  Time: 3:41 PM

## 2022-12-29 NOTE — Anesthesia Preprocedure Evaluation (Addendum)
Anesthesia Evaluation  Patient identified by MRN, date of birth, ID band Patient awake    Reviewed: Allergy & Precautions, NPO status , Patient's Chart, lab work & pertinent test results  Airway Mallampati: II  TM Distance: >3 FB Neck ROM: Full    Dental  (+) Teeth Intact, Dental Advisory Given   Pulmonary neg pulmonary ROS   breath sounds clear to auscultation       Cardiovascular hypertension, Pt. on home beta blockers + CAD, + Past MI and + Peripheral Vascular Disease   Rhythm:Regular Rate:Normal  Echo:  1. Low normal EF stable since TTE done 07/23/19. Left ventricular ejection  fraction, by estimation, is 50 to 55%. The left ventricle has low normal  function. The left ventricle has no regional wall motion abnormalities.  The left ventricular internal  cavity size was mildly dilated. There is mild left ventricular  hypertrophy. Left ventricular diastolic parameters were normal.   2. Right ventricular systolic function is normal. The right ventricular  size is normal.   3. Left atrial size was mildly dilated.   4. The mitral valve is abnormal. Trivial mitral valve regurgitation. No  evidence of mitral stenosis.   5. Trivial closing volume AR . The aortic valve has been  repaired/replaced. Aortic valve regurgitation is trivial. No aortic  stenosis is present.   6. Aortic dilatation noted. There is mild dilatation of the ascending  aorta, measuring 40 mm.   7. The inferior vena cava is normal in size with greater than 50%  respiratory variability, suggesting right atrial pressure of 3 mmHg.      Neuro/Psych  PSYCHIATRIC DISORDERS       Neuromuscular disease    GI/Hepatic Neg liver ROS,GERD  Medicated,,  Endo/Other  negative endocrine ROS    Renal/GU negative Renal ROS     Musculoskeletal  (+) Arthritis ,    Abdominal   Peds  Hematology negative hematology ROS (+)   Anesthesia Other Findings    Reproductive/Obstetrics                             Anesthesia Physical Anesthesia Plan  ASA: 3  Anesthesia Plan: General   Post-op Pain Management: Tylenol PO (pre-op)*   Induction: Intravenous  PONV Risk Score and Plan: 4 or greater and Ondansetron and Treatment may vary due to age or medical condition  Airway Management Planned: LMA  Additional Equipment: None  Intra-op Plan:   Post-operative Plan: Extubation in OR  Informed Consent: I have reviewed the patients History and Physical, chart, labs and discussed the procedure including the risks, benefits and alternatives for the proposed anesthesia with the patient or authorized representative who has indicated his/her understanding and acceptance.     Dental advisory given  Plan Discussed with: CRNA  Anesthesia Plan Comments:        Anesthesia Quick Evaluation

## 2022-12-29 NOTE — Transfer of Care (Signed)
Immediate Anesthesia Transfer of Care Note  Patient: Sharon Rose  Procedure(s) Performed: BILATERAL RADIOACTIVE SEED GUIDED LUMPECTOMIES (Bilateral: Breast)  Patient Location: PACU  Anesthesia Type:General  Level of Consciousness: drowsy  Airway & Oxygen Therapy: Patient Spontanous Breathing and Patient connected to face mask oxygen  Post-op Assessment: Report given to RN and Post -op Vital signs reviewed and stable  Post vital signs: Reviewed and stable  Last Vitals:  Vitals Value Taken Time  BP 130/58 12/29/22 1545  Temp 36.4 C 12/29/22 1545  Pulse 97 12/29/22 1555  Resp 13 12/29/22 1555  SpO2 97 % 12/29/22 1555  Vitals shown include unfiled device data.  Last Pain:  Vitals:   12/29/22 1545  TempSrc:   PainSc: Asleep         Complications: No notable events documented.

## 2022-12-29 NOTE — Anesthesia Postprocedure Evaluation (Signed)
Anesthesia Post Note  Patient: Sharon Rose  Procedure(s) Performed: BILATERAL RADIOACTIVE SEED GUIDED LUMPECTOMIES (Bilateral: Breast)     Patient location during evaluation: PACU Anesthesia Type: General Level of consciousness: awake and alert Pain management: pain level controlled Vital Signs Assessment: post-procedure vital signs reviewed and stable Respiratory status: spontaneous breathing, nonlabored ventilation, respiratory function stable and patient connected to nasal cannula oxygen Cardiovascular status: blood pressure returned to baseline and stable Postop Assessment: no apparent nausea or vomiting Anesthetic complications: no   No notable events documented.  Last Vitals:  Vitals:   12/29/22 1630 12/29/22 1648  BP: 132/72 (!) 153/72  Pulse: 65 66  Resp: 14 16  Temp:  36.4 C  SpO2: 96% 96%    Last Pain:  Vitals:   12/29/22 1648  TempSrc:   PainSc: 2                  Tannia Contino

## 2022-12-29 NOTE — Discharge Instructions (Addendum)
[facility] Office Phone Number [phone]  BREAST BIOPSY/ PARTIAL MASTECTOMY: POST OP INSTRUCTIONS  Always review your discharge instruction sheet given to you by the facility where your surgery was performed.  IF YOU HAVE DISABILITY OR FAMILY LEAVE FORMS, YOU MUST BRING THEM TO THE OFFICE FOR PROCESSING.  DO NOT GIVE THEM TO YOUR DOCTOR.  A prescription for pain medication may be given to you upon discharge.  Take your pain medication as prescribed, if needed.  If narcotic pain medicine is not needed, then you may take acetaminophen (Tylenol) or ibuprofen (Advil) as needed. Take your usually prescribed medications unless otherwise directed If you need a refill on your pain medication, please contact your pharmacy.  They will contact our office to request authorization.  Prescriptions will not be filled after 5pm or on week-ends. You should eat very light the first 24 hours after surgery, such as soup, crackers, pudding, etc.  Resume your normal diet the day after surgery. Most patients will experience some swelling and bruising in the breast.  Ice packs and a good support bra will help.  Swelling and bruising can take several days to resolve.  It is common to experience some constipation if taking pain medication after surgery.  Increasing fluid intake and taking a stool softener will usually help or prevent this problem from occurring.  A mild laxative (Milk of Magnesia or Miralax) should be taken according to package directions if there are no bowel movements after 48 hours. Unless discharge instructions indicate otherwise, you may remove your bandages 24-48 hours after surgery, and you may shower at that time.  You may have steri-strips (small skin tapes) in place directly over the incision.  These strips should be left on the skin for 7-10 days.  If your surgeon used skin glue on the incision, you may shower in 24 hours.  The glue will flake off over the next 2-3 weeks.  Any  sutures or staples will be removed at the office during your follow-up visit. ACTIVITIES:  You may resume regular daily activities (gradually increasing) beginning the next day.  Wearing a good support bra or sports bra minimizes pain and swelling.  You may have sexual intercourse when it is comfortable. You may drive when you no longer are taking prescription pain medication, you can comfortably wear a seatbelt, and you can safely maneuver your car and apply brakes. RETURN TO WORK:  ______________________________________________________________________________________ Bonita Quin should see your doctor in the office for a follow-up appointment approximately two weeks after your surgery.  Your doctor's nurse will typically make your follow-up appointment when she calls you with your pathology report.  Expect your pathology report 2-3 business days after your surgery.  You may call to check if you do not hear from [place] after three days. OTHER INSTRUCTIONS: ___YOU MAY REMOVE THE BINDER AND SHOWER STARTING TOMORROW AND THEN WEAR WHAT EVER IS MOST COMFORTABLE ICE PACK AND EXTRA STRENGTH TYLENOL ALSO FOR PAIN NO VIGOROUS ACTIVITY FOR 1 WEEK____________________________________________________________________________________________ _____________________________________________________________________________________________________________________________________ _____________________________________________________________________________________________________________________________________ _____________________________________________________________________________________________________________________________________  WHEN TO CALL YOUR DOCTOR: Fever over 101.0 Nausea and/or vomiting. Extreme swelling or bruising. Continued bleeding from incision. Increased pain, redness, or drainage from the incision.  The clinic staff is available to answer your questions during regular business hours.  Please don't  hesitate to call and ask to speak to one of the nurses for clinical concerns.  If you have a medical emergency, go to the nearest emergency room or call 911.  A surgeon from [facility] is  always on call at the hospital.  For further questions, please visit [email or web address]    Post Anesthesia Home Care Instructions  Activity: Get plenty of rest for the remainder of the day. A responsible individual must stay with you for 24 hours following the procedure.  For the next 24 hours, DO NOT: -Drive a car -Advertising copywriter -Drink alcoholic beverages -Take any medication unless instructed by your physician -Make any legal decisions or sign important papers.  Meals: Start with liquid foods such as gelatin or soup. Progress to regular foods as tolerated. Avoid greasy, spicy, heavy foods. If nausea and/or vomiting occur, drink only clear liquids until the nausea and/or vomiting subsides. Call your physician if vomiting continues.  Special Instructions/Symptoms: Your throat may feel dry or sore from the anesthesia or the breathing tube placed in your throat during surgery. If this causes discomfort, gargle with warm salt water. The discomfort should disappear within 24 hours.  If you had a scopolamine patch placed behind your ear for the management of post- operative nausea and/or vomiting:  1. The medication in the patch is effective for 72 hours, after which it should be removed.  Wrap patch in a tissue and discard in the trash. Wash hands thoroughly with soap and water. 2. You may remove the patch earlier than 72 hours if you experience unpleasant side effects which may include dry mouth, dizziness or visual disturbances. 3. Avoid touching the patch. Wash your hands with soap and water after contact with the patch.     You may have Tylenol again after 5pm today, if needed.

## 2022-12-29 NOTE — Interval H&P Note (Signed)
History and Physical Interval Note:no change in H and P  12/29/2022 1:59 PM  Sharon Rose  has presented today for surgery, with the diagnosis of BILATERAL BREAST CANCER.  The various methods of treatment have been discussed with the patient and family. After consideration of risks, benefits and other options for treatment, the patient has consented to  Procedure(s): BILATERAL RADIOACTIVE SEED GUIDED LUMPECTOMIES (Bilateral) as a surgical intervention.  The patient's history has been reviewed, patient examined, no change in status, stable for surgery.  I have reviewed the patient's chart and labs.  Questions were answered to the patient's satisfaction.     [name]

## 2022-12-29 NOTE — Anesthesia Procedure Notes (Signed)
Procedure Name: LMA Insertion Date/Time: 12/29/2022 2:34 PM  Performed by: Roosvelt Harps, CRNAPre-anesthesia Checklist: Patient identified, Emergency Drugs available, Suction available, Patient being monitored and Timeout performed Patient Re-evaluated:Patient Re-evaluated prior to induction Oxygen Delivery Method: Circle system utilized Preoxygenation: Pre-oxygenation with 100% oxygen Induction Type: IV induction Ventilation: Mask ventilation without difficulty LMA: LMA inserted LMA Size: 4.0 Number of attempts: 1 Placement Confirmation: positive ETCO2 and breath sounds checked- equal and bilateral Dental Injury: Teeth and Oropharynx as per pre-operative assessment

## 2022-12-30 ENCOUNTER — Encounter (HOSPITAL_BASED_OUTPATIENT_CLINIC_OR_DEPARTMENT_OTHER): Payer: Self-pay | Admitting: Surgery

## 2022-12-31 DIAGNOSIS — C50912 Malignant neoplasm of unspecified site of left female breast: Secondary | ICD-10-CM | POA: Diagnosis not present

## 2022-12-31 DIAGNOSIS — C50911 Malignant neoplasm of unspecified site of right female breast: Secondary | ICD-10-CM | POA: Diagnosis not present

## 2023-01-05 ENCOUNTER — Ambulatory Visit: Payer: PPO | Attending: Cardiology

## 2023-01-05 DIAGNOSIS — Z5181 Encounter for therapeutic drug level monitoring: Secondary | ICD-10-CM | POA: Diagnosis not present

## 2023-01-05 DIAGNOSIS — Z952 Presence of prosthetic heart valve: Secondary | ICD-10-CM

## 2023-01-05 LAB — POCT INR: INR: 1.3 — AB (ref 2.0–3.0)

## 2023-01-05 NOTE — Patient Instructions (Signed)
Description   Take 1.5 tablets today and 1.5 tablets tomorrow and then continue taking warfarin 1 tablet daily except 1/2 tablet on Mondays and Fridays.  Stay consistent with greens each week.   Repeat INR in 1 week.    [facility] [phone]

## 2023-01-06 ENCOUNTER — Encounter: Payer: Self-pay | Admitting: *Deleted

## 2023-01-06 LAB — SURGICAL PATHOLOGY

## 2023-01-11 ENCOUNTER — Ambulatory Visit: Payer: PPO

## 2023-01-12 ENCOUNTER — Encounter: Payer: Self-pay | Admitting: *Deleted

## 2023-01-12 DIAGNOSIS — Z170 Estrogen receptor positive status [ER+]: Secondary | ICD-10-CM

## 2023-01-17 ENCOUNTER — Ambulatory Visit: Payer: PPO | Attending: Cardiovascular Disease | Admitting: *Deleted

## 2023-01-17 DIAGNOSIS — Z952 Presence of prosthetic heart valve: Secondary | ICD-10-CM | POA: Diagnosis not present

## 2023-01-17 DIAGNOSIS — Z7901 Long term (current) use of anticoagulants: Secondary | ICD-10-CM

## 2023-01-17 LAB — POCT INR: INR: 3 (ref 2.0–3.0)

## 2023-01-17 NOTE — Patient Instructions (Signed)
Description   Continue taking warfarin 1 tablet daily except 1/2 tablet on Mondays and Fridays.  Stay consistent with greens each week.   Repeat INR in 2 weeks.     [facility] [phone]

## 2023-01-18 NOTE — Progress Notes (Addendum)
Location of Breast Cancer:Bilateral    Histology per Pathology Report:   FINAL MICROSCOPIC DIAGNOSIS:  A. BREAST, RIGHT, LUMPECTOMY: - Invasive ductal carcinoma, 0.4 cm, grade 1 - Resection margins are negative for carcinoma - Negative for lymphovascular or perineural invasion - Biopsy site changes - See oncology table  B. BREAST, RIGHT POSTERIOR MARGIN, EXCISION: - Benign breast parenchyma with no specific histopathologic changes - Five benign lymph nodes, negative for carcinoma (0/5)  C. BREAST, LEFT 3 O'CLOCK, LUMPECTOMY: - Invasive lobular carcinoma, 1.5 cm, grade 2 - Minute incidental focus of invasive ductal carcinoma, 0.1 cm - Lobular carcinoma in situ - Carcinoma very focally involves the inked posterior margin - Focally suspicious for lymphovascular invasion - Biopsy site changes - See oncology table  D. BREAST, LEFT 2 O'CLOCK, LUMPECTOMY: - Invasive lobular carcinoma, 2.1 cm, grade 2 - Lobular carcinoma in situ - Resection margins are negative for carcinoma; closest is the anterior margin at less than 1 mm - Negative for lymphovascular or perineural invasion - Biopsy site changes - See oncology table     Receptor Status: ER(+), PR (+), Her2-neu (-), Ki-67()  Did patient present with symptoms (if so, please note symptoms) or was this found on screening mammography?: Mammogram on 10/09/2022.  Past/Anticipated interventions by surgeon, if any: She is doing well postoperatively. I gave her and her son a copy the pathology results we discussed these. Again I cannot take any further tissue on the left side as the skin is the anterior margin and the chest wall is the posterior margin. At this point she may resume all normal activity. She will continue her follow-up with medical and radiation oncology. I will plan on seeing her back in 3 months unless she has any issues or recurrent seroma. They agree with the plans  Return in about 3 months (around 04/14/2023). Wayne Both, MD Electronically signed by Wayne Both, MD at 01/14/2023 11:29 AM EST   Past/Anticipated interventions by medical oncology, if any:     Malachy Mood, MD 11/17/2022 4:50 PM  Lymphedema issues, if any:  No   Pain issues, if any: No   SAFETY ISSUES: Prior radiation? No Pacemaker/ICD? She states that she has a machanical heart valve. Possible current pregnancy?no Is the patient on methotrexate? no   BP 131/83 (BP Location: Left Arm, Patient Position: Sitting, Cuff Size: Normal)   Pulse 76   Temp 97.7 F (36.5 C)   Resp 20   Ht 5\' 5"  (1.651 m)   Wt 171 lb (77.6 kg)   SpO2 98%   BMI 28.46 kg/m

## 2023-01-22 NOTE — Progress Notes (Signed)
Radiation Oncology         [phone] ________________________________  Name: Sharon Rose MRN: [id number]  Date: 01/24/2023  DOB: [birth year]  Re-Evaluation Note  CC: Seward Grater, MD  No diagnosis found.  Diagnosis:  Bilateral breast cancers:    -- Stage IA (cT1c, cN0, cM0) Left Breast UOQ, Invasive lobular carcinoma with focal grade 1 IDC and focal LCIS , ER+ / PR+ / Her2-, Grade 2: s/p left breast lumpectomies x 2    -- Stage IA (cT1b, cN0, cM0) Right Breast UOQ, Invasive Ductal Carcinoma, ER+ / PR+ / Her2-, Grade 1 : s/p right breast lumpectomy   Narrative:  The patient returns today to discuss radiation treatment options. She was seen in the multidisciplinary breast clinic on 11/17/22.   She underwent genetic testing on her consultation date. Results showed no clinically significant variants detected by BRCAplus or +RNAinsight testing.  Since her consultation date, she underwent an additional biopsy of the 4 o'clock left breast on 11/19/22. Pathology showed no evidence of evidence of malignancy.   She opted to proceed with bilateral breast lumpectomies without nodal biopsies on 12/29/22 under the care of Dr. Magnus Ivan. Pathology from the procedure revealed:  -- Right breast lumpectomy : tumor the size of 4 mm; histology of grade 1 invasive ductal carcinoma; all margins negative for invasive carcinoma; negative for LVI and PNI. Prognostic indicators significant for: estrogen receptor 99% positive and progesterone receptor 100% positive, both with strong staining intensity; Proliferation marker Ki67 at 5%; Her2 status negative; Grade 1.  -- 3 o'clock left breast lumpectomy: tumor the size of 1.5 cm; histology of grade 2 invasive lobular carcinoma with a minute incidental focus of invasive ductal carcinoma measuring 1 mm, and LCIS; posterior margin focally involved by carcinoma; focal suspicion for LVI noted. Prognostic indicators significant for: estrogen receptor  100% positive and progesterone receptor 100% positive, both with strong staining intensity; Proliferation marker Ki67 at 20%; Her2 status negative; Grade 2.  -- 2 o'clock left breast lumpectomy: tumor the size of 2.1 cm; histology of grade 2 invasive lobular carcinoma with LCIS; all margins negative for carcinoma; negative for LVI and PNI. Prognostic indicators significant for: estrogen receptor 100% positive and progesterone receptor 40% positive, both with strong staining intensity; Proliferation marker Ki67 at 10%; Her2 status negative; Grade 2 -- 5 right axillary lymph nodes were also biopsied and all showed benign findings.   Based on her discussion with Dr. Mosetta Putt during her breast clinic evaluation, she is interested in pursuing antiestrogen therapy consisting of tamoxifen following XRT.   On review of systems, the patient reports ***. She denies *** and any other symptoms.    Allergies:  is allergic to clindamycin hcl, codeine, gabapentin, hydrocodone, nitroglycerin, statins, sulfa antibiotics, and verapamil.  Meds: Current Outpatient Medications  Medication Sig Dispense Refill   aspirin EC 81 MG tablet Take 81 mg by mouth daily.     calcium carbonate (OS-CAL) 600 MG TABS Take 600 mg by mouth 2 (two) times daily with a meal.     losartan (COZAAR) 100 MG tablet TAKE 1 TABLET BY MOUTH EVERY DAY (STOP TAKING THE LOSARTAN-HCTZ COMBO TAB) 90 tablet 1   metoprolol succinate (TOPROL-XL) 25 MG 24 hr tablet TAKE 1/2 TABLET BY MOUTH EVERY DAY 45 tablet 3   Multiple Vitamin (MULTIVITAMIN) tablet Take 1 tablet by mouth daily.     omeprazole (PRILOSEC) 20 MG capsule Take 20 mg by mouth daily.     rosuvastatin (CRESTOR)  5 MG tablet Take 5 mg by mouth every evening.      traMADol (ULTRAM) 50 MG tablet Take 1 tablet (50 mg total) by mouth every 6 (six) hours as needed for moderate pain (pain score 4-6) or severe pain (pain score 7-10). 20 tablet 0   warfarin (COUMADIN) 5 MG tablet TAKE 1/2 TABLET TO 1  TABLET BY MOUTH DAILY AS DIRECTED BY COUMADIN CLINIC 90 tablet 0   No current facility-administered medications for this encounter.    Physical Findings: The patient is in no acute distress. Patient is alert and oriented.  vitals were not taken for this visit.  No significant changes. Lungs are clear to auscultation bilaterally. Heart has regular rate and rhythm. No palpable cervical, supraclavicular, or axillary adenopathy. Abdomen soft, non-tender, normal bowel sounds. Left Breast: no palpable mass, nipple discharge or bleeding. Right Breast: no palpable mass, nipple discharge or bleeding.   Lab Findings: Lab Results  Component Value Date   WBC 5.7 11/17/2022   HGB 12.9 11/17/2022   HCT 37.6 11/17/2022   MCV 93.1 11/17/2022   PLT 253 11/17/2022    Radiographic Findings: No results found.  Impression: Bilateral breast cancers:    -- Stage IA (cT1c, cN0, cM0) Left Breast UOQ, Invasive lobular carcinoma with focal grade 1 IDC and focal LCIS , ER+ / PR+ / Her2-, Grade 2: s/p left breast lumpectomies x 2    -- Stage IA (cT1b, cN0, cM0) Right Breast UOQ, Invasive Ductal Carcinoma, ER+ / PR+ / Her2-, Grade 1 : s/p right breast lumpectomy   ***  Plan:  Patient is scheduled for CT simulation {date/later today}. ***  -----------------------------------  Billie Lade, PhD, MD  This document serves as a record of services personally performed by Antony Blackbird, MD. It was created on his behalf by Neena Rhymes, a trained medical scribe. The creation of this record is based on the scribe's personal observations and the provider's statements to them. This document has been checked and approved by the attending provider.

## 2023-01-24 ENCOUNTER — Encounter: Payer: Self-pay | Admitting: Radiation Oncology

## 2023-01-24 ENCOUNTER — Ambulatory Visit
Admission: RE | Admit: 2023-01-24 | Discharge: 2023-01-24 | Disposition: A | Discharge status: Home / Self Care | Payer: PPO | Source: Ambulatory Visit | Attending: Radiation Oncology | Admitting: Radiation Oncology

## 2023-01-24 VITALS — BP 131/83 | HR 76 | Temp 97.7°F | Resp 20 | Ht 65.0 in | Wt 171.0 lb

## 2023-01-24 DIAGNOSIS — C50411 Malignant neoplasm of upper-outer quadrant of right female breast: Secondary | ICD-10-CM | POA: Diagnosis not present

## 2023-01-24 DIAGNOSIS — C50412 Malignant neoplasm of upper-outer quadrant of left female breast: Secondary | ICD-10-CM | POA: Insufficient documentation

## 2023-01-24 DIAGNOSIS — Z79899 Other long term (current) drug therapy: Secondary | ICD-10-CM | POA: Insufficient documentation

## 2023-01-24 DIAGNOSIS — Z7982 Long term (current) use of aspirin: Secondary | ICD-10-CM | POA: Insufficient documentation

## 2023-01-24 DIAGNOSIS — Z170 Estrogen receptor positive status [ER+]: Secondary | ICD-10-CM | POA: Diagnosis not present

## 2023-01-25 ENCOUNTER — Ambulatory Visit
Admission: RE | Admit: 2023-01-25 | Discharge: 2023-01-25 | Disposition: A | Discharge status: Home / Self Care | Payer: PPO | Source: Ambulatory Visit | Attending: Radiation Oncology | Admitting: Radiation Oncology

## 2023-01-25 DIAGNOSIS — C50412 Malignant neoplasm of upper-outer quadrant of left female breast: Secondary | ICD-10-CM | POA: Diagnosis not present

## 2023-01-25 DIAGNOSIS — Z170 Estrogen receptor positive status [ER+]: Secondary | ICD-10-CM | POA: Diagnosis not present

## 2023-01-28 ENCOUNTER — Telehealth: Payer: Self-pay | Admitting: *Deleted

## 2023-01-28 ENCOUNTER — Inpatient Hospital Stay: Payer: PPO | Admitting: Hematology

## 2023-01-28 ENCOUNTER — Encounter: Payer: Self-pay | Admitting: *Deleted

## 2023-01-28 DIAGNOSIS — C50412 Malignant neoplasm of upper-outer quadrant of left female breast: Secondary | ICD-10-CM

## 2023-01-28 NOTE — Telephone Encounter (Signed)
 Telephone call to patient to cancel appt for today. Patient will have this appt rescheduled once she completes her radiation. Patient verbalized an understanding. Message sent to schedulers.

## 2023-01-31 ENCOUNTER — Ambulatory Visit: Payer: PPO | Attending: Adult Health | Admitting: Adult Health

## 2023-01-31 ENCOUNTER — Telehealth: Payer: Self-pay | Admitting: Cardiovascular Disease

## 2023-01-31 ENCOUNTER — Encounter: Payer: Self-pay | Admitting: Adult Health

## 2023-01-31 VITALS — BP 144/86 | HR 83 | Ht 65.0 in | Wt 171.4 lb

## 2023-01-31 DIAGNOSIS — I10 Essential (primary) hypertension: Secondary | ICD-10-CM | POA: Diagnosis not present

## 2023-01-31 DIAGNOSIS — E7800 Pure hypercholesterolemia, unspecified: Secondary | ICD-10-CM

## 2023-01-31 DIAGNOSIS — Z7901 Long term (current) use of anticoagulants: Secondary | ICD-10-CM

## 2023-01-31 DIAGNOSIS — Z01818 Encounter for other preprocedural examination: Secondary | ICD-10-CM | POA: Diagnosis not present

## 2023-01-31 DIAGNOSIS — I2510 Atherosclerotic heart disease of native coronary artery without angina pectoris: Secondary | ICD-10-CM | POA: Diagnosis not present

## 2023-01-31 DIAGNOSIS — Z952 Presence of prosthetic heart valve: Secondary | ICD-10-CM

## 2023-01-31 DIAGNOSIS — Z170 Estrogen receptor positive status [ER+]: Secondary | ICD-10-CM

## 2023-01-31 DIAGNOSIS — C50412 Malignant neoplasm of upper-outer quadrant of left female breast: Secondary | ICD-10-CM

## 2023-01-31 NOTE — Telephone Encounter (Signed)
 [facility] stated, last week they sent a staff message to Dr. Francyne and device clinic to get a clearance for pt's upcoming procedure; Bilateral radioactive seed guided lumpectomies. Pt is scheduled in 2 days, they need a clearance because pt has mechanical valve. She gave fax# [phone]. Tarra's phone number  [phone]

## 2023-01-31 NOTE — Telephone Encounter (Signed)
 Patient identification verified by 2 forms. Bertina Cooks, RN    Called and spoke to patient  Informed patient clearance request received prior to beginning 1/8 treatment  Patient scheduled for OV 1/6 at 3:10pm Patient agrees, no further questions at this time   ______________________________   [place] and spoke to Rexene Rexene states:   -patient will start radiation treatment on 02/02/23  -has not received pre-op clearance   -pre-op request was sent to Dr. Francyne   -due to patient having mechanical valve, need clearance for patient to begin treatment   -Fax was sent with request    RN confirmed with CMA [facility] fax printed and available for provider NP Jerilynn to review at 1/6 appointment

## 2023-01-31 NOTE — Progress Notes (Signed)
 Cardiology Office Note:  .   Date:  01/31/2023  ID:  Sharon Rose, DOB 1941-01-22, MRN [id number] PCP: Auston Opal, DO  [facility] Providers Cardiologist:  Jerel Balding, MD Electrophysiologist:  Eulas FORBES Furbish, MD }   }   History of Present Illness: .   Sharon Rose is a 83 y.o. female h/o CAD s/p NSTEMI 2006, paroxysmal AF AS due to bicuspid AV s/p AVS, HTN, HLD, cognitive changes.  She comes today prior to having had radiation therapy after having had lumpectomy bilaterally.  The patient will have 21 sessions of radiation.  She has no cardiac complaints.  She continues to follow in our Coumadin  clinic at the [facility] office.  She denies chest pain, dyspnea on exertion, she is having some fatigue.  Blood pressure has been well-controlled.  ROS: As above otherwise negative  Studies Reviewed: SABRA    EKG Interpretation Date/Time:  Monday January 31 2023 15:06:45 EST Ventricular Rate:  83 PR Interval:  312 QRS Duration:  86 QT Interval:  388 QTC Calculation: 455 R Axis:   -46  Text Interpretation: Sinus rhythm with 1st degree A-V block with Premature atrial complexes with Abberant conduction Pulmonary disease pattern Left anterior fascicular block When compared with ECG of 28-Dec-2022 11:58, Abberant conduction is now Present Confirmed by Jerilynn Collar [phone]) on 01/31/2023 4:16:40 PM    Physical Exam:   VS:  BP (!) 144/86   Pulse 83   Ht 5' 5 (1.651 m)   Wt 171 lb 6.4 oz (77.7 kg)   SpO2 96%   BMI 28.52 kg/m    Wt Readings from Last 3 Encounters:  01/31/23 171 lb 6.4 oz (77.7 kg)  01/24/23 171 lb (77.6 kg)  12/29/22 170 lb 6.7 oz (77.3 kg)    GEN: Well nourished, well developed in no acute distress NECK: No JVD; No carotid bruits CARDIAC: RRR, crisp click is noted no, rubs, gallops RESPIRATORY:  Clear to auscultation without rales, wheezing or rhonchi  ABDOMEN: Soft, non-tender, non-distended EXTREMITIES:  No edema; No deformity   ASSESSMENT AND  PLAN: .    Aortic valve stenosis: Status post aortic valve replacement ([employer] Jude) 09/12/2004.  Per echocardiogram on 03/17/2021 patient aortic valve is functioning appropriately.  Oncology request that we signed a form stating that we are aware that she will be having radiation to her left breast with known history of aortic valve replacement using mechanical aortic valve.  This has been signed and faxed back to them.  The patient will have a repeat echocardiogram which is already scheduled for March 03, 2023 post radiation therapy.  2.  Coronary artery disease: Per coronary CTA on 10/05/2021 the patient's coronary calcium  score was 77.  The patient had minimal CAD in the proximal mid LAD less than 25% stenosis.  Secondary risk management should continue with blood pressure control, lipid management, and purposeful exercise.  3.  Hypertension: Slightly elevated in the office today but better than previous visits.  Will continue current medication management to include losartan  100 mg daily, metoprolol .  Low-sodium diet is recommended.  4.  Hypercholesterolemia: Lipid management by primary care.  She remains on rosuvastatin  5 mg daily.  Goal of LDL less than 100 with known history of minimal CAD to prevent progression.  5.  Bilateral breast cancer: Undergoing radiation therapy after bilateral lumpectomies.  Will begin on 02/02/2023 for 21 sessions.  Follow-up echocardiogram is planned after radiation to evaluate mechanical aortic valve.  This is previously been scheduled  for March 03, 2023.         Signed, Lamarr HERO. Jerilynn CHOL, ANP, AACC

## 2023-01-31 NOTE — Telephone Encounter (Signed)
 S/w Tarra at the cancer center.  The cancer center is doing pt's surgery.  Sent over via Epic to [employer]	[place] as requested pt's pre op clearance telephone note to the numbers listed on the previous note.

## 2023-01-31 NOTE — Patient Instructions (Signed)
 Medication Instructions:  No changes *If you need a refill on your cardiac medications before your next appointment, please call your pharmacy*   Lab Work: No Labs If you have labs (blood work) drawn today and your tests are completely normal, you will receive your results only by: MyChart Message (if you have MyChart) OR A paper copy in the mail If you have any lab test that is abnormal or we need to change your treatment, we will call you to review the results.   Testing/Procedures: No Testing   Follow-Up: At [facility], you and your health needs are our priority.  As part of our continuing mission to provide you with exceptional heart care, we have created designated Provider Care Teams.  These Care Teams include your primary Cardiologist (physician) and Advanced Practice Providers (APPs -  Physician Assistants and Nurse Practitioners) who all work together to provide you with the care you need, when you need it.  We recommend signing up for the patient portal called MyChart.  Sign up information is provided on this After Visit Summary.  MyChart is used to connect with patients for Virtual Visits (Telemedicine).  Patients are able to view lab/test results, encounter notes, upcoming appointments, etc.  Non-urgent messages can be sent to your provider as well.   To learn more about what you can do with MyChart, go to [email or web address].    Your next appointment:   March 2025  Provider:   Jerel Balding, MD

## 2023-02-01 ENCOUNTER — Ambulatory Visit
Admission: RE | Admit: 2023-02-01 | Discharge: 2023-02-01 | Disposition: A | Discharge status: Home / Self Care | Payer: HMO | Source: Ambulatory Visit | Attending: Radiation Oncology | Admitting: Radiation Oncology

## 2023-02-01 DIAGNOSIS — Z170 Estrogen receptor positive status [ER+]: Secondary | ICD-10-CM | POA: Insufficient documentation

## 2023-02-01 DIAGNOSIS — C50412 Malignant neoplasm of upper-outer quadrant of left female breast: Secondary | ICD-10-CM | POA: Diagnosis present

## 2023-02-02 ENCOUNTER — Other Ambulatory Visit: Payer: Self-pay

## 2023-02-02 DIAGNOSIS — C50412 Malignant neoplasm of upper-outer quadrant of left female breast: Secondary | ICD-10-CM | POA: Diagnosis not present

## 2023-02-02 DIAGNOSIS — Z170 Estrogen receptor positive status [ER+]: Secondary | ICD-10-CM

## 2023-02-02 LAB — RAD ONC ARIA SESSION SUMMARY
Course Elapsed Days: 0
Plan Fractions Treated to Date: 1
Plan Prescribed Dose Per Fraction: 2.67 Gy
Plan Total Fractions Prescribed: 15
Plan Total Prescribed Dose: 40.05 Gy
Reference Point Dosage Given to Date: 2.67 Gy
Reference Point Session Dosage Given: 2.67 Gy
Session Number: 1

## 2023-02-03 ENCOUNTER — Ambulatory Visit: Payer: PPO | Attending: Cardiology

## 2023-02-03 ENCOUNTER — Ambulatory Visit: Payer: HMO

## 2023-02-03 DIAGNOSIS — Z952 Presence of prosthetic heart valve: Secondary | ICD-10-CM | POA: Diagnosis not present

## 2023-02-03 LAB — POCT INR: INR: 2.7 (ref 2.0–3.0)

## 2023-02-03 NOTE — Patient Instructions (Signed)
 Description   Continue taking warfarin 1 tablet daily except 1/2 tablet on Mondays and Fridays.  Stay consistent with greens each week.   Repeat INR in 3 weeks.     Coumadin Clinic [phone]

## 2023-02-04 ENCOUNTER — Ambulatory Visit
Admission: RE | Admit: 2023-02-04 | Discharge: 2023-02-04 | Disposition: A | Discharge status: Home / Self Care | Payer: HMO | Source: Ambulatory Visit | Attending: Radiation Oncology | Admitting: Radiation Oncology

## 2023-02-04 ENCOUNTER — Other Ambulatory Visit: Payer: Self-pay

## 2023-02-04 DIAGNOSIS — C50412 Malignant neoplasm of upper-outer quadrant of left female breast: Secondary | ICD-10-CM | POA: Diagnosis not present

## 2023-02-04 LAB — RAD ONC ARIA SESSION SUMMARY
Course Elapsed Days: 2
Plan Fractions Treated to Date: 2
Plan Prescribed Dose Per Fraction: 2.67 Gy
Plan Total Fractions Prescribed: 15
Plan Total Prescribed Dose: 40.05 Gy
Reference Point Dosage Given to Date: 5.34 Gy
Reference Point Session Dosage Given: 2.67 Gy
Session Number: 2

## 2023-02-06 NOTE — Telephone Encounter (Signed)
 Thanks for seeing her. First time I have seen anyone request cardiac clearance for a prosthetic valve before XRT.

## 2023-02-07 ENCOUNTER — Other Ambulatory Visit: Payer: Self-pay

## 2023-02-07 ENCOUNTER — Ambulatory Visit
Admission: RE | Admit: 2023-02-07 | Discharge: 2023-02-07 | Disposition: A | Discharge status: Home / Self Care | Payer: HMO | Source: Ambulatory Visit | Attending: Radiation Oncology | Admitting: Radiation Oncology

## 2023-02-07 DIAGNOSIS — C50412 Malignant neoplasm of upper-outer quadrant of left female breast: Secondary | ICD-10-CM | POA: Diagnosis not present

## 2023-02-07 LAB — RAD ONC ARIA SESSION SUMMARY
Course Elapsed Days: 5
Plan Fractions Treated to Date: 3
Plan Prescribed Dose Per Fraction: 2.67 Gy
Plan Total Fractions Prescribed: 15
Plan Total Prescribed Dose: 40.05 Gy
Reference Point Dosage Given to Date: 8.01 Gy
Reference Point Session Dosage Given: 2.67 Gy
Session Number: 3

## 2023-02-08 ENCOUNTER — Ambulatory Visit: Payer: HMO

## 2023-02-08 ENCOUNTER — Other Ambulatory Visit: Payer: Self-pay

## 2023-02-08 ENCOUNTER — Ambulatory Visit
Admission: RE | Admit: 2023-02-08 | Discharge: 2023-02-08 | Disposition: A | Discharge status: Home / Self Care | Payer: HMO | Source: Ambulatory Visit | Attending: Radiation Oncology | Admitting: Radiation Oncology

## 2023-02-08 DIAGNOSIS — C50412 Malignant neoplasm of upper-outer quadrant of left female breast: Secondary | ICD-10-CM | POA: Diagnosis not present

## 2023-02-08 LAB — RAD ONC ARIA SESSION SUMMARY
Course Elapsed Days: 6
Plan Fractions Treated to Date: 4
Plan Prescribed Dose Per Fraction: 2.67 Gy
Plan Total Fractions Prescribed: 15
Plan Total Prescribed Dose: 40.05 Gy
Reference Point Dosage Given to Date: 10.68 Gy
Reference Point Session Dosage Given: 2.67 Gy
Session Number: 4

## 2023-02-09 ENCOUNTER — Other Ambulatory Visit: Payer: Self-pay

## 2023-02-09 ENCOUNTER — Ambulatory Visit
Admission: RE | Admit: 2023-02-09 | Discharge: 2023-02-09 | Disposition: A | Discharge status: Home / Self Care | Payer: HMO | Source: Ambulatory Visit | Attending: Radiation Oncology | Admitting: Radiation Oncology

## 2023-02-09 DIAGNOSIS — C50412 Malignant neoplasm of upper-outer quadrant of left female breast: Secondary | ICD-10-CM | POA: Diagnosis not present

## 2023-02-09 LAB — RAD ONC ARIA SESSION SUMMARY
Course Elapsed Days: 7
Plan Fractions Treated to Date: 5
Plan Prescribed Dose Per Fraction: 2.67 Gy
Plan Total Fractions Prescribed: 15
Plan Total Prescribed Dose: 40.05 Gy
Reference Point Dosage Given to Date: 13.35 Gy
Reference Point Session Dosage Given: 2.67 Gy
Session Number: 5

## 2023-02-10 ENCOUNTER — Other Ambulatory Visit: Payer: Self-pay

## 2023-02-10 ENCOUNTER — Ambulatory Visit
Admission: RE | Admit: 2023-02-10 | Discharge: 2023-02-10 | Disposition: A | Discharge status: Home / Self Care | Payer: HMO | Source: Ambulatory Visit | Attending: Radiation Oncology | Admitting: Radiation Oncology

## 2023-02-10 DIAGNOSIS — C50412 Malignant neoplasm of upper-outer quadrant of left female breast: Secondary | ICD-10-CM | POA: Diagnosis not present

## 2023-02-10 LAB — RAD ONC ARIA SESSION SUMMARY
Course Elapsed Days: 8
Plan Fractions Treated to Date: 6
Plan Prescribed Dose Per Fraction: 2.67 Gy
Plan Total Fractions Prescribed: 15
Plan Total Prescribed Dose: 40.05 Gy
Reference Point Dosage Given to Date: 16.02 Gy
Reference Point Session Dosage Given: 2.67 Gy
Session Number: 6

## 2023-02-11 ENCOUNTER — Other Ambulatory Visit: Payer: Self-pay

## 2023-02-11 ENCOUNTER — Ambulatory Visit
Admission: RE | Admit: 2023-02-11 | Discharge: 2023-02-11 | Disposition: A | Discharge status: Home / Self Care | Payer: HMO | Source: Ambulatory Visit | Attending: Radiation Oncology | Admitting: Radiation Oncology

## 2023-02-11 DIAGNOSIS — C50412 Malignant neoplasm of upper-outer quadrant of left female breast: Secondary | ICD-10-CM | POA: Diagnosis not present

## 2023-02-11 LAB — RAD ONC ARIA SESSION SUMMARY
Course Elapsed Days: 9
Plan Fractions Treated to Date: 7
Plan Prescribed Dose Per Fraction: 2.67 Gy
Plan Total Fractions Prescribed: 15
Plan Total Prescribed Dose: 40.05 Gy
Reference Point Dosage Given to Date: 18.69 Gy
Reference Point Session Dosage Given: 2.67 Gy
Session Number: 7

## 2023-02-13 ENCOUNTER — Ambulatory Visit
Admission: EM | Admit: 2023-02-13 | Discharge: 2023-02-13 | Disposition: A | Discharge status: Home / Self Care | Payer: PPO | Attending: Emergency Medicine | Admitting: Emergency Medicine

## 2023-02-13 ENCOUNTER — Encounter: Payer: Self-pay | Admitting: Emergency Medicine

## 2023-02-13 DIAGNOSIS — R300 Dysuria: Secondary | ICD-10-CM | POA: Diagnosis not present

## 2023-02-13 LAB — POCT URINALYSIS DIP (MANUAL ENTRY)
Bilirubin, UA: NEGATIVE
Blood, UA: NEGATIVE
Glucose, UA: NEGATIVE mg/dL
Ketones, POC UA: NEGATIVE mg/dL
Nitrite, UA: NEGATIVE
Protein Ur, POC: NEGATIVE mg/dL
Spec Grav, UA: 1.01 (ref 1.010–1.025)
Urobilinogen, UA: 0.2 U/dL
pH, UA: 6.5 (ref 5.0–8.0)

## 2023-02-13 MED ORDER — PHENAZOPYRIDINE HCL 200 MG PO TABS: 200.0000 mg | ORAL_TABLET | Freq: Three times a day (TID) | ORAL | 0 refills | Status: DC

## 2023-02-13 MED ORDER — CEPHALEXIN 500 MG PO CAPS: 500.0000 mg | ORAL_CAPSULE | Freq: Three times a day (TID) | ORAL | 0 refills | Status: AC

## 2023-02-13 NOTE — ED Triage Notes (Signed)
Pt presents with urinary frequency and pressure x 2 days.

## 2023-02-13 NOTE — Discharge Instructions (Signed)
Your urinalysis at this time does not show bacteria your urine will be sent to the lab to determine exactly which bacteria is present, if any changes need to be made to your medications you will be notified  Begin use of cephalexin every 8 hours for 5 days as you are symptomatic  May Use Pyridium every 8 hours  to help minimize your symptoms until antibiotic removes bacteria, this medication will turn your urine orange  Increase your fluid intake through use of water  As always practice good hygiene, wiping front to back and avoidance of scented vaginal products to prevent further irritation  If symptoms continue to persist after use of medication or recur please follow-up with urgent care or your primary doctor as needed

## 2023-02-13 NOTE — ED Provider Notes (Signed)
[facility]    CSN: [id number] Arrival date & time: 02/13/23  1502      History   Chief Complaint Chief Complaint  Patient presents with   Urinary Frequency    HPI Sharon Rose is a 83 y.o. female.   Patient presents for evaluation of urinary frequency and dysuria beginning 2 days ago.  Has attempted to increase fluids to flush out the bladder which has been ineffective.  Denies hematuria, abdominal or flank pain, fever or vaginal symptoms.  Has history of reoccurring infections.   Past Medical History:  Diagnosis Date   Age-related memory disorder 12/20/2022   pt reports   Breast cancer (HCC)    CAD (coronary artery disease)    NUCLEAR STRESS TEST, 10/07/2008 - no ischemia   Dyspnea on exertion    H/O aortic valve replacement    2D ECHO, 04/26/2011 - EF 50-55%, normal-mild   Hypertension    NSTEMI (non-ST elevated myocardial infarction) (HCC) 12/2004   Secondary to occlusion of OM 2, small vessel and medical therapy recommended; cath 2011 with no CAD    Patient Active Problem List   Diagnosis Date Noted   Genetic testing 12/07/2022   Family history of breast cancer 11/17/2022   Malignant neoplasm of upper-outer quadrant of left breast in female, estrogen receptor positive (HCC) 11/15/2022   Malignant neoplasm of upper-outer quadrant of right breast in female, estrogen receptor positive (HCC) 11/15/2022   Stable angina (HCC) 06/08/2022   PAD (peripheral artery disease) (HCC) 04/13/2021   Leg pain 04/13/2021   Chronic venous insufficiency 04/13/2021   Contusion of right knee 12/16/2020   Lumbar radiculopathy 03/27/2018   Degeneration of lumbar intervertebral disc 03/13/2018   Osteopenia 03/13/2018   Scoliosis deformity of spine 03/13/2018   Spondylolisthesis, grade 1 03/13/2018   History of mechanical aortic valve replacement 07/08/2014   Palpitations 07/08/2014   CAD (coronary artery disease) 07/11/2012   Hypercholesterolemia 07/11/2012   Symptomatic  bradycardia 07/09/2012   Hypokalemia 07/09/2012   Essential hypertension 07/09/2012   Long term current use of anticoagulant 04/13/2012   H/O aortic valve replacement 04/13/2012    Past Surgical History:  Procedure Laterality Date   ABDOMINAL HYSTERECTOMY     AORTIC VALVE REPLACEMENT  09/12/2004   St. Jude mechanical aortic valve, model #23AK-501, serial [id number]   BREAST LUMPECTOMY WITH RADIOACTIVE SEED LOCALIZATION Bilateral 12/29/2022   Procedure: BILATERAL RADIOACTIVE SEED GUIDED LUMPECTOMIES;  Surgeon: [name], MD;  Location: [facility];  Service: General;  Laterality: Bilateral;   CARDIAC CATHETERIZATION Left 03/25/2009   Normal cors   CARDIAC CATHETERIZATION  01/20/2005   Medical management for occluded and possibly revascularized OM 2, string sign seen   CARDIAC CATHETERIZATION  08/11/2004   Elective aortic valve replacement, severe aortic valvre stenosis with functional class II to III symptomatology    OB History   No obstetric history on file.      Home Medications    Prior to Admission medications   Medication Sig Start Date End Date Taking? Authorizing Provider  cephALEXin (KEFLEX) 500 MG capsule Take 1 capsule (500 mg total) by mouth 3 (three) times daily for 5 days. 02/13/23 02/18/23 Yes Tyrik Stetzer, Elita Boone, NP  phenazopyridine (PYRIDIUM) 200 MG tablet Take 1 tablet (200 mg total) by mouth 3 (three) times daily. 02/13/23  Yes Salli Quarry R, NP  aspirin EC 81 MG tablet Take 81 mg by mouth daily.    [provider]  calcium carbonate (OS-CAL) 600 MG  TABS Take 600 mg by mouth 2 (two) times daily with a meal.    [provider]  losartan (COZAAR) 100 MG tablet TAKE 1 TABLET BY MOUTH EVERY DAY (STOP TAKING THE LOSARTAN-HCTZ COMBO TAB) 11/08/22   Croitoru, Mihai, MD  metoprolol succinate (TOPROL-XL) 25 MG 24 hr tablet TAKE 1/2 TABLET BY MOUTH EVERY DAY 05/31/22   Croitoru, Mihai, MD  Multiple Vitamin (MULTIVITAMIN) tablet Take 1  tablet by mouth daily.    [provider]  omeprazole (PRILOSEC) 20 MG capsule Take 20 mg by mouth daily.    [provider]  rosuvastatin (CRESTOR) 5 MG tablet Take 5 mg by mouth every evening.     [provider]  traMADol (ULTRAM) 50 MG tablet Take 1 tablet (50 mg total) by mouth every 6 (six) hours as needed for moderate pain (pain score 4-6) or severe pain (pain score 7-10). 12/29/22   [name], MD  warfarin (COUMADIN) 5 MG tablet TAKE 1/2 TABLET TO 1 TABLET BY MOUTH DAILY AS DIRECTED BY COUMADIN CLINIC 12/06/22   Croitoru, Rachelle Hora, MD    Family History Family History  Problem Relation Age of Onset   Fibrocystic breast disease Maternal Aunt     Social History Social History   Tobacco Use   Smoking status: Never   Smokeless tobacco: Never  Vaping Use   Vaping status: Never Used  Substance Use Topics   Alcohol use: No   Drug use: No     Allergies   Amlodipine besylate, Clindamycin hcl, Codeine, Gabapentin, Hydrocodone, Nitroglycerin, Statins, Sulfa antibiotics, Verapamil, and Diclofenac sodium   Review of Systems Review of Systems   Physical Exam Triage Vital Signs ED Triage Vitals [02/13/23 1516]  Encounter Vitals Group     BP 136/77     Systolic BP Percentile      Diastolic BP Percentile      Pulse Rate 75     Resp 18     Temp 98.6 F (37 C)     Temp Source Oral     SpO2 96 %     Weight      Height      Head Circumference      Peak Flow      Pain Score 0     Pain Loc      Pain Education      Exclude from Growth Chart    No data found.  Updated Vital Signs BP 136/77 (BP Location: Left Arm)   Pulse 75   Temp 98.6 F (37 C) (Oral)   Resp 18   SpO2 96%   Visual Acuity Right Eye Distance:   Left Eye Distance:   Bilateral Distance:    Right Eye Near:   Left Eye Near:    Bilateral Near:     Physical Exam Constitutional:      Appearance: Normal appearance.  Eyes:     Extraocular Movements: Extraocular  movements intact.  Abdominal:     General: There is no distension.     Tenderness: There is no abdominal tenderness. There is no right CVA tenderness, left CVA tenderness or guarding.  Neurological:     Mental Status: She is alert and oriented to person, place, and time.      UC Treatments / Results  Labs (all labs ordered are listed, but only abnormal results are displayed) Labs Reviewed  POCT URINALYSIS DIP (MANUAL ENTRY) - Abnormal; Notable for the following components:      Result Value  Leukocytes, UA Moderate (2+) (*)    All other components within normal limits  URINE CULTURE    EKG   Radiology No results found.  Procedures Procedures (including critical care time)  Medications Ordered in UC Medications - No data to display  Initial Impression / Assessment and Plan / UC Course  I have reviewed the triage vital signs and the nursing notes.  Pertinent labs & imaging results that were available during my care of the patient were reviewed by me and considered in my medical decision making (see chart for details).  Dysuria  Vitals are stable, patient is in no signs of distress nontoxic-appearing, no abdominal or CVA tenderness noted on exam, stable for outpatient management, urinalysis showing leukocytes, as patient is symptomatic we will initiate treatment, urine sent for culture, prescribed cephalexin and recommended supportive care with Pyridium and additional nonpharmacological management, may follow-up as needed Final Clinical Impressions(s) / UC Diagnoses   Final diagnoses:  Dysuria     Discharge Instructions      Your urinalysis at this time does not show bacteria your urine will be sent to the lab to determine exactly which bacteria is present, if any changes need to be made to your medications you will be notified  Begin use of cephalexin every 8 hours for 5 days as you are symptomatic  May Use Pyridium every 8 hours  to help minimize your symptoms  until antibiotic removes bacteria, this medication will turn your urine orange  Increase your fluid intake through use of water  As always practice good hygiene, wiping front to back and avoidance of scented vaginal products to prevent further irritation  If symptoms continue to persist after use of medication or recur please follow-up with urgent care or your primary doctor as needed    ED Prescriptions     Medication Sig Dispense Auth. Provider   cephALEXin (KEFLEX) 500 MG capsule Take 1 capsule (500 mg total) by mouth 3 (three) times daily for 5 days. 15 capsule Gunnard Dorrance R, NP   phenazopyridine (PYRIDIUM) 200 MG tablet Take 1 tablet (200 mg total) by mouth 3 (three) times daily. 6 tablet Valinda Hoar, NP      PDMP not reviewed this encounter.   Valinda Hoar, NP 02/13/23 1546

## 2023-02-14 ENCOUNTER — Other Ambulatory Visit: Payer: Self-pay

## 2023-02-14 ENCOUNTER — Ambulatory Visit
Admission: RE | Admit: 2023-02-14 | Discharge: 2023-02-14 | Disposition: A | Discharge status: Home / Self Care | Payer: HMO | Source: Ambulatory Visit | Attending: Radiation Oncology | Admitting: Radiation Oncology

## 2023-02-14 DIAGNOSIS — C50412 Malignant neoplasm of upper-outer quadrant of left female breast: Secondary | ICD-10-CM | POA: Diagnosis not present

## 2023-02-14 LAB — RAD ONC ARIA SESSION SUMMARY
Course Elapsed Days: 12
Plan Fractions Treated to Date: 8
Plan Prescribed Dose Per Fraction: 2.67 Gy
Plan Total Fractions Prescribed: 15
Plan Total Prescribed Dose: 40.05 Gy
Reference Point Dosage Given to Date: 21.36 Gy
Reference Point Session Dosage Given: 2.67 Gy
Session Number: 8

## 2023-02-14 LAB — URINE CULTURE: Culture: NO GROWTH

## 2023-02-15 ENCOUNTER — Other Ambulatory Visit: Payer: Self-pay

## 2023-02-15 ENCOUNTER — Ambulatory Visit: Payer: HMO

## 2023-02-15 ENCOUNTER — Ambulatory Visit
Admission: RE | Admit: 2023-02-15 | Discharge: 2023-02-15 | Disposition: A | Discharge status: Home / Self Care | Payer: HMO | Source: Ambulatory Visit | Attending: Radiation Oncology | Admitting: Radiation Oncology

## 2023-02-15 ENCOUNTER — Ambulatory Visit: Payer: HMO | Admitting: Radiation Oncology

## 2023-02-15 DIAGNOSIS — C50412 Malignant neoplasm of upper-outer quadrant of left female breast: Secondary | ICD-10-CM | POA: Diagnosis not present

## 2023-02-15 LAB — RAD ONC ARIA SESSION SUMMARY
Course Elapsed Days: 13
Plan Fractions Treated to Date: 9
Plan Prescribed Dose Per Fraction: 2.67 Gy
Plan Total Fractions Prescribed: 15
Plan Total Prescribed Dose: 40.05 Gy
Reference Point Dosage Given to Date: 24.03 Gy
Reference Point Session Dosage Given: 2.67 Gy
Session Number: 9

## 2023-02-16 ENCOUNTER — Ambulatory Visit: Payer: HMO

## 2023-02-17 ENCOUNTER — Ambulatory Visit
Admission: RE | Admit: 2023-02-17 | Discharge: 2023-02-17 | Disposition: A | Discharge status: Home / Self Care | Payer: HMO | Source: Ambulatory Visit | Attending: Radiation Oncology | Admitting: Radiation Oncology

## 2023-02-17 ENCOUNTER — Other Ambulatory Visit: Payer: Self-pay

## 2023-02-17 DIAGNOSIS — C50412 Malignant neoplasm of upper-outer quadrant of left female breast: Secondary | ICD-10-CM | POA: Diagnosis not present

## 2023-02-17 LAB — RAD ONC ARIA SESSION SUMMARY
Course Elapsed Days: 15
Plan Fractions Treated to Date: 10
Plan Prescribed Dose Per Fraction: 2.67 Gy
Plan Total Fractions Prescribed: 15
Plan Total Prescribed Dose: 40.05 Gy
Reference Point Dosage Given to Date: 26.7 Gy
Reference Point Session Dosage Given: 2.67 Gy
Session Number: 10

## 2023-02-18 ENCOUNTER — Other Ambulatory Visit: Payer: Self-pay

## 2023-02-18 ENCOUNTER — Ambulatory Visit
Admission: RE | Admit: 2023-02-18 | Discharge: 2023-02-18 | Disposition: A | Discharge status: Home / Self Care | Payer: HMO | Source: Ambulatory Visit | Attending: Radiation Oncology | Admitting: Radiation Oncology

## 2023-02-18 DIAGNOSIS — C50412 Malignant neoplasm of upper-outer quadrant of left female breast: Secondary | ICD-10-CM | POA: Diagnosis not present

## 2023-02-18 LAB — RAD ONC ARIA SESSION SUMMARY
Course Elapsed Days: 16
Plan Fractions Treated to Date: 11
Plan Prescribed Dose Per Fraction: 2.67 Gy
Plan Total Fractions Prescribed: 15
Plan Total Prescribed Dose: 40.05 Gy
Reference Point Dosage Given to Date: 29.37 Gy
Reference Point Session Dosage Given: 2.67 Gy
Session Number: 11

## 2023-02-21 ENCOUNTER — Ambulatory Visit
Admission: RE | Admit: 2023-02-21 | Discharge: 2023-02-21 | Disposition: A | Discharge status: Home / Self Care | Payer: HMO | Source: Ambulatory Visit | Attending: Radiation Oncology | Admitting: Radiation Oncology

## 2023-02-21 ENCOUNTER — Other Ambulatory Visit: Payer: Self-pay

## 2023-02-21 DIAGNOSIS — C50412 Malignant neoplasm of upper-outer quadrant of left female breast: Secondary | ICD-10-CM | POA: Diagnosis not present

## 2023-02-21 LAB — RAD ONC ARIA SESSION SUMMARY
Course Elapsed Days: 19
Plan Fractions Treated to Date: 12
Plan Prescribed Dose Per Fraction: 2.67 Gy
Plan Total Fractions Prescribed: 15
Plan Total Prescribed Dose: 40.05 Gy
Reference Point Dosage Given to Date: 32.04 Gy
Reference Point Session Dosage Given: 2.67 Gy
Session Number: 12

## 2023-02-22 ENCOUNTER — Ambulatory Visit: Payer: HMO

## 2023-02-22 ENCOUNTER — Ambulatory Visit
Admission: RE | Admit: 2023-02-22 | Discharge: 2023-02-22 | Disposition: A | Discharge status: Home / Self Care | Payer: HMO | Source: Ambulatory Visit | Attending: Radiation Oncology | Admitting: Radiation Oncology

## 2023-02-22 ENCOUNTER — Other Ambulatory Visit: Payer: Self-pay

## 2023-02-22 DIAGNOSIS — C50412 Malignant neoplasm of upper-outer quadrant of left female breast: Secondary | ICD-10-CM | POA: Diagnosis not present

## 2023-02-22 LAB — RAD ONC ARIA SESSION SUMMARY
Course Elapsed Days: 20
Plan Fractions Treated to Date: 13
Plan Prescribed Dose Per Fraction: 2.67 Gy
Plan Total Fractions Prescribed: 15
Plan Total Prescribed Dose: 40.05 Gy
Reference Point Dosage Given to Date: 34.71 Gy
Reference Point Session Dosage Given: 2.67 Gy
Session Number: 13

## 2023-02-23 ENCOUNTER — Ambulatory Visit
Admission: RE | Admit: 2023-02-23 | Discharge: 2023-02-23 | Disposition: A | Discharge status: Home / Self Care | Payer: HMO | Source: Ambulatory Visit | Attending: Radiation Oncology | Admitting: Radiation Oncology

## 2023-02-23 ENCOUNTER — Ambulatory Visit: Payer: HMO

## 2023-02-23 ENCOUNTER — Other Ambulatory Visit: Payer: Self-pay

## 2023-02-23 DIAGNOSIS — C50412 Malignant neoplasm of upper-outer quadrant of left female breast: Secondary | ICD-10-CM | POA: Diagnosis not present

## 2023-02-23 LAB — RAD ONC ARIA SESSION SUMMARY
Course Elapsed Days: 21
Plan Fractions Treated to Date: 14
Plan Prescribed Dose Per Fraction: 2.67 Gy
Plan Total Fractions Prescribed: 15
Plan Total Prescribed Dose: 40.05 Gy
Reference Point Dosage Given to Date: 37.38 Gy
Reference Point Session Dosage Given: 2.67 Gy
Session Number: 14

## 2023-02-24 ENCOUNTER — Other Ambulatory Visit: Payer: Self-pay

## 2023-02-24 ENCOUNTER — Ambulatory Visit: Payer: HMO

## 2023-02-24 ENCOUNTER — Ambulatory Visit: Payer: PPO | Attending: Cardiovascular Disease | Admitting: *Deleted

## 2023-02-24 ENCOUNTER — Ambulatory Visit
Admission: RE | Admit: 2023-02-24 | Discharge: 2023-02-24 | Disposition: A | Discharge status: Home / Self Care | Payer: PPO | Source: Ambulatory Visit | Attending: Radiation Oncology | Admitting: Radiation Oncology

## 2023-02-24 DIAGNOSIS — Z952 Presence of prosthetic heart valve: Secondary | ICD-10-CM

## 2023-02-24 DIAGNOSIS — Z7901 Long term (current) use of anticoagulants: Secondary | ICD-10-CM | POA: Diagnosis not present

## 2023-02-24 DIAGNOSIS — C50412 Malignant neoplasm of upper-outer quadrant of left female breast: Secondary | ICD-10-CM | POA: Diagnosis not present

## 2023-02-24 LAB — RAD ONC ARIA SESSION SUMMARY
Course Elapsed Days: 22
Plan Fractions Treated to Date: 15
Plan Prescribed Dose Per Fraction: 2.67 Gy
Plan Total Fractions Prescribed: 15
Plan Total Prescribed Dose: 40.05 Gy
Reference Point Dosage Given to Date: 40.05 Gy
Reference Point Session Dosage Given: 2.67 Gy
Session Number: 15

## 2023-02-24 LAB — POCT INR: INR: 4.2 — AB (ref 2.0–3.0)

## 2023-02-24 NOTE — Patient Instructions (Addendum)
Description   Do not take any warfarin today then continue taking warfarin 1 tablet daily except 1/2 tablet on Mondays and Fridays. Have some leafy veggies tomorrow or Saturday and stay consistent with greens each week.   Repeat INR in 2 weeks.     [facility] [phone]

## 2023-02-25 ENCOUNTER — Ambulatory Visit
Admission: RE | Admit: 2023-02-25 | Discharge: 2023-02-25 | Disposition: A | Discharge status: Home / Self Care | Payer: PPO | Source: Ambulatory Visit | Attending: Radiation Oncology | Admitting: Radiation Oncology

## 2023-02-25 ENCOUNTER — Ambulatory Visit: Payer: HMO

## 2023-02-25 ENCOUNTER — Other Ambulatory Visit: Payer: Self-pay

## 2023-02-25 DIAGNOSIS — C50412 Malignant neoplasm of upper-outer quadrant of left female breast: Secondary | ICD-10-CM | POA: Diagnosis not present

## 2023-02-25 LAB — RAD ONC ARIA SESSION SUMMARY
Course Elapsed Days: 23
Plan Fractions Treated to Date: 1
Plan Prescribed Dose Per Fraction: 2.4 Gy
Plan Total Fractions Prescribed: 5
Plan Total Prescribed Dose: 12 Gy
Reference Point Dosage Given to Date: 2.4 Gy
Reference Point Session Dosage Given: 2.4 Gy
Session Number: 16

## 2023-02-26 ENCOUNTER — Other Ambulatory Visit: Payer: Self-pay | Admitting: Cardiovascular Disease

## 2023-02-26 DIAGNOSIS — Z7901 Long term (current) use of anticoagulants: Secondary | ICD-10-CM

## 2023-02-28 ENCOUNTER — Ambulatory Visit
Admission: RE | Admit: 2023-02-28 | Discharge: 2023-02-28 | Disposition: A | Discharge status: Home / Self Care | Payer: HMO | Source: Ambulatory Visit | Attending: Radiation Oncology | Admitting: Radiation Oncology

## 2023-02-28 ENCOUNTER — Other Ambulatory Visit: Payer: Self-pay

## 2023-02-28 DIAGNOSIS — Z170 Estrogen receptor positive status [ER+]: Secondary | ICD-10-CM | POA: Insufficient documentation

## 2023-02-28 DIAGNOSIS — C50411 Malignant neoplasm of upper-outer quadrant of right female breast: Secondary | ICD-10-CM | POA: Diagnosis present

## 2023-02-28 LAB — RAD ONC ARIA SESSION SUMMARY
Course Elapsed Days: 26
Plan Fractions Treated to Date: 2
Plan Prescribed Dose Per Fraction: 2.4 Gy
Plan Total Fractions Prescribed: 5
Plan Total Prescribed Dose: 12 Gy
Reference Point Dosage Given to Date: 4.8 Gy
Reference Point Session Dosage Given: 2.4 Gy
Session Number: 17

## 2023-03-01 ENCOUNTER — Inpatient Hospital Stay: Payer: HMO

## 2023-03-01 ENCOUNTER — Encounter: Payer: Self-pay | Admitting: Hematology

## 2023-03-01 ENCOUNTER — Ambulatory Visit: Payer: HMO

## 2023-03-01 ENCOUNTER — Other Ambulatory Visit: Payer: Self-pay

## 2023-03-01 ENCOUNTER — Ambulatory Visit
Admission: RE | Admit: 2023-03-01 | Discharge: 2023-03-01 | Disposition: A | Discharge status: Home / Self Care | Payer: HMO | Source: Ambulatory Visit | Attending: Radiation Oncology | Admitting: Radiation Oncology

## 2023-03-01 ENCOUNTER — Inpatient Hospital Stay (HOSPITAL_BASED_OUTPATIENT_CLINIC_OR_DEPARTMENT_OTHER): Payer: HMO | Admitting: Hematology

## 2023-03-01 VITALS — BP 164/93 | HR 85 | Temp 98.1°F | Resp 17 | Ht 65.0 in | Wt 170.1 lb

## 2023-03-01 DIAGNOSIS — C50411 Malignant neoplasm of upper-outer quadrant of right female breast: Secondary | ICD-10-CM | POA: Insufficient documentation

## 2023-03-01 DIAGNOSIS — C50412 Malignant neoplasm of upper-outer quadrant of left female breast: Secondary | ICD-10-CM

## 2023-03-01 DIAGNOSIS — Z1721 Progesterone receptor positive status: Secondary | ICD-10-CM | POA: Insufficient documentation

## 2023-03-01 DIAGNOSIS — Z79899 Other long term (current) drug therapy: Secondary | ICD-10-CM | POA: Insufficient documentation

## 2023-03-01 DIAGNOSIS — Z79810 Long term (current) use of selective estrogen receptor modulators (SERMs): Secondary | ICD-10-CM | POA: Insufficient documentation

## 2023-03-01 DIAGNOSIS — M8580 Other specified disorders of bone density and structure, unspecified site: Secondary | ICD-10-CM | POA: Insufficient documentation

## 2023-03-01 DIAGNOSIS — Z7901 Long term (current) use of anticoagulants: Secondary | ICD-10-CM | POA: Insufficient documentation

## 2023-03-01 DIAGNOSIS — I10 Essential (primary) hypertension: Secondary | ICD-10-CM | POA: Insufficient documentation

## 2023-03-01 DIAGNOSIS — Z882 Allergy status to sulfonamides status: Secondary | ICD-10-CM | POA: Insufficient documentation

## 2023-03-01 DIAGNOSIS — Z90710 Acquired absence of both cervix and uterus: Secondary | ICD-10-CM | POA: Insufficient documentation

## 2023-03-01 DIAGNOSIS — I252 Old myocardial infarction: Secondary | ICD-10-CM | POA: Insufficient documentation

## 2023-03-01 DIAGNOSIS — Z170 Estrogen receptor positive status [ER+]: Secondary | ICD-10-CM

## 2023-03-01 DIAGNOSIS — Z888 Allergy status to other drugs, medicaments and biological substances status: Secondary | ICD-10-CM | POA: Insufficient documentation

## 2023-03-01 DIAGNOSIS — Z923 Personal history of irradiation: Secondary | ICD-10-CM | POA: Insufficient documentation

## 2023-03-01 DIAGNOSIS — C50811 Malignant neoplasm of overlapping sites of right female breast: Secondary | ICD-10-CM

## 2023-03-01 DIAGNOSIS — Z803 Family history of malignant neoplasm of breast: Secondary | ICD-10-CM

## 2023-03-01 DIAGNOSIS — Z885 Allergy status to narcotic agent status: Secondary | ICD-10-CM | POA: Insufficient documentation

## 2023-03-01 LAB — RAD ONC ARIA SESSION SUMMARY
Course Elapsed Days: 27
Plan Fractions Treated to Date: 3
Plan Prescribed Dose Per Fraction: 2.4 Gy
Plan Total Fractions Prescribed: 5
Plan Total Prescribed Dose: 12 Gy
Reference Point Dosage Given to Date: 7.2 Gy
Reference Point Session Dosage Given: 2.4 Gy
Session Number: 18

## 2023-03-01 LAB — CBC WITH DIFFERENTIAL (CANCER CENTER ONLY)
Abs Immature Granulocytes: 0.02 10*3/uL (ref 0.00–0.07)
Basophils Absolute: 0 10*3/uL (ref 0.0–0.1)
Basophils Relative: 1 %
Eosinophils Absolute: 0.1 10*3/uL (ref 0.0–0.5)
Eosinophils Relative: 2 %
HCT: 36.3 % (ref 36.0–46.0)
Hemoglobin: 12.4 g/dL (ref 12.0–15.0)
Immature Granulocytes: 0 %
Lymphocytes Relative: 12 %
Lymphs Abs: 0.6 10*3/uL — ABNORMAL LOW (ref 0.7–4.0)
MCH: 31.1 pg (ref 26.0–34.0)
MCHC: 34.2 g/dL (ref 30.0–36.0)
MCV: 91 fL (ref 80.0–100.0)
Monocytes Absolute: 0.3 10*3/uL (ref 0.1–1.0)
Monocytes Relative: 7 %
Neutro Abs: 3.8 10*3/uL (ref 1.7–7.7)
Neutrophils Relative %: 78 %
Platelet Count: 255 10*3/uL (ref 150–400)
RBC: 3.99 MIL/uL (ref 3.87–5.11)
RDW: 12.8 % (ref 11.5–15.5)
WBC Count: 4.8 10*3/uL (ref 4.0–10.5)
nRBC: 0 % (ref 0.0–0.2)

## 2023-03-01 LAB — CMP (CANCER CENTER ONLY)
ALT: 13 U/L (ref 0–44)
AST: 22 U/L (ref 15–41)
Albumin: 4.2 g/dL (ref 3.5–5.0)
Alkaline Phosphatase: 72 U/L (ref 38–126)
Anion gap: 5 (ref 5–15)
BUN: 18 mg/dL (ref 8–23)
CO2: 31 mmol/L (ref 22–32)
Calcium: 9.7 mg/dL (ref 8.9–10.3)
Chloride: 105 mmol/L (ref 98–111)
Creatinine: 0.76 mg/dL (ref 0.44–1.00)
GFR, Estimated: >60 mL/min (ref >60)
Glucose, Bld: 100 mg/dL — ABNORMAL HIGH (ref 70–99)
Potassium: 3.5 mmol/L (ref 3.5–5.1)
Sodium: 141 mmol/L (ref 135–145)
Total Bilirubin: 0.9 mg/dL (ref 0.0–1.2)
Total Protein: 6.8 g/dL (ref 6.5–8.1)

## 2023-03-01 MED ORDER — RADIAPLEXRX EX GEL
Freq: Once | CUTANEOUS | Status: AC
Start: 2023-03-01 — End: 2023-03-01

## 2023-03-01 MED ORDER — TAMOXIFEN CITRATE 20 MG PO TABS: 20.0000 mg | ORAL_TABLET | Freq: Every day | ORAL | 3 refills | Status: DC

## 2023-03-01 NOTE — Assessment & Plan Note (Addendum)
 Invasive lobular carcinoma, pT2N0M0 stage IA, ER+/PR+/HER2-, (+) LCIS  -Diagosed in 10/2022, discovered by screening mammogram -Status post bilateral lumpectomy, which showed 2 invasive lobular carcinoma in left breast, 1 with focally positive posterior margin.  -Will complete adjuvant radiation therapy 07/15/2023 -She is awaiting to try adjuvant tamoxifen 

## 2023-03-01 NOTE — Assessment & Plan Note (Addendum)
-  Invasive ductal carcinoma, pT1aN0M0, ER+/PR+/HER2- -Diagnosed in October 2024, discovered on screening mammogram -Status post right lumpectomy, which showed a 0.4 cm invasive ductal carcinoma with negative margin -she is willing to try tamoxifen

## 2023-03-01 NOTE — Progress Notes (Signed)
 [facility]   [phone] [phone]   Clinic Follow up Note   Rose Care Team: Auston Opal, DO as PCP - General (Family Medicine) Croitoru, Jerel, MD as PCP - Cardiology (Cardiology) Mealor, Eulas BRAVO, MD as PCP - Electrophysiology (Cardiology) Vernetta Berg, MD as Consulting Physician (General Surgery) Lanny Callander, MD as Consulting Physician (Hematology) Shannon Agent, MD as Consulting Physician (Radiation Oncology) Glean Stephane BROCKS, RN as Registered Nurse Tyree Nanetta SAILOR, RN as Registered Nurse  Date of Service:  03/01/2023  CHIEF COMPLAINT: f/u of bilateral breast cancer   CURRENT THERAPY:  Adjuvant radiation  Oncology History   Malignant neoplasm of upper-outer quadrant of left breast in female, estrogen receptor positive (HCC) Invasive lobular carcinoma, pT2N0M0 stage IA, ER+/PR+/HER2-, (+) LCIS  -Diagosed in 10/2022, discovered by screening mammogram -Status post bilateral lumpectomy, which showed 2 invasive lobular carcinoma in left breast, 1 with focally positive posterior margin.  -Will complete adjuvant radiation therapy 07/15/2023 -She is awaiting to try adjuvant tamoxifen   Malignant neoplasm of upper-outer quadrant of right breast in female, estrogen receptor positive (HCC) -Invasive ductal carcinoma, pT1aN0M0, ER+/PR+/HER2- -Diagnosed in October 2024, discovered on screening mammogram -Status post right lumpectomy, which showed a 0.4 cm invasive ductal carcinoma with negative margin -she is willing to try tamoxifen     Assessment and Plan    Bilateral Breast Cancer (Lobular Carcinoma) Completing radiation therapy this week. Right breast had a very small tumor; left breast had two tumors, one larger. Surgery with a single incision removed both tumors, but positive margins necessitated radiation. Discussed tamoxifen  to reduce recurrence risk. Side effects include hot flashes, joint stiffness, vaginal discharge, and a 4-5%  risk of blood clots. On warfarin, requiring frequent INR monitoring. Rose agreed to start tamoxifen  after radiation recovery. - Start tamoxifen  20 mg once daily beginning March 26, 2023 - Monitor INR every two weeks for two months after starting tamoxifen  - Continue annual mammograms - Perform regular breast exams during follow-up visits - Educate on signs of blood clots and advise to report any new or unusual pain, especially in Sharon legs - Schedule follow-up appointment in three months  Hypertension Reports intermittent hypertension, with readings up to 140 mmHg. Recently taken off amlodipine  by primary care physician due to hypotension causing vertigo. Blood pressure management to be followed up with primary care physician. - Follow up with primary care physician for blood pressure management  Osteopenia Osteopenia with no recent bone density scan. Primary care physician previously indicated condition was well-managed. - Consider follow-up bone density scan to reassess osteopenia status  General Health Maintenance Discussed importance of staying active and monitoring weight, cholesterol, and blood pressure, especially in light of tamoxifen  therapy. - Encourage regular physical activity - Monitor weight, cholesterol, and blood pressure regularly  Plan -She will finish adjuvant radiation later this week -I recommend adjuvant tamoxifen  for 5 years, prescription called in today, she will start on March 1 - Follow up with cardiologist on April 05, 2023 - Follow up with Coumadin  clinic next Thursday, I recommend more frequent Coumadin  checkup every 2 weeks for Sharon first 2 months of tamoxifen  due to drug-drug interaction, pt will her Coumadin  clinic -Schedule survivorship in 3 months, lab and follow-up with me in 6 months   SUMMARY OF ONCOLOGIC HISTORY: Oncology History  Malignant neoplasm of upper-outer quadrant of left breast in female, estrogen receptor positive (HCC)  11/05/2022  Cancer Staging   Staging form: Breast, AJCC 8th Edition - Clinical stage from 11/05/2022: Stage  IA (cT1c, cN0, cM0, G2, ER+, PR+, HER2-) - Signed by Lanny Callander, MD on 11/17/2022 Stage prefix: Initial diagnosis Histologic grading system: 3 grade system   11/15/2022 Initial Diagnosis   Malignant neoplasm of upper-outer quadrant of left breast in female, estrogen receptor positive (HCC)   12/03/2022 Genetic Testing   Negative [facility] CancerNext+RNAinsight Panel.  Report date is 12/03/2022.   Sharon [facility] CancerNext+RNAinsight Panel includes sequencing, rearrangement analysis, and RNA analysis for Sharon following 34 genes: APC, ATM, BARD1, BMPR1A, BRCA1, BRCA2, BRIP1, CDH1, CDK4, CDKN2A, CHEK2, DICER1, MLH1, MSH2, MSH6, MUTYH, NF1, NTHL1, PALB2, PMS2, PTEN, RAD51C, RAD51D, SMAD4, SMARCA4, STK11 and TP53 (sequencing and deletion/duplication); AXIN2, HOXB13, MSH3, POLD1 and POLE (sequencing only); EPCAM and GREM1 (deletion/duplication only).   12/29/2022 Cancer Staging   Staging form: Breast, AJCC 8th Edition - Pathologic stage from 12/29/2022: Stage IA (pT2, pN0, cM0, G2, ER+, PR+, HER2-) - Signed by Lanny Callander, MD on 01/28/2023 Histologic grading system: 3 grade system Residual tumor (R): R0 - None   Malignant neoplasm of upper-outer quadrant of right breast in female, estrogen receptor positive (HCC)  11/05/2022 Cancer Staging   Staging form: Breast, AJCC 8th Edition - Clinical stage from 11/05/2022: Stage IA (cT1b, cN0, cM0, G1, ER+, PR+, HER2-) - Signed by Lanny Callander, MD on 11/17/2022 Stage prefix: Initial diagnosis Histologic grading system: 3 grade system   11/15/2022 Initial Diagnosis   Malignant neoplasm of upper-outer quadrant of right breast in female, estrogen receptor positive (HCC)   12/03/2022 Genetic Testing   Negative [facility] CancerNext+RNAinsight Panel.  Report date is 12/03/2022.   Sharon [facility] CancerNext+RNAinsight Panel includes sequencing, rearrangement analysis, and RNA analysis for Sharon  following 34 genes: APC, ATM, BARD1, BMPR1A, BRCA1, BRCA2, BRIP1, CDH1, CDK4, CDKN2A, CHEK2, DICER1, MLH1, MSH2, MSH6, MUTYH, NF1, NTHL1, PALB2, PMS2, PTEN, RAD51C, RAD51D, SMAD4, SMARCA4, STK11 and TP53 (sequencing and deletion/duplication); AXIN2, HOXB13, MSH3, POLD1 and POLE (sequencing only); EPCAM and GREM1 (deletion/duplication only).   12/29/2022 Cancer Staging   Staging form: Breast, AJCC 8th Edition - Pathologic stage from 12/29/2022: Stage IA (pT1a, pN0, cM0, G1, ER+, PR+, HER2-) - Signed by Lanny Callander, MD on 01/28/2023 Histologic grading system: 3 grade system Residual tumor (R): R0 - None      Discussed Sharon use of AI scribe software for clinical note transcription with Sharon Rose, who gave verbal consent to proceed.  History of Present Illness   Sharon Rose, an 83 year old female with a history of bilateral breast cancer, presents for a follow-up visit. Sharon Rose recently completed radiation therapy and had surgery on both breasts. Sharon Rose reports no major issues following these treatments. Sharon Rose also has a history of high blood pressure, which has been elevated today. Sharon Rose's primary care physician previously discontinued amlodipine  due to concerns of low blood pressure causing vertigo. Sharon Rose also reports a history of vertigo, which has improved since Sharon discontinuation of amlodipine . Sharon Rose is currently on warfarin for an unspecified condition. Sharon Rose also reports a history of hysterectomy and possible osteopenia. Sharon Rose has lost 15 pounds since her cancer diagnosis, which was not intentional. Sharon Rose also reports a history of back pain due to deteriorating discs.         All other systems were reviewed with Sharon Rose and are negative.  MEDICAL HISTORY:  Past Medical History:  Diagnosis Date   Age-related memory disorder 12/20/2022   pt reports   Breast cancer (HCC)    CAD (coronary artery disease)    NUCLEAR  STRESS TEST, 10/07/2008 -  no ischemia   Dyspnea on exertion    H/O aortic valve replacement    2D ECHO, 04/26/2011 - EF 50-55%, normal-mild   Hypertension    NSTEMI (non-ST elevated myocardial infarction) (HCC) 12/2004   Secondary to occlusion of OM 2, small vessel and medical therapy recommended; cath 2011 with no CAD    SURGICAL HISTORY: Past Surgical History:  Procedure Laterality Date   ABDOMINAL HYSTERECTOMY     AORTIC VALVE REPLACEMENT  09/12/2004   St. Jude mechanical aortic valve, model #23AK-501, serial #[id number]   BREAST LUMPECTOMY WITH RADIOACTIVE SEED LOCALIZATION Bilateral 12/29/2022   Procedure: BILATERAL RADIOACTIVE SEED GUIDED LUMPECTOMIES;  Surgeon: Vernetta Berg, MD;  Location: [facility];  Service: General;  Laterality: Bilateral;   CARDIAC CATHETERIZATION Left 03/25/2009   Normal cors   CARDIAC CATHETERIZATION  01/20/2005   Medical management for occluded and possibly revascularized OM 2, string sign seen   CARDIAC CATHETERIZATION  08/11/2004   Elective aortic valve replacement, severe aortic valvre stenosis with functional class II to III symptomatology    I have reviewed Sharon social history and family history with Sharon Rose and they are unchanged from previous note.  ALLERGIES:  is allergic to amlodipine  besylate, clindamycin hcl, codeine, gabapentin, hydrocodone, nitroglycerin , statins, sulfa antibiotics, verapamil, and diclofenac sodium.  MEDICATIONS:  Current Outpatient Medications  Medication Sig Dispense Refill   tamoxifen  (NOLVADEX ) 20 MG tablet Take 1 tablet (20 mg total) by mouth daily. 30 tablet 3   aspirin  EC 81 MG tablet Take 81 mg by mouth daily.     calcium  carbonate (OS-CAL) 600 MG TABS Take 600 mg by mouth 2 (two) times daily with a meal.     losartan  (COZAAR ) 100 MG tablet TAKE 1 TABLET BY MOUTH EVERY DAY (STOP TAKING Sharon LOSARTAN -HCTZ COMBO TAB) 90 tablet 1   metoprolol  succinate (TOPROL -XL) 25 MG 24 hr tablet TAKE 1/2 TABLET BY MOUTH EVERY DAY 45  tablet 3   Multiple Vitamin (MULTIVITAMIN) tablet Take 1 tablet by mouth daily.     omeprazole (PRILOSEC) 20 MG capsule Take 20 mg by mouth daily.     phenazopyridine  (PYRIDIUM ) 200 MG tablet Take 1 tablet (200 mg total) by mouth 3 (three) times daily. 6 tablet 0   rosuvastatin  (CRESTOR ) 5 MG tablet Take 5 mg by mouth every evening.      traMADol  (ULTRAM ) 50 MG tablet Take 1 tablet (50 mg total) by mouth every 6 (six) hours as needed for moderate pain (pain score 4-6) or severe pain (pain score 7-10). 20 tablet 0   warfarin (COUMADIN ) 5 MG tablet TAKE 1/2 TABLET TO 1 TABLET BY MOUTH DAILY AS DIRECTED BY COUMADIN  CLINIC 90 tablet 0   No current facility-administered medications for this visit.    PHYSICAL EXAMINATION: ECOG PERFORMANCE STATUS: 1 - Symptomatic but completely ambulatory  Vitals:   03/01/23 1303 03/01/23 1304  BP: (!) 154/87 (!) 164/93  Pulse: 85   Resp: 17   Temp: 98.1 F (36.7 C)   SpO2: 98%    Wt Readings from Last 3 Encounters:  03/01/23 170 lb 2 oz (77.2 kg)  01/31/23 171 lb 6.4 oz (77.7 kg)  01/24/23 171 lb (77.6 kg)     GENERAL:alert, no distress and comfortable SKIN: skin color, texture, turgor are normal, no rashes or significant lesions EYES: normal, Conjunctiva are pink and non-injected, sclera clear NECK: supple, thyroid  normal size, non-tender, without nodularity LYMPH:  no palpable lymphadenopathy in Sharon cervical, axillary  LUNGS: clear to auscultation and percussion with normal breathing effort HEART: regular rate & rhythm and no murmurs and no lower extremity edema ABDOMEN:abdomen soft, non-tender and normal bowel sounds Musculoskeletal:no cyanosis of digits and no clubbing  NEURO: alert & oriented x 3 with fluent speech, no focal motor/sensory deficits   LABORATORY DATA:  I have reviewed Sharon data as listed    Latest Ref Rng & Units 03/01/2023   12:16 PM 11/17/2022   12:30 PM 07/01/2019   10:02 AM  CBC  WBC 4.0 - 10.5 K/uL 4.8  5.7  4.9    Hemoglobin 12.0 - 15.0 g/dL 87.5  87.0  86.1   Hematocrit 36.0 - 46.0 % 36.3  37.6  40.8   Platelets 150 - 400 K/uL 255  253  325         Latest Ref Rng & Units 03/01/2023   12:16 PM 11/17/2022   12:30 PM 07/01/2019   11:27 AM  CMP  Glucose 70 - 99 mg/dL 899  81    BUN 8 - 23 mg/dL 18  21    Creatinine 9.55 - 1.00 mg/dL 9.23  9.19    Sodium 864 - 145 mmol/L 141  139    Potassium 3.5 - 5.1 mmol/L 3.5  4.2    Chloride 98 - 111 mmol/L 105  104    CO2 22 - 32 mmol/L 31  32    Calcium  8.9 - 10.3 mg/dL 9.7  89.9    Total Protein 6.5 - 8.1 g/dL 6.8  7.0  7.1   Total Bilirubin 0.0 - 1.2 mg/dL 0.9  0.7  0.9   Alkaline Phos 38 - 126 U/L 72  68  60   AST 15 - 41 U/L 22  24  29    ALT 0 - 44 U/L 13  13  17        RADIOGRAPHIC STUDIES: I have personally reviewed Sharon radiological images as listed and agreed with Sharon findings in Sharon report. No results found.    No orders of Sharon defined types were placed in this encounter.  All questions were answered. Sharon Rose knows to call Sharon clinic with any problems, questions or concerns. No barriers to learning was detected. Sharon total time spent in Sharon appointment was 30 minutes.     Onita Mattock, MD 03/01/2023

## 2023-03-02 ENCOUNTER — Other Ambulatory Visit: Payer: Self-pay | Admitting: Hematology

## 2023-03-02 ENCOUNTER — Ambulatory Visit
Admission: RE | Admit: 2023-03-02 | Discharge: 2023-03-02 | Discharge status: Home / Self Care | Payer: HMO | Source: Ambulatory Visit | Attending: Radiation Oncology | Admitting: Radiation Oncology

## 2023-03-02 ENCOUNTER — Ambulatory Visit: Payer: HMO

## 2023-03-02 ENCOUNTER — Telehealth: Payer: Self-pay | Admitting: *Deleted

## 2023-03-02 ENCOUNTER — Other Ambulatory Visit: Payer: Self-pay

## 2023-03-02 DIAGNOSIS — C50411 Malignant neoplasm of upper-outer quadrant of right female breast: Secondary | ICD-10-CM | POA: Diagnosis not present

## 2023-03-02 LAB — RAD ONC ARIA SESSION SUMMARY
Course Elapsed Days: 28
Plan Fractions Treated to Date: 4
Plan Prescribed Dose Per Fraction: 2.4 Gy
Plan Total Fractions Prescribed: 5
Plan Total Prescribed Dose: 12 Gy
Reference Point Dosage Given to Date: 9.6 Gy
Reference Point Session Dosage Given: 2.4 Gy
Session Number: 19

## 2023-03-02 NOTE — Telephone Encounter (Signed)
 Received a message from Dr Francyne stating: Croitoru, Jerel, MD  P Cv Div Anticoagulation She is starting tamoxifen  and due to warfarin interaction will need more intensive INR follow up for first 2-4 weeks, please.  Also, per 03/01/23 Dr. Demetra note it states: - Follow up with Coumadin  clinic next Thursday, I recommend more frequent Coumadin  checkup every 2 weeks for the first 2 months of tamoxifen  due to drug-drug interaction, pt will her Coumadin  clinic.  Pt has an appt pending next week on 03/10/23 and a note placed to follow INR closely.

## 2023-03-03 ENCOUNTER — Other Ambulatory Visit: Payer: Self-pay

## 2023-03-03 ENCOUNTER — Ambulatory Visit (HOSPITAL_COMMUNITY): Payer: HMO | Attending: Cardiovascular Disease

## 2023-03-03 ENCOUNTER — Ambulatory Visit
Admission: RE | Admit: 2023-03-03 | Discharge: 2023-03-03 | Disposition: A | Discharge status: Home / Self Care | Payer: HMO | Source: Ambulatory Visit | Attending: Radiation Oncology | Admitting: Radiation Oncology

## 2023-03-03 ENCOUNTER — Encounter: Payer: Self-pay | Admitting: Cardiovascular Disease

## 2023-03-03 ENCOUNTER — Ambulatory Visit: Payer: HMO

## 2023-03-03 DIAGNOSIS — Z952 Presence of prosthetic heart valve: Secondary | ICD-10-CM | POA: Insufficient documentation

## 2023-03-03 DIAGNOSIS — C50411 Malignant neoplasm of upper-outer quadrant of right female breast: Secondary | ICD-10-CM | POA: Diagnosis not present

## 2023-03-03 LAB — RAD ONC ARIA SESSION SUMMARY
Course Elapsed Days: 29
Plan Fractions Treated to Date: 5
Plan Prescribed Dose Per Fraction: 2.4 Gy
Plan Total Fractions Prescribed: 5
Plan Total Prescribed Dose: 12 Gy
Reference Point Dosage Given to Date: 12 Gy
Reference Point Session Dosage Given: 2.4 Gy
Session Number: 20

## 2023-03-03 LAB — ECHOCARDIOGRAM COMPLETE
AV Mean grad: 12 mm[Hg]
AV Peak grad: 20.4 mm[Hg]
Ao pk vel: 2.26 m/s
Area-P 1/2: 3.48 cm2
Est EF: 55
S' Lateral: 3.8 cm

## 2023-03-04 NOTE — Radiation Completion Notes (Addendum)
  Radiation Oncology         [phone] ________________________________  Name: Sharon Rose MRN: [id number]  Date of Service: 03/03/2023  DOB: 08/07/1940  End of Treatment Note     Diagnosis: Stage IA (cT1c, cN0, cM0) Left Breast UOQ, Invasive lobular carcinoma with focal grade 1 IDC and focal LCIS , ER+ / PR+ / Her2-, Grade 2      ==========DELIVERED PLANS==========  First Treatment Date: 2023-02-02 Last Treatment Date: 2023-03-03   Plan Name: Breast_L Site: Breast, Left Technique: 3D Mode: Photon Dose Per Fraction: 2.67 Gy Prescribed Dose (Delivered / Prescribed): 40.05 Gy / 40.05 Gy Prescribed Fxs (Delivered / Prescribed): 15 / 15   Plan Name: Breast_L_Bst Site: Breast, Left Technique: 3D Mode: Photon Dose Per Fraction: 2.4 Gy Prescribed Dose (Delivered / Prescribed): 12 Gy / 12 Gy Prescribed Fxs (Delivered / Prescribed): 5 / 5   ====================================   The patient tolerated radiation. She developed fatigue and anticipated skin changes in the treatment field.   The patient will return in one month and will continue follow up with Dr. Lanny as well.      Ronita Due, PA-C

## 2023-03-10 ENCOUNTER — Ambulatory Visit: Payer: HMO

## 2023-03-10 ENCOUNTER — Other Ambulatory Visit: Payer: Self-pay | Admitting: Internal Medicine

## 2023-03-10 ENCOUNTER — Ambulatory Visit: Payer: HMO | Attending: Cardiovascular Disease

## 2023-03-10 DIAGNOSIS — Z952 Presence of prosthetic heart valve: Secondary | ICD-10-CM | POA: Diagnosis not present

## 2023-03-10 DIAGNOSIS — R1011 Right upper quadrant pain: Secondary | ICD-10-CM

## 2023-03-10 LAB — POCT INR: INR: 3.1 — AB (ref 2.0–3.0)

## 2023-03-10 NOTE — Patient Instructions (Signed)
Description   Only take 1/2 tablet today and then continue taking warfarin 1 tablet daily except 1/2 tablet on Mondays and Fridays.  Stay consistent with greens each week.   Repeat INR in 2 weeks.     [facility] [phone] * tamoxifen - starting on 03/26/23 (CHECK IN EVERY 2 WEEKS)*

## 2023-03-14 ENCOUNTER — Ambulatory Visit
Admission: RE | Admit: 2023-03-14 | Discharge: 2023-03-14 | Disposition: A | Discharge status: Home / Self Care | Payer: HMO | Source: Ambulatory Visit | Attending: Internal Medicine

## 2023-03-14 DIAGNOSIS — R1011 Right upper quadrant pain: Secondary | ICD-10-CM

## 2023-03-28 ENCOUNTER — Ambulatory Visit: Payer: HMO | Attending: Cardiovascular Disease

## 2023-03-28 DIAGNOSIS — Z7901 Long term (current) use of anticoagulants: Secondary | ICD-10-CM

## 2023-03-28 DIAGNOSIS — Z952 Presence of prosthetic heart valve: Secondary | ICD-10-CM

## 2023-03-28 LAB — POCT INR: INR: 1.9 — AB (ref 2.0–3.0)

## 2023-03-28 NOTE — Patient Instructions (Signed)
 continue taking warfarin 1 tablet daily except 1/2 tablet on Mondays and Fridays.  Stay consistent with greens each week.  Repeat INR in 2 weeks.     [facility] [phone] * tamoxifen 20 mg - starting on 03/26/23 (CHECK IN EVERY 2 WEEKS)*

## 2023-04-04 NOTE — Progress Notes (Signed)
 Cardiology office Note    Date:  04/10/2023   ID:  Sharon Rose, DOB 06/06/1940, MRN [id number]  PCP:  [name], DO  Cardiologist:  Thurmon Fair, MD  Electrophysiologist:  Maurice Small, MD   Evaluation Performed:  Follow-Up Visit  Chief Complaint: Dizziness History of Present Illness:    Sharon Rose is a 83 y.o. female with a mechanical aortic valve prosthesis, hypertension and hyperlipidemia, history of frequent PACs and episodes of second-degree AV block Mobitz type I without severe bradycardia.  She has not had any recent cardiovascular complaints.  She continues to have dizziness which can be brought on by simply moving her head from side-to-side or looking up.  It is not necessarily associate with changes in body position and does not sound like orthostatic hypotension.  Her blood pressure was moderately elevated when she first checked in, but was much lower when we rechecked it a few minutes later 137/82.  She also had normal blood pressure when checked on 03/10/2023 (130/84).  She only becomes short of breath if she is walking up a slope, especially if it is after she has eaten a meal.  She does not have chest pain at rest or with activity.  Not complaining much of palpitations today.  Has not experienced presyncope or syncope.  Denies lower extremity edema.  Unable to treat with beta-blockers due to symptomatic bradycardia, but has not had bradycardia since.  Note history of severe ankle swelling on higher doses of amlodipine.  She has not had any falls or bleeding problems and an INR has been mostly within target range, although just a week ago her INR was a little low at 1.9.  She gets a little short of breath if she has to walk a hilly route, again this is a bigger complaint if she is just eating a large meal.  She does not have orthopnea or PND or shortness of breath with household activities.  Follow-up echocardiogram performed just last month (03/03/2023  shows normal heart pumping strength and normal prosthetic aortic valve gradients, without insufficiency.  (Mean gradient 12 mmHg, dimensionless index 0.35).  Metabolic parameters are all in acceptable range with a hemoglobin A1c borderline for prediabetes at 5.8% and LDL cholesterol of 76 on rosuvastatin.  She has normal renal function.  Coronary CT angiography performed in September 2023 showed a calcium score of 77, less than average for age and gender, did not show any evidence of any meaningful coronary stenoses.  7-day arrhythmia monitor in 2023 showed roughly 20% PACs, nonsustained bursts of atrial tachycardia (maximum 19 beats, 10 seconds) but no atrial fibrillation.  There was also evidence of Mobitz type I second-degree AV block without significant bradycardia.  Bruce treadmill stress testing to evaluate for chronotropic incompetence showed that she was able to increase her heart rate to 141 bpm (100% of predicted).  She did have frequent PACs and may be a very brief burst of atrial fibrillation during that study.  She saw Dr. Nelly Laurence in the EP clinic who recommended observation since the PACs were not particularly symptomatic and there was evidence of AV conduction abnormalities.  Sharon Rose  had replacement of a severely stenotic, probably congenitally bicuspid aortic valve in 2006 with a 23 mm mechanical valve. She has HTN and dyslipidemia that is treated with a statin. Coronary angiography before surgery showed total occlusion of the mid second oblique marginal artery by preoperative coronary angiography in 2006, but no angina and no evidence  of ischemia by nuclear stress testing. Repeat coronary angiography in March 2011 did not confirm occlusion of the second oblique marginal branch.  Coronary CTA in 2023 did not show significant coronary stenosis and the calcium score was less than average for age and gender.  She has normal LV function and no history of embolic events. In 2014 she  presented with thiazide induced hypokalemia and bradycardia. Her heart rate normalized after electrolyte correction. We tried to stop her diuretic completely, but this was associated with ankle swelling and difficult to control blood pressure. She has a history of postoperative atrial fibrillation without detected recurrence since then, although she has frequent PACs and episodes of second-degree AV block Mobitz type I   Past Medical History:  Diagnosis Date   Age-related memory disorder 12/20/2022   pt reports   Breast cancer (HCC)    CAD (coronary artery disease)    NUCLEAR STRESS TEST, 10/07/2008 - no ischemia   Dyspnea on exertion    H/O aortic valve replacement    2D ECHO, 04/26/2011 - EF 50-55%, normal-mild   History of radiation therapy    Left breast-02/02/23-03/03/23- Dr. Antony Blackbird   Hypertension    NSTEMI (non-ST elevated myocardial infarction) (HCC) 12/2004   Secondary to occlusion of OM 2, small vessel and medical therapy recommended; cath 2011 with no CAD   Past Surgical History:  Procedure Laterality Date   ABDOMINAL HYSTERECTOMY     AORTIC VALVE REPLACEMENT  09/12/2004   St. Jude mechanical aortic valve, model #23AK-501, serial #[id number]   BREAST LUMPECTOMY WITH RADIOACTIVE SEED LOCALIZATION Bilateral 12/29/2022   Procedure: BILATERAL RADIOACTIVE SEED GUIDED LUMPECTOMIES;  Surgeon: [name], MD;  Location: [facility];  Service: General;  Laterality: Bilateral;   CARDIAC CATHETERIZATION Left 03/25/2009   Normal cors   CARDIAC CATHETERIZATION  01/20/2005   Medical management for occluded and possibly revascularized OM 2, string sign seen   CARDIAC CATHETERIZATION  08/11/2004   Elective aortic valve replacement, severe aortic valvre stenosis with functional class II to III symptomatology     Current Meds  Medication Sig   aspirin EC 81 MG tablet Take 81 mg by mouth daily.   calcium carbonate (OS-CAL) 600 MG TABS Take 600 mg by mouth 2 (two)  times daily with a meal.   metoprolol succinate (TOPROL-XL) 25 MG 24 hr tablet TAKE 1/2 TABLET BY MOUTH EVERY DAY   Multiple Vitamin (MULTIVITAMIN) tablet Take 1 tablet by mouth daily.   omeprazole (PRILOSEC) 20 MG capsule Take 20 mg by mouth daily.   rosuvastatin (CRESTOR) 5 MG tablet Take 5 mg by mouth every evening.    warfarin (COUMADIN) 5 MG tablet TAKE 1/2 TABLET TO 1 TABLET BY MOUTH DAILY AS DIRECTED BY COUMADIN CLINIC   [DISCONTINUED] losartan (COZAAR) 100 MG tablet TAKE 1 TABLET BY MOUTH EVERY DAY (STOP TAKING THE LOSARTAN-HCTZ COMBO TAB)     Allergies:   Amlodipine besylate, Clindamycin hcl, Codeine, Gabapentin, Hydrocodone, Nitroglycerin, Statins, Sulfa antibiotics, Verapamil, and Diclofenac sodium   Social History   Tobacco Use   Smoking status: Never   Smokeless tobacco: Never  Vaping Use   Vaping status: Never Used  Substance Use Topics   Alcohol use: No   Drug use: No     Family Hx: The patient's family history includes Fibrocystic breast disease in her maternal aunt.   Prior CV studies:    Labs/Other Tests and Data Reviewed:    ECHO 03/03/2023:    1. Left ventricular ejection fraction,  by estimation, is 55%. The left  ventricle has low normal function. The left ventricle has no regional wall  motion abnormalities. Left ventricular diastolic parameters are consistent  with Grade I diastolic  dysfunction (impaired relaxation).   2. Right ventricular systolic function is normal. The right ventricular  size is moderately enlarged.   3. Left atrial size was mildly dilated.   4. The mitral valve is degenerative. Trivial mitral valve regurgitation.   5. Etiology of regurgitant jet not well visualized. The aortic valve has  been repaired/replaced. Aortic valve regurgitation is mild. Aortic valve  mean gradient measures 12.0 mmHg.   6. Aortic dilatation noted. There is mild dilatation of the aortic root,  measuring 40 mm.   7. The inferior vena cava is normal in  size with greater than 50%  respiratory variability, suggesting right atrial pressure of 3 mmHg.   Comparison(s): No significant change from prior study. Prior images  reviewed side by side.     AV Vmax:           226.00 cm/s  AV Peak Grad:      20.4 mmHg  AV Mean Grad:      12.0 mmHg  LVOT/AV VTI ratio: 0.35    AORTA  Ao Root diam: 3.40 cm  Ao Asc diam:  4.00 cm   Coronary CT Angio 10/05/2021: 1. Minimal CAD in the proximal and mid LAD, <25% stenosis,  CADRADS 1.   2. Coronary calcium score is 77, which places the patient in the 44th percentile for age and sex matched control.   3. Normal coronary origins with left dominance.   4. 23 mm St. Jude mechanical aortic valve is present. Normal bileaflet disc motion.   Event monitor 10/05/2021    Predominant rhythm is normal sinus rhythm with normal circadian variation.   Rare and very brief episodes of nonsustained ventricular tachycardia are seen.   There are very frequent premature atrial contractions (roughly 20% of all beats) and there are very frequent but brief episodes of nonsustained supraventricular tachycardia (maximum 19 beats, roughly 10 seconds in duration).  The episodes of supraventricular tachycardia correlate well with the patient's symptoms.   There is no evidence of atrial fibrillation   Second-degree atrioventricular block Mobitz type I is frequently seen, both during sinus rhythm and during atrial tachycardia.  She states that   Abnormal event monitor due to the presence of very frequent premature atrial contractions and frequent brief episodes of nonsustained atrial tachycardia.   Also seen are frequent episodes of second-degree atrioventricular block note Mobitz type I, but significant bradycardia was not noted.   Atrial fibrillation was not seen.  ECG stress test 11/05/2021    No ST deviation was noted.   Prior study available for comparison from 10/07/2008.   Exercise stress test for chronotropic  competence. HR rose from 83 to 141 with exercise, however, frequent PAC's were noted and there appeared to be a short run of atrial fibrillation.  No ischemic changes were noted.     EKG: Ordered 01/31/2023 and personally reviewed shows SR w 1st deg AV block, a single PAC and a single PVC, LAFB  EKG Interpretation Date/Time:    Ventricular Rate:    PR Interval:    QRS Duration:    QT Interval:    QTC Calculation:   R Axis:      Text Interpretation:           Recent Labs: 03/01/2023: ALT 13; BUN 18; Creatinine 0.76; Hemoglobin  12.4; Platelet Count 255; Potassium 3.5; Sodium 141  12/22/2020 Creatinine 0.76, hemoglobin 13.3, potassium 3.8, ALT 12, hemoglobin A1c 5.6% Recent Lipid Panel Lab Results  Component Value Date/Time   CHOL 190 07/08/2014 10:53 AM   TRIG 188 (H) 07/08/2014 10:53 AM   HDL 46 07/08/2014 10:53 AM   CHOLHDL 4.1 07/08/2014 10:53 AM   LDLCALC 106 (H) 07/08/2014 10:53 AM   10/05/2022 Cholesterol 149, HDL 52, LDL 76, triglycerides 119 Hemoglobin A1c 5.8%  03/10/2023 Hemoglobin 13.1, creatinine 0.76, potassium 3.8, ALT 11  Wt Readings from Last 3 Encounters:  04/07/23 161 lb 12.8 oz (73.4 kg)  04/05/23 167 lb 3.2 oz (75.8 kg)  03/01/23 170 lb 2 oz (77.2 kg)     Objective:    Vital Signs:  BP 137/82   Pulse 72   Ht 5' 4.5" (1.638 m)   Wt 167 lb 3.2 oz (75.8 kg)   SpO2 94%   BMI 28.26 kg/m     General: Alert, oriented x3, no distress, overweight, but not obese Head: no evidence of trauma, PERRL, EOMI, no exophtalmos or lid lag, no myxedema, no xanthelasma; normal ears, nose and oropharynx Neck: normal jugular venous pulsations and no hepatojugular reflux; brisk carotid pulses without delay and no carotid bruits Chest: clear to auscultation, no signs of consolidation by percussion or palpation, normal fremitus, symmetrical and full respiratory excursions Cardiovascular: normal position and quality of the apical impulse, regular rhythm, crisp  mechanical valve clicks with a barely audible aortic ejection murmur, no murmurs, rubs or gallops Abdomen: no tenderness or distention, no masses by palpation, no abnormal pulsatility or arterial bruits, normal bowel sounds, no hepatosplenomegaly Extremities: no clubbing, cyanosis or edema; 2+ radial, ulnar and brachial pulses bilaterally; 2+ right femoral, posterior tibial and dorsalis pedis pulses; 2+ left femoral, posterior tibial and dorsalis pedis pulses; no subclavian or femoral bruits Neurological: grossly nonfocal Psych: Normal mood and affect     ASSESSMENT & PLAN:    1. Second degree atrioventricular block, Mobitz (type) I   2. PAT (paroxysmal atrial tachycardia) (HCC)   3. History of mechanical aortic valve replacement   4. Long term current use of anticoagulant therapy   5. Essential hypertension   6. Hypercholesterolemia         2nd deg AVB, MT1: Asymptomatic and without any documentation of severe bradycardia.  Avoid beta-blockers. PAT: She has very frequent PACs and bursts of brief paroxysmal atrial tachycardia, but has not had atrial fibrillation documented since her valve replacement surgery.  No treatment recommended after consultation with EP. Mechanical AVR: Able and normal aortic valve parameters on echocardiography performed last month. Warfarin anticoagulation: Likely subtherapeutic a week ago, but usually normal range.  No bleeding problems. HTN: Avoid beta-blockers and calcium channel blockers due to previous side effects.  Currently with acceptable control. HLP: Target LDL less than 100 since she has a relatively low calcium score no significant coronary stenoses or PAD. Cognitive changes: She did not bring this up as a concern today.  Patient Instructions  Medication Instructions:  Your physician recommends that you continue on your current medications as directed. Please refer to the Current Medication list given to you today.  *If you need a refill on  your cardiac medications before your next appointment, please call your pharmacy*    Follow-Up: At [facility], you and your health needs are our priority.  As part of our continuing mission to provide you with exceptional heart care, we have created designated Provider Care  Teams.  These Care Teams include your primary Cardiologist (physician) and Advanced Practice Providers (APPs -  Physician Assistants and Nurse Practitioners) who all work together to provide you with the care you need, when you need it.  Your next appointment:   1 year(s)  Provider:   Thurmon Fair, MD         Signed, Thurmon Fair, MD  04/10/2023 1:12 PM    [facility]

## 2023-04-05 ENCOUNTER — Ambulatory Visit: Payer: PPO | Attending: Cardiovascular Disease | Admitting: Cardiovascular Disease

## 2023-04-05 ENCOUNTER — Encounter: Payer: Self-pay | Admitting: Radiation Oncology

## 2023-04-05 ENCOUNTER — Encounter: Payer: Self-pay | Admitting: Cardiovascular Disease

## 2023-04-05 VITALS — BP 137/82 | HR 72 | Ht 64.5 in | Wt 167.2 lb

## 2023-04-05 DIAGNOSIS — I10 Essential (primary) hypertension: Secondary | ICD-10-CM | POA: Diagnosis not present

## 2023-04-05 DIAGNOSIS — E7800 Pure hypercholesterolemia, unspecified: Secondary | ICD-10-CM | POA: Diagnosis not present

## 2023-04-05 DIAGNOSIS — R7303 Prediabetes: Secondary | ICD-10-CM | POA: Diagnosis not present

## 2023-04-05 DIAGNOSIS — Z7901 Long term (current) use of anticoagulants: Secondary | ICD-10-CM

## 2023-04-05 DIAGNOSIS — I2510 Atherosclerotic heart disease of native coronary artery without angina pectoris: Secondary | ICD-10-CM | POA: Diagnosis not present

## 2023-04-05 DIAGNOSIS — I4719 Other supraventricular tachycardia: Secondary | ICD-10-CM | POA: Diagnosis not present

## 2023-04-05 DIAGNOSIS — I441 Atrioventricular block, second degree: Secondary | ICD-10-CM

## 2023-04-05 DIAGNOSIS — Z952 Presence of prosthetic heart valve: Secondary | ICD-10-CM | POA: Diagnosis not present

## 2023-04-05 DIAGNOSIS — K2100 Gastro-esophageal reflux disease with esophagitis, without bleeding: Secondary | ICD-10-CM | POA: Diagnosis not present

## 2023-04-05 DIAGNOSIS — E782 Mixed hyperlipidemia: Secondary | ICD-10-CM | POA: Diagnosis not present

## 2023-04-05 NOTE — Patient Instructions (Signed)
Medication Instructions:  Your physician recommends that you continue on your current medications as directed. Please refer to the Current Medication list given to you today.  *If you need a refill on your cardiac medications before your next appointment, please call your pharmacy*  Follow-Up: At [facility], you and your health needs are our priority.  As part of our continuing mission to provide you with exceptional heart care, we have created designated Provider Care Teams.  These Care Teams include your primary Cardiologist (physician) and Advanced Practice Providers (APPs -  Physician Assistants and Nurse Practitioners) who all work together to provide you with the care you need, when you need it.  Your next appointment:   1 year(s)  Provider:   Thurmon Fair, MD

## 2023-04-06 NOTE — Progress Notes (Signed)
 Radiation Oncology         [phone] ________________________________  Name: Sharon Rose MRN: [id number]  Date: 04/07/2023  DOB: 01/12/1941  Follow-Up Visit Note  CC: [name], DO  [name], DO  No diagnosis found.  Diagnosis: Stage IA (cT1c, cN0, cM0) Left Breast UOQ, Invasive lobular carcinoma with focal grade 1 IDC and focal LCIS , ER+ / PR+ / Her2-, Grade 2   Interval Since Last Radiation:  1 month 8 days  First Treatment Date: 2023-02-02 Last Treatment Date: 2023-03-03   Plan Name: Breast_L Site: Breast, Left Technique: 3D Mode: Photon Dose Per Fraction: 2.67 Gy Prescribed Dose (Delivered / Prescribed): 40.05 Gy / 40.05 Gy Prescribed Fxs (Delivered / Prescribed): 15 / 15   Plan Name: Breast_L_Bst Site: Breast, Left Technique: 3D Mode: Photon Dose Per Fraction: 2.4 Gy Prescribed Dose (Delivered / Prescribed): 12 Gy / 12 Gy Prescribed Fxs (Delivered / Prescribed): 5 / 5  Narrative:  The patient returns today for routine follow-up. She was last seen in office on 01-24-23 for a reconsultation visit. Since then, she completed her radiation treatment. Patient developed fatigue and anticipated skin changes in the treatment field. Otherwise, no further side effects were reported. Patient continues to follow up with specialists to manager her chronic conditions.    Most recent follow up with Dr. Malachy Mood on 03-01-23, she reported tolerating her treatment regimen with no significant side effects. She presented for a US abdomen limited RUQ on 03-14-23 showing a mild increase in echogenicity of the liver, suggestive of hepatic steatosis and unremarkable appearance of the gallbladder.       No other significant oncologic interval history since the patient was last seen.                        Of note: she presented to UC on 02-13-23 for dysuria. Was placed on a cephalexin and phenazopyridine course.   Allergies:  is allergic to amlodipine besylate, clindamycin hcl,  codeine, gabapentin, hydrocodone, nitroglycerin, statins, sulfa antibiotics, verapamil, and diclofenac sodium.  Meds: Current Outpatient Medications  Medication Sig Dispense Refill   aspirin EC 81 MG tablet Take 81 mg by mouth daily.     calcium carbonate (OS-CAL) 600 MG TABS Take 600 mg by mouth 2 (two) times daily with a meal.     losartan (COZAAR) 100 MG tablet TAKE 1 TABLET BY MOUTH EVERY DAY (STOP TAKING THE LOSARTAN-HCTZ COMBO TAB) 90 tablet 1   metoprolol succinate (TOPROL-XL) 25 MG 24 hr tablet TAKE 1/2 TABLET BY MOUTH EVERY DAY 45 tablet 3   Multiple Vitamin (MULTIVITAMIN) tablet Take 1 tablet by mouth daily.     omeprazole (PRILOSEC) 20 MG capsule Take 20 mg by mouth daily.     phenazopyridine (PYRIDIUM) 200 MG tablet Take 1 tablet (200 mg total) by mouth 3 (three) times daily. (Patient not taking: Reported on 04/05/2023) 6 tablet 0   rosuvastatin (CRESTOR) 5 MG tablet Take 5 mg by mouth every evening.      tamoxifen (NOLVADEX) 20 MG tablet Take 1 tablet (20 mg total) by mouth daily. 30 tablet 3   traMADol (ULTRAM) 50 MG tablet Take 1 tablet (50 mg total) by mouth every 6 (six) hours as needed for moderate pain (pain score 4-6) or severe pain (pain score 7-10). (Patient not taking: Reported on 04/05/2023) 20 tablet 0   warfarin (COUMADIN) 5 MG tablet TAKE 1/2 TABLET TO 1 TABLET BY MOUTH DAILY AS DIRECTED BY  COUMADIN CLINIC 90 tablet 0   warfarin (COUMADIN) 5 MG tablet Take 5 mg by mouth.     No current facility-administered medications for this encounter.    Physical Findings: The patient is in no acute distress. Patient is alert and oriented.  vitals were not taken for this visit. .  No significant changes. Lungs are clear to auscultation bilaterally. Heart has regular rate and rhythm. No palpable cervical, supraclavicular, or axillary adenopathy. Abdomen soft, non-tender, normal bowel sounds.   Lab Findings: Lab Results  Component Value Date   WBC 4.8 03/01/2023   HGB 12.4  03/01/2023   HCT 36.3 03/01/2023   MCV 91.0 03/01/2023   PLT 255 03/01/2023    Radiographic Findings: US Abdomen Limited RUQ (LIVER/GB) Result Date: 03/14/2023 : PROCEDURE: ULTRASOUND ABDOMEN LIMITED HISTORY: Patient is a 83 y/o F with right upper quadrant pain for two weeks. COMPARISON: None available. TECHNIQUE: Two-dimensional grayscale and color Doppler ultrasound of the limited abdomen was performed. FINDINGS: The liver demonstrates a mildly increased grainy echotexture without intrahepatic biliary dilatation. No masses are visualized. There is normal hepatopetal flow visualized within the main portal vein. The gallbladder demonstrates normal anechoic echotexture without pericholecystic fluid or wall thickening. There are no gallstones. The common bile duct measures 0.3 cm. Negative sonographic Murphy's sign. IMPRESSION: 1. Mild increase in echogenicity of the liver, suggestive of hepatic steatosis. Unremarkable appearance of the gallbladder Thank you for allowing [place] to assist in the care of this patient. Electronically Signed   By: Lestine Box M.D.   On: 03/14/2023 20:05    Impression: Stage IA (cT1c, cN0, cM0) Left Breast UOQ, Invasive lobular carcinoma with focal grade 1 IDC and focal LCIS , ER+ / PR+ / Her2-, Grade 2    The patient is recovering from the effects of radiation.  ***  Plan:  ***   *** minutes of total time was spent for this patient encounter, including preparation, face-to-face counseling with the patient and coordination of care, physical exam, and documentation of the encounter. ____________________________________  Billie Lade, PhD, MD  This document serves as a record of services personally performed by Antony Blackbird, MD. It was created on his behalf by Herbie Saxon, a trained medical scribe. The creation of this record is based on the scribe's personal observations and the provider's statements to them. This document has been checked and approved by the  attending provider.

## 2023-04-07 ENCOUNTER — Encounter: Payer: Self-pay | Admitting: Radiation Oncology

## 2023-04-07 ENCOUNTER — Ambulatory Visit
Admission: RE | Admit: 2023-04-07 | Discharge: 2023-04-07 | Disposition: A | Discharge status: Home / Self Care | Payer: HMO | Source: Ambulatory Visit | Attending: Radiation Oncology | Admitting: Radiation Oncology

## 2023-04-07 VITALS — BP 155/81 | HR 65 | Temp 97.6°F | Resp 18 | Ht 64.5 in | Wt 161.8 lb

## 2023-04-07 DIAGNOSIS — C50411 Malignant neoplasm of upper-outer quadrant of right female breast: Secondary | ICD-10-CM | POA: Diagnosis not present

## 2023-04-07 DIAGNOSIS — Z7982 Long term (current) use of aspirin: Secondary | ICD-10-CM | POA: Insufficient documentation

## 2023-04-07 DIAGNOSIS — Z7901 Long term (current) use of anticoagulants: Secondary | ICD-10-CM | POA: Diagnosis not present

## 2023-04-07 DIAGNOSIS — Z79810 Long term (current) use of selective estrogen receptor modulators (SERMs): Secondary | ICD-10-CM | POA: Insufficient documentation

## 2023-04-07 DIAGNOSIS — Z79899 Other long term (current) drug therapy: Secondary | ICD-10-CM | POA: Diagnosis not present

## 2023-04-07 DIAGNOSIS — Z170 Estrogen receptor positive status [ER+]: Secondary | ICD-10-CM | POA: Diagnosis not present

## 2023-04-07 DIAGNOSIS — C50412 Malignant neoplasm of upper-outer quadrant of left female breast: Secondary | ICD-10-CM | POA: Diagnosis not present

## 2023-04-07 HISTORY — DX: Personal history of irradiation: Z92.3

## 2023-04-07 NOTE — Progress Notes (Signed)
 Sharon Rose is here today for follow up post radiation to the breast.   Breast Side:Left.    They completed their radiation on: 03/03/2023  Does the patient complain of any of the following: Post radiation skin issues: Denies Breast Tenderness: Denies Breast Swelling: Denies Lymphadema: Denies Range of Motion limitations: Denies Fatigue post radiation: Denies Appetite good/fair/poor: Denies   BP (!) 155/81 (BP Location: Left Arm, Patient Position: Sitting, Cuff Size: Normal)   Pulse 65   Temp 97.6 F (36.4 C)   Resp 18   Ht 5' 4.5" (1.638 m)   Wt 161 lb 12.8 oz (73.4 kg)   SpO2 98%   BMI 27.34 kg/m

## 2023-04-08 ENCOUNTER — Telehealth: Payer: Self-pay | Admitting: Cardiovascular Disease

## 2023-04-08 ENCOUNTER — Other Ambulatory Visit: Payer: Self-pay | Admitting: Cardiovascular Disease

## 2023-04-08 NOTE — Telephone Encounter (Signed)
*  STAT* If patient is at the pharmacy, call can be transferred to refill team.   1. Which medications need to be refilled? (please list name of each medication and dose if known) Losartan   2. Would you like to learn more about the convenience, safety, & potential cost savings by using the [facility]?    3. Are you open to using the [facility] (Type [facility]. .   4. Which pharmacy/location (including street and city if local pharmacy) is medication to be sent to?  [facility] [address]   5. Do they need a 30 day or 90 day supply? 90 days and refills  90 days and refills- please call in today- out of medicine

## 2023-04-10 ENCOUNTER — Encounter: Payer: Self-pay | Admitting: Cardiovascular Disease

## 2023-04-11 ENCOUNTER — Ambulatory Visit: Attending: Cardiovascular Disease | Admitting: *Deleted

## 2023-04-11 DIAGNOSIS — Z952 Presence of prosthetic heart valve: Secondary | ICD-10-CM | POA: Diagnosis not present

## 2023-04-11 DIAGNOSIS — Z7901 Long term (current) use of anticoagulants: Secondary | ICD-10-CM | POA: Diagnosis not present

## 2023-04-11 LAB — POCT INR: INR: 1.8 — AB (ref 2.0–3.0)

## 2023-04-11 NOTE — Patient Instructions (Signed)
 Description   Today take 1 tablet of warfarin then START taking warfarin 1 tablet daily except 1/2 tablet on Fridays.  Stay consistent with greens each week.  Repeat INR in 2 weeks.     Coumadin Clinic [phone] * tamoxifen 20 mg - starting on 03/26/23 (CHECK IN EVERY 2 WEEKS)*

## 2023-04-19 DIAGNOSIS — C50412 Malignant neoplasm of upper-outer quadrant of left female breast: Secondary | ICD-10-CM | POA: Diagnosis not present

## 2023-04-19 DIAGNOSIS — C50411 Malignant neoplasm of upper-outer quadrant of right female breast: Secondary | ICD-10-CM | POA: Diagnosis not present

## 2023-04-27 ENCOUNTER — Ambulatory Visit: Attending: Cardiovascular Disease

## 2023-04-27 DIAGNOSIS — Z952 Presence of prosthetic heart valve: Secondary | ICD-10-CM

## 2023-04-27 LAB — POCT INR: INR: 1.9 — AB (ref 2.0–3.0)

## 2023-04-27 NOTE — Patient Instructions (Signed)
 Description   Today take 1.5 tablet of warfarin then continue taking warfarin 1 tablet daily except 1/2 tablet on Fridays.  Stay consistent with greens each week.  Repeat INR in 2 weeks.     Coumadin Clinic [phone] * tamoxifen 20 mg - starting on 03/26/23 (CHECK IN EVERY 2 WEEKS)*

## 2023-05-11 ENCOUNTER — Ambulatory Visit: Attending: Cardiovascular Disease | Admitting: *Deleted

## 2023-05-11 DIAGNOSIS — Z7901 Long term (current) use of anticoagulants: Secondary | ICD-10-CM

## 2023-05-11 DIAGNOSIS — Z952 Presence of prosthetic heart valve: Secondary | ICD-10-CM

## 2023-05-11 LAB — POCT INR: INR: 2.5 (ref 2.0–3.0)

## 2023-05-11 NOTE — Patient Instructions (Addendum)
 Description   Continue taking warfarin 1 tablet daily except 1/2 tablet on Fridays.  Stay consistent with greens each week.  Repeat INR in 2 weeks.     Coumadin Clinic [phone] * tamoxifen 20 mg - starting on 03/26/23 (CHECK IN EVERY 2 WEEKS)*

## 2023-05-18 ENCOUNTER — Other Ambulatory Visit: Payer: Self-pay | Admitting: Cardiovascular Disease

## 2023-05-21 ENCOUNTER — Other Ambulatory Visit: Payer: Self-pay | Admitting: Cardiovascular Disease

## 2023-05-21 DIAGNOSIS — Z7901 Long term (current) use of anticoagulants: Secondary | ICD-10-CM

## 2023-05-23 NOTE — Telephone Encounter (Signed)
 Refill request for warfarin:  Last INR was 2.5 on 05/11/23 Next INR due 05/26/23 LOV was 04/05/23  Refill approved.

## 2023-05-26 ENCOUNTER — Ambulatory Visit: Attending: Cardiovascular Disease

## 2023-05-26 DIAGNOSIS — Z952 Presence of prosthetic heart valve: Secondary | ICD-10-CM | POA: Diagnosis not present

## 2023-05-26 LAB — POCT INR: INR: 1.7 — AB (ref 2.0–3.0)

## 2023-05-26 NOTE — Patient Instructions (Signed)
 Description   Take 1.5 tablets today and then continue taking warfarin 1 tablet daily except 1/2 tablet on Fridays.  Stay consistent with greens each week.  Repeat INR in 2 weeks.     Coumadin  Clinic [phone] * tamoxifen  20 mg - starting on 03/26/23 (CHECK IN EVERY 2 WEEKS)*

## 2023-05-31 ENCOUNTER — Inpatient Hospital Stay: Payer: HMO | Attending: Radiation Oncology | Admitting: Nurse Practitioner

## 2023-05-31 ENCOUNTER — Encounter: Payer: Self-pay | Admitting: Nurse Practitioner

## 2023-05-31 VITALS — BP 132/76 | HR 63 | Temp 97.6°F | Resp 17 | Ht 64.5 in | Wt 166.2 lb

## 2023-05-31 DIAGNOSIS — I252 Old myocardial infarction: Secondary | ICD-10-CM | POA: Diagnosis not present

## 2023-05-31 DIAGNOSIS — Z79899 Other long term (current) drug therapy: Secondary | ICD-10-CM | POA: Diagnosis not present

## 2023-05-31 DIAGNOSIS — Z8489 Family history of other specified conditions: Secondary | ICD-10-CM | POA: Diagnosis not present

## 2023-05-31 DIAGNOSIS — C50412 Malignant neoplasm of upper-outer quadrant of left female breast: Secondary | ICD-10-CM | POA: Diagnosis not present

## 2023-05-31 DIAGNOSIS — Z90710 Acquired absence of both cervix and uterus: Secondary | ICD-10-CM | POA: Diagnosis not present

## 2023-05-31 DIAGNOSIS — Z888 Allergy status to other drugs, medicaments and biological substances status: Secondary | ICD-10-CM | POA: Diagnosis not present

## 2023-05-31 DIAGNOSIS — Z1732 Human epidermal growth factor receptor 2 negative status: Secondary | ICD-10-CM | POA: Diagnosis not present

## 2023-05-31 DIAGNOSIS — Z7901 Long term (current) use of anticoagulants: Secondary | ICD-10-CM | POA: Insufficient documentation

## 2023-05-31 DIAGNOSIS — I10 Essential (primary) hypertension: Secondary | ICD-10-CM | POA: Diagnosis not present

## 2023-05-31 DIAGNOSIS — I2510 Atherosclerotic heart disease of native coronary artery without angina pectoris: Secondary | ICD-10-CM | POA: Diagnosis not present

## 2023-05-31 DIAGNOSIS — Z1721 Progesterone receptor positive status: Secondary | ICD-10-CM | POA: Insufficient documentation

## 2023-05-31 DIAGNOSIS — C50811 Malignant neoplasm of overlapping sites of right female breast: Secondary | ICD-10-CM | POA: Diagnosis not present

## 2023-05-31 DIAGNOSIS — Z79810 Long term (current) use of selective estrogen receptor modulators (SERMs): Secondary | ICD-10-CM | POA: Insufficient documentation

## 2023-05-31 DIAGNOSIS — C50411 Malignant neoplasm of upper-outer quadrant of right female breast: Secondary | ICD-10-CM | POA: Insufficient documentation

## 2023-05-31 DIAGNOSIS — Z170 Estrogen receptor positive status [ER+]: Secondary | ICD-10-CM | POA: Diagnosis not present

## 2023-05-31 DIAGNOSIS — Z882 Allergy status to sulfonamides status: Secondary | ICD-10-CM | POA: Diagnosis not present

## 2023-05-31 DIAGNOSIS — Z885 Allergy status to narcotic agent status: Secondary | ICD-10-CM | POA: Diagnosis not present

## 2023-05-31 NOTE — Progress Notes (Signed)
 CLINIC:  Survivorship   REASON FOR VISIT:  Routine follow-up post-treatment for a recent history of breast cancer.  BRIEF ONCOLOGIC HISTORY:  Oncology History  Malignant neoplasm of upper-outer quadrant of left breast in female, estrogen receptor positive (HCC)  11/05/2022 Cancer Staging   Staging form: Breast, AJCC 8th Edition - Clinical stage from 11/05/2022: Stage IA (cT1c, cN0, cM0, G2, ER+, PR+, HER2-) - Signed by Sonja Irving, MD on 11/17/2022 Stage prefix: Initial diagnosis Histologic grading system: 3 grade system   11/15/2022 Initial Diagnosis   Malignant neoplasm of upper-outer quadrant of left breast in female, estrogen receptor positive (HCC)   12/03/2022 Genetic Testing   Negative [facility] CancerNext+RNAinsight Panel.  Report date is 12/03/2022.   The [facility] CancerNext+RNAinsight Panel includes sequencing, rearrangement analysis, and RNA analysis for the following 34 genes: APC, ATM, BARD1, BMPR1A, BRCA1, BRCA2, BRIP1, CDH1, CDK4, CDKN2A, CHEK2, DICER1, MLH1, MSH2, MSH6, MUTYH, NF1, NTHL1, PALB2, PMS2, PTEN, RAD51C, RAD51D, SMAD4, SMARCA4, STK11 and TP53 (sequencing and deletion/duplication); AXIN2, HOXB13, MSH3, POLD1 and POLE (sequencing only); EPCAM and GREM1 (deletion/duplication only).   12/29/2022 Cancer Staging   Staging form: Breast, AJCC 8th Edition - Pathologic stage from 12/29/2022: Stage IA (pT2, pN0, cM0, G2, ER+, PR+, HER2-) - Signed by Sonja Prichard, MD on 01/28/2023 Histologic grading system: 3 grade system Residual tumor (R): R0 - None   Malignant neoplasm of upper-outer quadrant of right breast in female, estrogen receptor positive (HCC)  11/05/2022 Cancer Staging   Staging form: Breast, AJCC 8th Edition - Clinical stage from 11/05/2022: Stage IA (cT1b, cN0, cM0, G1, ER+, PR+, HER2-) - Signed by Sonja [place], MD on 11/17/2022 Stage prefix: Initial diagnosis Histologic grading system: 3 grade system   11/15/2022 Initial Diagnosis   Malignant neoplasm of upper-outer  quadrant of right breast in female, estrogen receptor positive (HCC)   12/03/2022 Genetic Testing   Negative [facility] CancerNext+RNAinsight Panel.  Report date is 12/03/2022.   The [employer] CancerNext+RNAinsight Panel includes sequencing, rearrangement analysis, and RNA analysis for the following 34 genes: APC, ATM, BARD1, BMPR1A, BRCA1, BRCA2, BRIP1, CDH1, CDK4, CDKN2A, CHEK2, DICER1, MLH1, MSH2, MSH6, MUTYH, NF1, NTHL1, PALB2, PMS2, PTEN, RAD51C, RAD51D, SMAD4, SMARCA4, STK11 and TP53 (sequencing and deletion/duplication); AXIN2, HOXB13, MSH3, POLD1 and POLE (sequencing only); EPCAM and GREM1 (deletion/duplication only).   12/29/2022 Cancer Staging   Staging form: Breast, AJCC 8th Edition - Pathologic stage from 12/29/2022: Stage IA (pT1a, pN0, cM0, G1, ER+, PR+, HER2-) - Signed by Sonja [place], MD on 01/28/2023 Histologic grading system: 3 grade system Residual tumor (R): R0 - None     INTERVAL HISTORY:  Ms. Abramyan presents to the Survivorship Clinic today for our initial meeting to review her survivorship care plan detailing her treatment course for breast cancer, as well as monitoring long-term side effects of that treatment, education regarding health maintenance, screening, and overall wellness and health promotion.     Overall, Ms. Friske reports feeling quite well since completing her radiation therapy.  Skin recovered nicely.  Has occasional breast pains which she was told to expect.  She began tamoxifen , tolerating well with no significant side effects.  Coumadin  is being monitored closely.    REVIEW OF SYSTEMS:  Review of Systems - Oncology Breast: Denies any new nodularity, masses, tenderness, nipple changes, or nipple discharge.      ONCOLOGY TREATMENT TEAM:  1. Surgeon:  Dr. Lucienne Ryder at [facility] 2. Medical Oncologist: Dr. Maryalice Smaller  3. Radiation Oncologist: Dr. Eloise Hake    PAST MEDICAL/SURGICAL HISTORY:  Past Medical History:  Diagnosis Date   Age-related memory  disorder 12/20/2022   pt reports   Breast cancer (HCC)    CAD (coronary artery disease)    NUCLEAR STRESS TEST, 10/07/2008 - no ischemia   Dyspnea on exertion    H/O aortic valve replacement    2D ECHO, 04/26/2011 - EF 50-55%, normal-mild   History of radiation therapy    Left breast-02/02/23-03/03/23- Dr. Retta Caster   Hypertension    NSTEMI (non-ST elevated myocardial infarction) (HCC) 12/2004   Secondary to occlusion of OM 2, small vessel and medical therapy recommended; cath 2011 with no CAD   Past Surgical History:  Procedure Laterality Date   ABDOMINAL HYSTERECTOMY     AORTIC VALVE REPLACEMENT  09/12/2004   [employer] Jude mechanical aortic valve, model #23AK-501, serial #[id number]   BREAST LUMPECTOMY WITH RADIOACTIVE SEED LOCALIZATION Bilateral 12/29/2022   Procedure: BILATERAL RADIOACTIVE SEED GUIDED LUMPECTOMIES;  Surgeon: Oza Blumenthal, MD;  Location: [facility];  Service: General;  Laterality: Bilateral;   CARDIAC CATHETERIZATION Left 03/25/2009   Normal cors   CARDIAC CATHETERIZATION  01/20/2005   Medical management for occluded and possibly revascularized OM 2, string sign seen   CARDIAC CATHETERIZATION  08/11/2004   Elective aortic valve replacement, severe aortic valvre stenosis with functional class II to III symptomatology     ALLERGIES:  Allergies  Allergen Reactions   Amlodipine  Besylate Swelling   Clindamycin Hcl     Other Reaction(s): diarrhea   Codeine Nausea And Vomiting and Other (See Comments)    GI upset, dizziness   Gabapentin     Other Reaction(s): depression and muscle pain   Hydrocodone     Other Reaction(s): nausea and vomiting   Nitroglycerin      Other Reaction(s): Low BP   Statins Other (See Comments)    Joint and muscle pain   Sulfa Antibiotics Nausea And Vomiting and Other (See Comments)    Gi upset, dizziness    Verapamil Other (See Comments)    Constipation   Diclofenac Sodium Rash     CURRENT MEDICATIONS:   Outpatient Encounter Medications as of 05/31/2023  Medication Sig   aspirin  EC 81 MG tablet Take 81 mg by mouth daily.   calcium  carbonate (OS-CAL) 600 MG TABS Take 600 mg by mouth 2 (two) times daily with a meal.   losartan  (COZAAR ) 100 MG tablet TAKE 1 TABLET BY MOUTH EVERY DAY (STOP TAKING THE LOSARTAN -HCTZ COMBO TAB)   metoprolol  succinate (TOPROL -XL) 25 MG 24 hr tablet TAKE 1/2 TABLET BY MOUTH DAILY   Multiple Vitamin (MULTIVITAMIN) tablet Take 1 tablet by mouth daily.   omeprazole (PRILOSEC) 20 MG capsule Take 20 mg by mouth daily.   rosuvastatin  (CRESTOR ) 5 MG tablet Take 5 mg by mouth every evening.    tamoxifen  (NOLVADEX ) 20 MG tablet Take 1 tablet (20 mg total) by mouth daily.   traMADol  (ULTRAM ) 50 MG tablet Take 1 tablet (50 mg total) by mouth every 6 (six) hours as needed for moderate pain (pain score 4-6) or severe pain (pain score 7-10).   warfarin (COUMADIN ) 5 MG tablet Take 5 mg by mouth.   warfarin (COUMADIN ) 5 MG tablet TAKE 1/2 TABLET TO 1 TABLET BY MOUTH DAILY AS DIRECTED BY COUMADIN  CLINIC   No facility-administered encounter medications on file as of 05/31/2023.     ONCOLOGIC FAMILY HISTORY:  Family History  Problem Relation Age of Onset   Fibrocystic breast disease Maternal Aunt      GENETIC  COUNSELING/TESTING: N/A  SOCIAL HISTORY:  ONIA THACKER is here with her son. She denies any current or history of tobacco, alcohol, or illicit drug use.     PHYSICAL EXAMINATION:  Vital Signs:   Vitals:   05/31/23 1135  BP: 132/76  Pulse: 63  Resp: 17  Temp: 97.6 F (36.4 C)  SpO2: 95%   Filed Weights   05/31/23 1135  Weight: 166 lb 3.2 oz (75.4 kg)   General: Well-nourished, well-appearing female in no acute distress.  HEENT: Sclerae anicteric.  Respiratory: breathing non-labored.  Neuro: No focal deficits. Steady gait.  Psych: Mood and affect normal and appropriate for situation.  MSK: Full range of motion in bilateral upper extremities Breast:  deferred  LABORATORY DATA:  None for this visit.  DIAGNOSTIC IMAGING:  None for this visit.      ASSESSMENT AND PLAN:  Ms.. Flannelly is a pleasant 83 y.o. female with bilateral breast cancers (ILC in left breast and IDC in right breast) ER+/PR+/HER2-, diagnosed in 10/2022, treated with lumpectomy, adjuvant radiation therapy, and anti-estrogen therapy with tamoxifen  beginning in 03/2023. She presents to the Survivorship Clinic for our initial meeting and routine follow-up post-completion of treatment for breast cancer.    Stage I bilateral breast cancers:  Ms. Langbehn has recovered well from definitive treatment for breast cancer. She will follow-up with her medical oncologist, Dr. Maryalice Smaller in 08/2023 with history and physical exam per surveillance protocol.  She will continue her anti-estrogen therapy with tamoxifen . Thus far, she is tolerating well, with minimal side effects. She was instructed to make Dr. Maryalice Smaller or myself aware if she begins to experience any worsening side effects of the medication and I could see her back in clinic to help manage those side effects, as needed.Today, a comprehensive survivorship care plan and treatment summary was reviewed with the patient today detailing her breast cancer diagnosis, treatment course, potential late/long-term effects of treatment, appropriate follow-up care with recommendations for the future, and patient education resources.  A copy of this summary, along with a letter will be sent to the patient's primary care provider via mail/fax/In Basket message after today's visit.    Bone health: Reviewed the bone strengthening quality of tamoxifen  and encouraged to continue calcium , vitamin D , and weightbearing exercise.  Cancer screening:  Due to Ms. Yasuda's age, she is aged out of colonoscopy and lung cancer screening.  She is status post hysterectomy. The information and recommendations are listed on the patient's comprehensive care plan/treatment summary and  were reviewed in detail with the patient.    Health maintenance and wellness promotion: Ms. Culliver was encouraged to consume 5-7 servings of fruits and vegetables per day.  She was also encouraged to engage in moderate exercise most days of the week.  She was instructed to limit her alcohol consumption and continue to abstain from tobacco use.     Support services/counseling: It is not uncommon for this period of the patient's cancer care trajectory to be one of many emotions and stressors.  We discussed an opportunity for her to participate in the next session of FYNN ("Finding Your New Normal") support group series designed for patients after they have completed treatment.   Ms. Armbrecht was encouraged to take advantage of our many other support services programs, support groups, and/or counseling in coping with her new life as a cancer survivor after completing anti-cancer treatment.  She was offered support today through active listening and expressive supportive counseling.  She was given information regarding  our available services and encouraged to contact me with any questions or for help enrolling in any of our support group/programs.    Dispo:   -Return to cancer center in 3 months as scheduled  -Mammogram due in September, already scheduled at [facility] per pt -Follow up with surgery as indicated -She is welcome to return back to the Survivorship Clinic at any time; no additional follow-up needed at this time.  -Consider referral back to survivorship as a long-term survivor for continued surveillance  A total of (30) minutes of face-to-face time was spent with this patient with greater than 50% of that time in counseling and care-coordination.   Rahmir Beever, NP Survivorship Program [facility] [phone]   Note: PRIMARY CARE PROVIDER Elida Grounds, [place] [phone]

## 2023-06-09 ENCOUNTER — Ambulatory Visit: Attending: Cardiovascular Disease | Admitting: *Deleted

## 2023-06-09 DIAGNOSIS — Z5181 Encounter for therapeutic drug level monitoring: Secondary | ICD-10-CM

## 2023-06-09 DIAGNOSIS — Z952 Presence of prosthetic heart valve: Secondary | ICD-10-CM

## 2023-06-09 LAB — POCT INR: POC INR: 1.9

## 2023-06-09 NOTE — Patient Instructions (Signed)
 Description   START taking warfarin 1 tablet daily.  Stay consistent with greens each week.  Repeat INR in 2 weeks.     Coumadin  Clinic [phone] * tamoxifen  20 mg - starting on 03/26/23 (CHECK IN EVERY 2 WEEKS)*

## 2023-06-30 ENCOUNTER — Ambulatory Visit: Attending: Cardiovascular Disease

## 2023-06-30 DIAGNOSIS — Z952 Presence of prosthetic heart valve: Secondary | ICD-10-CM

## 2023-06-30 LAB — POCT INR: INR: 2.1 (ref 2.0–3.0)

## 2023-06-30 NOTE — Patient Instructions (Signed)
 Description   Continue taking warfarin 1 tablet daily.  Stay consistent with greens each week.  Repeat INR in 3 weeks.     Coumadin  Clinic [phone] * tamoxifen  20 mg - starting on 03/26/23 (CHECK IN EVERY 2 WEEKS)*

## 2023-07-01 ENCOUNTER — Other Ambulatory Visit: Payer: Self-pay | Admitting: Hematology

## 2023-07-06 NOTE — Progress Notes (Signed)
 Cardiology Office Note:    Date:  07/07/2023   ID:  Sharon Rose, DOB [birth year], MRN [id number]  PCP:  Elida Grounds, DO   [facility] Providers Cardiologist:  Luana Rumple, MD Electrophysiologist:  Efraim Grange, MD     Referring MD: Elida Grounds, DO   Chief complaint: shortness of breath on exertion  History of Present Illness:    Sharon Rose is a 83 y.o. female with a hx of mechanical aortic valve, HTN, HLD referred for arrhythmia management.  Her primary complaint is shortness of breath with exertion. This occurs primarily when she is walking a hilly route. 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  She reports that she has had palpitations since her aortic valve surgery. A monitor was placed that showed frequent PACs and atrial runs. Second degree AV block was also reported, but I'm not 100% convinced that she is having atrial runs with a pause at the end of the run prior to a sinus or another ectopic atrial beat. She notes that she has had syncope in the distant past.  12.12.2023 Since her last visit, she has made some changes to manage symptoms from hiatal hernia -- small meals, eating slowly. Her shortness of breath has improved. She performed an exercise tolerance test, achieving > 80% MPHR. She remains free of palpitations pre-syncope, syncope.      EKGs/Labs/Other Studies Reviewed:    Exercise tolerance test November 05, 2021: Achieved max heart rate 127 bpm. (90% MPHR)   TTE: 03/03/2023 LVEF 55%.  Grade 1 diastolic dysfunction.  Mildly dilated left atrium aortic valve replacement with mild regurgitation.  Zio patch 10/06/2021   Predominant rhythm is normal sinus rhythm with normal circadian variation.   Rare and very brief episodes of nonsustained ventricular tachycardia are seen.   There are very frequent premature atrial contractions (roughly  20% of all beats) and there are very frequent but brief episodes of nonsustained supraventricular tachycardia (maximum 19 beats, roughly 10 seconds in duration).  The episodes of supraventricular tachycardia correlate well with the patient's symptoms.   There is no evidence of atrial fibrillation   Second-degree atrioventricular block Mobitz type I is frequently seen, both during sinus rhythm and during atrial tachycardia.  She states that  EKG:  Last EKG results: Frequent atrial ectopy, occurring in runs. Possibly a blocked PAC   Recent Labs: 03/01/2023: ALT 13; BUN 18; Creatinine 0.76; Hemoglobin 12.4; Platelet Count 255; Potassium 3.5; Sodium 141     Physical Exam:    VS:  BP (!) 162/80   Pulse 64   Ht 5' 5 (1.651 m)   Wt 167 lb 6.4 oz (75.9 kg)   SpO2 99%   BMI 27.86 kg/m     Wt Readings from Last 3 Encounters:  07/07/23 167 lb 6.4 oz (75.9 kg)  05/31/23 166 lb 3.2 oz (75.4 kg)  04/07/23 161 lb 12.8 oz (73.4 kg)     GEN: Well nourished, well developed in no acute distress CARDIAC: Irregular rhythm, no murmurs, rubs, gallops RESPIRATORY:  Normal work of breathing MUSCULOSKELETAL: trace edema    ASSESSMENT & PLAN:    Exertional dyspnea: Not sure this is related to rhythm. Stress tolerance test shows that she has adequate chronotropic competence.   Frequent atrial ectopy: does not appear to be terribly symptomatic for her. Does not appear to be responsive to betablocker.  AV  block: First degree AV block, frequently blocked PACs, likely also second degree type I block on occasion. I would not consider a class I antiarrhythmic for her without a pacemaker.  Mechanical aortic valve    Follow-up 1 year     Medication Adjustments/Labs and Tests Ordered: Current medicines are reviewed at length with the patient today.  Concerns regarding medicines are outlined above.  Orders Placed This Encounter  Procedures   EKG 12-Lead   No orders of the defined types were placed in  this encounter.    Signed, Efraim Grange, MD  07/07/2023 9:12 AM    [facility]

## 2023-07-07 ENCOUNTER — Encounter: Payer: Self-pay | Admitting: Cardiovascular Disease

## 2023-07-07 ENCOUNTER — Ambulatory Visit: Attending: Cardiovascular Disease | Admitting: Cardiovascular Disease

## 2023-07-07 VITALS — BP 162/80 | HR 64 | Ht 65.0 in | Wt 167.4 lb

## 2023-07-07 DIAGNOSIS — I440 Atrioventricular block, first degree: Secondary | ICD-10-CM

## 2023-07-07 NOTE — Patient Instructions (Signed)

## 2023-07-15 DIAGNOSIS — I872 Venous insufficiency (chronic) (peripheral): Secondary | ICD-10-CM | POA: Diagnosis not present

## 2023-07-15 DIAGNOSIS — L821 Other seborrheic keratosis: Secondary | ICD-10-CM | POA: Diagnosis not present

## 2023-07-15 DIAGNOSIS — L589 Radiodermatitis, unspecified: Secondary | ICD-10-CM | POA: Diagnosis not present

## 2023-07-15 DIAGNOSIS — D225 Melanocytic nevi of trunk: Secondary | ICD-10-CM | POA: Diagnosis not present

## 2023-07-15 DIAGNOSIS — L814 Other melanin hyperpigmentation: Secondary | ICD-10-CM | POA: Diagnosis not present

## 2023-07-15 DIAGNOSIS — L720 Epidermal cyst: Secondary | ICD-10-CM | POA: Diagnosis not present

## 2023-07-15 DIAGNOSIS — R600 Localized edema: Secondary | ICD-10-CM | POA: Diagnosis not present

## 2023-07-21 ENCOUNTER — Ambulatory Visit: Attending: Cardiovascular Disease

## 2023-07-21 DIAGNOSIS — Z952 Presence of prosthetic heart valve: Secondary | ICD-10-CM | POA: Diagnosis not present

## 2023-07-21 LAB — POCT INR: INR: 3.5 — AB (ref 2.0–3.0)

## 2023-07-21 NOTE — Progress Notes (Signed)
Please see anticoagulation encounter.

## 2023-07-21 NOTE — Patient Instructions (Signed)
 Description   HOLD today's dose and then continue taking warfarin 1 tablet daily.  Stay consistent with greens each week.  Repeat INR in 3 weeks.     Coumadin  Clinic [phone] * tamoxifen  20 mg - starting on 03/26/23 (CHECK IN EVERY 2 WEEKS)*

## 2023-08-11 ENCOUNTER — Ambulatory Visit: Attending: Cardiovascular Disease | Admitting: *Deleted

## 2023-08-11 DIAGNOSIS — Z952 Presence of prosthetic heart valve: Secondary | ICD-10-CM

## 2023-08-11 LAB — POCT INR: POC INR: 2.4

## 2023-08-11 NOTE — Progress Notes (Signed)
Please see anticoagulation encounter.

## 2023-08-11 NOTE — Patient Instructions (Addendum)
 Description   Continue taking warfarin 1 tablet daily.  Stay consistent with greens each week.  Repeat INR in 4 weeks.     Coumadin  Clinic [phone] * tamoxifen  20 mg - starting on 03/26/23

## 2023-08-25 ENCOUNTER — Telehealth: Payer: Self-pay | Admitting: Cardiovascular Disease

## 2023-08-25 DIAGNOSIS — M545 Low back pain, unspecified: Secondary | ICD-10-CM | POA: Diagnosis not present

## 2023-08-25 NOTE — Telephone Encounter (Signed)
 Spoke with patient and shared message from Pharm D:  Yes, ok to take. May alter her INR so recommend Coumadin  clinic appt next week. Will route to anticoag pool to contact.      Patient will need appt with Coumadin  Clinic scheduled for next week to check INR.  Patient verbalized understanding.

## 2023-08-25 NOTE — Telephone Encounter (Signed)
 Yes, ok to take. May alter her INR so recommend Coumadin  clinic appt next week. Will route to anticoag pool to contact.

## 2023-08-25 NOTE — Telephone Encounter (Signed)
 Pt c/o medication issue:  1. Name of Medication:   Prednisone  2. How are you currently taking this medication (dosage and times per day)?   Not started yet  3. Are you having a reaction (difficulty breathing--STAT)?   4. What is your medication issue?   Patient stated she had a bad fall and injured her back and is being put on Prednisone.  Patient noted she takes Coumadin  and wants to know if she should start on this medication.

## 2023-08-26 NOTE — Telephone Encounter (Signed)
 Spoke with patient and she states she has injured her back a week ago and started a Prednisone 5mg  taper 6 tabs, 5 tabs,4 tabs, 3 tabs, 2 tabs, 1 tabs x 6 days then complete.  Advised since prednisone interacts with warfarin then she will need to take warfarin 1/2 tablet (2.5mg ) Saturday, Sunday and Monday then report to have INR checked on 08/29/23. She verbalized understanding and repeated instructions and confirmed appointment.

## 2023-08-29 ENCOUNTER — Ambulatory Visit: Attending: Cardiovascular Disease | Admitting: *Deleted

## 2023-08-29 DIAGNOSIS — Z7901 Long term (current) use of anticoagulants: Secondary | ICD-10-CM

## 2023-08-29 DIAGNOSIS — Z952 Presence of prosthetic heart valve: Secondary | ICD-10-CM

## 2023-08-29 LAB — POCT INR: INR: 2 (ref 2.0–3.0)

## 2023-08-29 NOTE — Patient Instructions (Signed)
 Description   Continue taking warfarin 1 tablet daily.  Stay consistent with greens each week.  Repeat INR in 4 weeks.     Coumadin  Clinic [phone] * tamoxifen  20 mg - starting on 03/26/23 - monitor INR closely. ( See telephone note from 03/02/2023)

## 2023-08-29 NOTE — Progress Notes (Signed)
 INR 2.0 Please see anticoagulation encounter.

## 2023-08-30 DIAGNOSIS — M533 Sacrococcygeal disorders, not elsewhere classified: Secondary | ICD-10-CM | POA: Diagnosis not present

## 2023-09-07 ENCOUNTER — Encounter: Payer: Self-pay | Admitting: Hematology

## 2023-09-07 ENCOUNTER — Inpatient Hospital Stay: Payer: HMO | Admitting: Hematology

## 2023-09-07 ENCOUNTER — Inpatient Hospital Stay: Payer: HMO | Attending: Radiation Oncology

## 2023-09-07 VITALS — BP 142/72 | HR 72 | Temp 98.0°F | Resp 14 | Ht 65.0 in | Wt 168.6 lb

## 2023-09-07 DIAGNOSIS — Z17411 Hormone receptor positive with human epidermal growth factor receptor 2 negative status: Secondary | ICD-10-CM | POA: Insufficient documentation

## 2023-09-07 DIAGNOSIS — Z79810 Long term (current) use of selective estrogen receptor modulators (SERMs): Secondary | ICD-10-CM | POA: Insufficient documentation

## 2023-09-07 DIAGNOSIS — Z888 Allergy status to other drugs, medicaments and biological substances status: Secondary | ICD-10-CM | POA: Insufficient documentation

## 2023-09-07 DIAGNOSIS — I252 Old myocardial infarction: Secondary | ICD-10-CM | POA: Insufficient documentation

## 2023-09-07 DIAGNOSIS — I10 Essential (primary) hypertension: Secondary | ICD-10-CM | POA: Insufficient documentation

## 2023-09-07 DIAGNOSIS — Z7982 Long term (current) use of aspirin: Secondary | ICD-10-CM | POA: Insufficient documentation

## 2023-09-07 DIAGNOSIS — G3184 Mild cognitive impairment, so stated: Secondary | ICD-10-CM | POA: Insufficient documentation

## 2023-09-07 DIAGNOSIS — Z7901 Long term (current) use of anticoagulants: Secondary | ICD-10-CM | POA: Insufficient documentation

## 2023-09-07 DIAGNOSIS — C50811 Malignant neoplasm of overlapping sites of right female breast: Secondary | ICD-10-CM

## 2023-09-07 DIAGNOSIS — C50412 Malignant neoplasm of upper-outer quadrant of left female breast: Secondary | ICD-10-CM

## 2023-09-07 DIAGNOSIS — Z882 Allergy status to sulfonamides status: Secondary | ICD-10-CM | POA: Diagnosis not present

## 2023-09-07 DIAGNOSIS — Z79899 Other long term (current) drug therapy: Secondary | ICD-10-CM | POA: Diagnosis not present

## 2023-09-07 DIAGNOSIS — D649 Anemia, unspecified: Secondary | ICD-10-CM | POA: Insufficient documentation

## 2023-09-07 DIAGNOSIS — R232 Flushing: Secondary | ICD-10-CM | POA: Insufficient documentation

## 2023-09-07 DIAGNOSIS — Z170 Estrogen receptor positive status [ER+]: Secondary | ICD-10-CM | POA: Diagnosis not present

## 2023-09-07 DIAGNOSIS — Z885 Allergy status to narcotic agent status: Secondary | ICD-10-CM | POA: Insufficient documentation

## 2023-09-07 DIAGNOSIS — C50411 Malignant neoplasm of upper-outer quadrant of right female breast: Secondary | ICD-10-CM | POA: Diagnosis not present

## 2023-09-07 DIAGNOSIS — Z952 Presence of prosthetic heart valve: Secondary | ICD-10-CM | POA: Diagnosis not present

## 2023-09-07 DIAGNOSIS — Z90710 Acquired absence of both cervix and uterus: Secondary | ICD-10-CM | POA: Diagnosis not present

## 2023-09-07 LAB — CBC WITH DIFFERENTIAL (CANCER CENTER ONLY)
Abs Immature Granulocytes: 0.01 K/uL (ref 0.00–0.07)
Basophils Absolute: 0 K/uL (ref 0.0–0.1)
Basophils Relative: 1 %
Eosinophils Absolute: 0.1 K/uL (ref 0.0–0.5)
Eosinophils Relative: 2 %
HCT: 34.8 % — ABNORMAL LOW (ref 36.0–46.0)
Hemoglobin: 11.8 g/dL — ABNORMAL LOW (ref 12.0–15.0)
Immature Granulocytes: 0 %
Lymphocytes Relative: 18 %
Lymphs Abs: 0.8 K/uL (ref 0.7–4.0)
MCH: 32 pg (ref 26.0–34.0)
MCHC: 33.9 g/dL (ref 30.0–36.0)
MCV: 94.3 fL (ref 80.0–100.0)
Monocytes Absolute: 0.4 K/uL (ref 0.1–1.0)
Monocytes Relative: 8 %
Neutro Abs: 3.3 K/uL (ref 1.7–7.7)
Neutrophils Relative %: 71 %
Platelet Count: 216 K/uL (ref 150–400)
RBC: 3.69 MIL/uL — ABNORMAL LOW (ref 3.87–5.11)
RDW: 12.3 % (ref 11.5–15.5)
WBC Count: 4.6 K/uL (ref 4.0–10.5)
nRBC: 0 % (ref 0.0–0.2)

## 2023-09-07 LAB — CMP (CANCER CENTER ONLY)
ALT: 11 U/L (ref 0–44)
AST: 20 U/L (ref 15–41)
Albumin: 4.1 g/dL (ref 3.5–5.0)
Alkaline Phosphatase: 121 U/L (ref 38–126)
Anion gap: 3 — ABNORMAL LOW (ref 5–15)
BUN: 15 mg/dL (ref 8–23)
CO2: 32 mmol/L (ref 22–32)
Calcium: 8.8 mg/dL — ABNORMAL LOW (ref 8.9–10.3)
Chloride: 107 mmol/L (ref 98–111)
Creatinine: 0.78 mg/dL (ref 0.44–1.00)
GFR, Estimated: >60 mL/min (ref >60)
Glucose, Bld: 93 mg/dL (ref 70–99)
Potassium: 3.8 mmol/L (ref 3.5–5.1)
Sodium: 142 mmol/L (ref 135–145)
Total Bilirubin: 0.5 mg/dL (ref 0.0–1.2)
Total Protein: 6.6 g/dL (ref 6.5–8.1)

## 2023-09-07 NOTE — Progress Notes (Signed)
 [facility]   [phone]) [phone] [phone]   Clinic Follow up Note   Patient Care Team: Auston Opal, DO as PCP - General (Family Medicine) Croitoru, Jerel, MD as PCP - Cardiology (Cardiology) Mealor, Eulas BRAVO, MD as PCP - Electrophysiology (Cardiology) Vernetta Berg, MD as Consulting Physician (General Surgery) Lanny Callander, MD as Consulting Physician (Hematology) Shannon Agent, MD as Consulting Physician (Radiation Oncology) Glean Stephane BROCKS, RN (Inactive) as Registered Nurse Tyree Nanetta SAILOR, RN as Registered Nurse Burton, Lacie K, NP as Nurse Practitioner (Nurse Practitioner)  Date of Service:  09/07/2023  CHIEF COMPLAINT: f/u of breast cancer  CURRENT THERAPY:  Tamoxifen  20 mg daily  Oncology History   Malignant neoplasm of upper-outer quadrant of left breast in female, estrogen receptor positive (HCC) Invasive lobular carcinoma, pT2N0M0 stage IA, ER+/PR+/HER2-, (+) LCIS  -Diagosed in 10/2022, discovered by screening mammogram -Status post bilateral lumpectomy, which showed 2 invasive lobular carcinoma in left breast, 1 with focally positive posterior margin.  -Will complete adjuvant radiation therapy 02/2023 -She started adjuvant tamoxifen  in March 2025  Assessment & Plan Stage 1 breast cancer on tamoxifen  Stage 1 breast cancer, currently on tamoxifen  therapy. Tolerating tamoxifen  well with mild side effects. No new breast masses or significant abnormalities on examination. - Continue tamoxifen  for a total of five years. - Ensure mammogram is scheduled for September 2025.  Hot flashes secondary to tamoxifen  Experiencing hot flashes secondary to tamoxifen . Symptoms are present but not severe.  Mild anemia Mild anemia with hemoglobin at 11.8, slightly decreased from previous 12.4. Likely related to Coumadin  use, which can cause mild bleeding. No overt signs of bleeding reported. - Monitor for signs of bleeding, including stool and other  bleeding. - Continue regular Coumadin  checks to ensure therapeutic levels.  Memory impairment Reports memory impairment, with some difficulty in managing daily tasks. Son assists with banking and checks in daily. Lives independently but requires some support.  Plan - She is clinically doing well, tolerating tamoxifen  well with mild hot flashes, will continue - Lab and follow-up in 6 months   SUMMARY OF ONCOLOGIC HISTORY: Oncology History  Malignant neoplasm of upper-outer quadrant of left breast in female, estrogen receptor positive (HCC)  11/05/2022 Cancer Staging   Staging form: Breast, AJCC 8th Edition - Clinical stage from 11/05/2022: Stage IA (cT1c, cN0, cM0, G2, ER+, PR+, HER2-) - Signed by Lanny Callander, MD on 11/17/2022 Stage prefix: Initial diagnosis Histologic grading system: 3 grade system   11/15/2022 Initial Diagnosis   Malignant neoplasm of upper-outer quadrant of left breast in female, estrogen receptor positive (HCC)   12/03/2022 Genetic Testing   Negative [facility] CancerNext+RNAinsight Panel.  Report date is 12/03/2022.   The [employer] CancerNext+RNAinsight Panel includes sequencing, rearrangement analysis, and RNA analysis for the following 34 genes: APC, ATM, BARD1, BMPR1A, BRCA1, BRCA2, BRIP1, CDH1, CDK4, CDKN2A, CHEK2, DICER1, MLH1, MSH2, MSH6, MUTYH, NF1, NTHL1, PALB2, PMS2, PTEN, RAD51C, RAD51D, SMAD4, SMARCA4, STK11 and TP53 (sequencing and deletion/duplication); AXIN2, HOXB13, MSH3, POLD1 and POLE (sequencing only); EPCAM and GREM1 (deletion/duplication only).   12/29/2022 Cancer Staging   Staging form: Breast, AJCC 8th Edition - Pathologic stage from 12/29/2022: Stage IA (pT2, pN0, cM0, G2, ER+, PR+, HER2-) - Signed by Lanny Callander, MD on 01/28/2023 Histologic grading system: 3 grade system Residual tumor (R): R0 - None   Malignant neoplasm of upper-outer quadrant of right breast in female, estrogen receptor positive (HCC)  11/05/2022 Cancer Staging   Staging form: Breast,  AJCC 8th Edition - Clinical stage  from 11/05/2022: Stage IA (cT1b, cN0, cM0, G1, ER+, PR+, HER2-) - Signed by Lanny Callander, MD on 11/17/2022 Stage prefix: Initial diagnosis Histologic grading system: 3 grade system   11/15/2022 Initial Diagnosis   Malignant neoplasm of upper-outer quadrant of right breast in female, estrogen receptor positive (HCC)   12/03/2022 Genetic Testing   Negative [employer] CancerNext+RNAinsight Panel.  Report date is 12/03/2022.   The [employer] CancerNext+RNAinsight Panel includes sequencing, rearrangement analysis, and RNA analysis for the following 34 genes: APC, ATM, BARD1, BMPR1A, BRCA1, BRCA2, BRIP1, CDH1, CDK4, CDKN2A, CHEK2, DICER1, MLH1, MSH2, MSH6, MUTYH, NF1, NTHL1, PALB2, PMS2, PTEN, RAD51C, RAD51D, SMAD4, SMARCA4, STK11 and TP53 (sequencing and deletion/duplication); AXIN2, HOXB13, MSH3, POLD1 and POLE (sequencing only); EPCAM and GREM1 (deletion/duplication only).   12/29/2022 Cancer Staging   Staging form: Breast, AJCC 8th Edition - Pathologic stage from 12/29/2022: Stage IA (pT1a, pN0, cM0, G1, ER+, PR+, HER2-) - Signed by Lanny Callander, MD on 01/28/2023 Histologic grading system: 3 grade system Residual tumor (R): R0 - None      Discussed the use of AI scribe software for clinical note transcription with the patient, who gave verbal consent to proceed.  History of Present Illness Sharon Rose is an 83 year old female with breast cancer who presents for follow-up. She is accompanied by her son.  Diagnosed with breast cancer in October 2024, she has been on tamoxifen  since March 2025. She experiences mild hot flashes, feeling cold yet hot simultaneously. She has undergone surgery on both breasts.  She is on Coumadin  and monitors her levels every two weeks to a month. There have been no recent bleeding issues, and her Coumadin  checks have been within the normal range. Her hemoglobin is slightly low at 11.8, down from 12.4 previously, but she has not noticed any  symptoms of bleeding.     All other systems were reviewed with the patient and are negative.  MEDICAL HISTORY:  Past Medical History:  Diagnosis Date   Age-related memory disorder 12/20/2022   pt reports   Breast cancer (HCC)    CAD (coronary artery disease)    NUCLEAR STRESS TEST, 10/07/2008 - no ischemia   Dyspnea on exertion    H/O aortic valve replacement    2D ECHO, 04/26/2011 - EF 50-55%, normal-mild   History of radiation therapy    Left breast-02/02/23-03/03/23- Dr. Lynwood Nasuti   Hypertension    NSTEMI (non-ST elevated myocardial infarction) (HCC) 12/2004   Secondary to occlusion of OM 2, small vessel and medical therapy recommended; cath 2011 with no CAD    SURGICAL HISTORY: Past Surgical History:  Procedure Laterality Date   ABDOMINAL HYSTERECTOMY     AORTIC VALVE REPLACEMENT  09/12/2004   [employer] Jude mechanical aortic valve, model #23AK-501, serial #[id number]   BREAST LUMPECTOMY WITH RADIOACTIVE SEED LOCALIZATION Bilateral 12/29/2022   Procedure: BILATERAL RADIOACTIVE SEED GUIDED LUMPECTOMIES;  Surgeon: Vernetta Berg, MD;  Location: [facility];  Service: General;  Laterality: Bilateral;   CARDIAC CATHETERIZATION Left 03/25/2009   Normal cors   CARDIAC CATHETERIZATION  01/20/2005   Medical management for occluded and possibly revascularized OM 2, string sign seen   CARDIAC CATHETERIZATION  08/11/2004   Elective aortic valve replacement, severe aortic valvre stenosis with functional class II to III symptomatology    I have reviewed the social history and family history with the patient and they are unchanged from previous note.  ALLERGIES:  is allergic to amlodipine  besylate, clindamycin hcl, codeine, gabapentin, hydrocodone, nitroglycerin , statins, sulfa  antibiotics, verapamil, and diclofenac sodium.  MEDICATIONS:  Current Outpatient Medications  Medication Sig Dispense Refill   aspirin  EC 81 MG tablet Take 81 mg by mouth daily.     calcium   carbonate (OS-CAL) 600 MG TABS Take 600 mg by mouth 2 (two) times daily with a meal.     losartan  (COZAAR ) 100 MG tablet TAKE 1 TABLET BY MOUTH EVERY DAY (STOP TAKING THE LOSARTAN -HCTZ COMBO TAB) 90 tablet 1   metoprolol  succinate (TOPROL -XL) 25 MG 24 hr tablet TAKE 1/2 TABLET BY MOUTH DAILY 45 tablet 3   Multiple Vitamin (MULTIVITAMIN) tablet Take 1 tablet by mouth daily.     omeprazole (PRILOSEC) 20 MG capsule Take 20 mg by mouth daily.     rosuvastatin  (CRESTOR ) 5 MG tablet Take 5 mg by mouth every evening.      tamoxifen  (NOLVADEX ) 20 MG tablet TAKE 1 TABLET BY MOUTH EVERY DAY 90 tablet 1   traMADol  (ULTRAM ) 50 MG tablet Take 1 tablet (50 mg total) by mouth every 6 (six) hours as needed for moderate pain (pain score 4-6) or severe pain (pain score 7-10). 20 tablet 0   warfarin (COUMADIN ) 5 MG tablet Take 5 mg by mouth.     warfarin (COUMADIN ) 5 MG tablet TAKE 1/2 TABLET TO 1 TABLET BY MOUTH DAILY AS DIRECTED BY COUMADIN  CLINIC 90 tablet 1   No current facility-administered medications for this visit.    PHYSICAL EXAMINATION: ECOG PERFORMANCE STATUS: 1 - Symptomatic but completely ambulatory  Vitals:   09/07/23 1105  BP: (!) 142/72  Pulse: 72  Resp: 14  Temp: 98 F (36.7 C)  SpO2: 98%   Wt Readings from Last 3 Encounters:  09/07/23 168 lb 9.6 oz (76.5 kg)  07/07/23 167 lb 6.4 oz (75.9 kg)  05/31/23 166 lb 3.2 oz (75.4 kg)     GENERAL:alert, no distress and comfortable SKIN: skin color, texture, turgor are normal, no rashes or significant lesions EYES: normal, Conjunctiva are pink and non-injected, sclera clear NECK: supple, thyroid  normal size, non-tender, without nodularity LYMPH:  no palpable lymphadenopathy in the cervical, axillary  LUNGS: clear to auscultation and percussion with normal breathing effort HEART: regular rate & rhythm and no murmurs and no lower extremity edema ABDOMEN:abdomen soft, non-tender and normal bowel sounds Musculoskeletal:no cyanosis of digits  and no clubbing  NEURO: alert & oriented x 3 with fluent speech, no focal motor/sensory deficits  Physical Exam BREAST: Breasts slightly swollen, no significant abnormalities. SKIN: Multiple moles present, some keratosis.  LABORATORY DATA:  I have reviewed the data as listed    Latest Ref Rng & Units 09/07/2023   10:24 AM 03/01/2023   12:16 PM 11/17/2022   12:30 PM  CBC  WBC 4.0 - 10.5 K/uL 4.6  4.8  5.7   Hemoglobin 12.0 - 15.0 g/dL 88.1  87.5  87.0   Hematocrit 36.0 - 46.0 % 34.8  36.3  37.6   Platelets 150 - 400 K/uL 216  255  253         Latest Ref Rng & Units 09/07/2023   10:24 AM 03/01/2023   12:16 PM 11/17/2022   12:30 PM  CMP  Glucose 70 - 99 mg/dL 93  899  81   BUN 8 - 23 mg/dL 15  18  21    Creatinine 0.44 - 1.00 mg/dL 9.21  9.23  9.19   Sodium 135 - 145 mmol/L 142  141  139   Potassium 3.5 - 5.1 mmol/L 3.8  3.5  4.2   Chloride 98 - 111 mmol/L 107  105  104   CO2 22 - 32 mmol/L 32  31  32   Calcium  8.9 - 10.3 mg/dL 8.8  9.7  89.9   Total Protein 6.5 - 8.1 g/dL 6.6  6.8  7.0   Total Bilirubin 0.0 - 1.2 mg/dL 0.5  0.9  0.7   Alkaline Phos 38 - 126 U/L 121  72  68   AST 15 - 41 U/L 20  22  24    ALT 0 - 44 U/L 11  13  13        RADIOGRAPHIC STUDIES: I have personally reviewed the radiological images as listed and agreed with the findings in the report. No results found.    No orders of the defined types were placed in this encounter.  All questions were answered. The patient knows to call the clinic with any problems, questions or concerns. No barriers to learning was detected. The total time spent in the appointment was 20 minutes, including review of chart and various tests results, discussions about plan of care and coordination of care plan     Onita Mattock, MD 09/07/2023

## 2023-09-07 NOTE — Assessment & Plan Note (Signed)
 Invasive lobular carcinoma, pT2N0M0 stage IA, ER+/PR+/HER2-, (+) LCIS  -Diagosed in 10/2022, discovered by screening mammogram -Status post bilateral lumpectomy, which showed 2 invasive lobular carcinoma in left breast, 1 with focally positive posterior margin.  -Will complete adjuvant radiation therapy 02/2023 -She started adjuvant tamoxifen  in March 2025

## 2023-09-08 ENCOUNTER — Encounter

## 2023-09-29 ENCOUNTER — Ambulatory Visit: Attending: Cardiovascular Disease | Admitting: *Deleted

## 2023-09-29 DIAGNOSIS — Z5181 Encounter for therapeutic drug level monitoring: Secondary | ICD-10-CM

## 2023-09-29 DIAGNOSIS — Z952 Presence of prosthetic heart valve: Secondary | ICD-10-CM | POA: Diagnosis not present

## 2023-09-29 LAB — POCT INR: POC INR: 2.5

## 2023-09-29 NOTE — Patient Instructions (Signed)
 Description   Continue taking warfarin 1 tablet daily.  Stay consistent with greens each week.  Repeat INR in 4 weeks.     Coumadin  Clinic [phone] * tamoxifen  20 mg - starting on 03/26/23 - monitor INR closely. ( See telephone note from 03/02/2023)

## 2023-09-29 NOTE — Progress Notes (Signed)
 INR 2.5; Please see anticoagulation encounter Lab Results  Component Value Date   INR 2.5 09/29/2023   INR 2.0 08/29/2023   INR 2.4 08/11/2023    Description   Continue taking warfarin 1 tablet daily.  Stay consistent with greens each week.  Repeat INR in 4 weeks.     Coumadin  Clinic [phone] * tamoxifen  20 mg - starting on 03/26/23 - monitor INR closely. ( See telephone note from 03/02/2023)

## 2023-10-06 DIAGNOSIS — Z1331 Encounter for screening for depression: Secondary | ICD-10-CM | POA: Diagnosis not present

## 2023-10-06 DIAGNOSIS — C50811 Malignant neoplasm of overlapping sites of right female breast: Secondary | ICD-10-CM | POA: Diagnosis not present

## 2023-10-06 DIAGNOSIS — K2100 Gastro-esophageal reflux disease with esophagitis, without bleeding: Secondary | ICD-10-CM | POA: Diagnosis not present

## 2023-10-06 DIAGNOSIS — R7303 Prediabetes: Secondary | ICD-10-CM | POA: Diagnosis not present

## 2023-10-06 DIAGNOSIS — N3941 Urge incontinence: Secondary | ICD-10-CM | POA: Diagnosis not present

## 2023-10-06 DIAGNOSIS — D6869 Other thrombophilia: Secondary | ICD-10-CM | POA: Diagnosis not present

## 2023-10-06 DIAGNOSIS — Z0000 Encounter for general adult medical examination without abnormal findings: Secondary | ICD-10-CM | POA: Diagnosis not present

## 2023-10-06 DIAGNOSIS — M519 Unspecified thoracic, thoracolumbar and lumbosacral intervertebral disc disorder: Secondary | ICD-10-CM | POA: Diagnosis not present

## 2023-10-06 DIAGNOSIS — I10 Essential (primary) hypertension: Secondary | ICD-10-CM | POA: Diagnosis not present

## 2023-10-06 DIAGNOSIS — C50812 Malignant neoplasm of overlapping sites of left female breast: Secondary | ICD-10-CM | POA: Diagnosis not present

## 2023-10-06 DIAGNOSIS — E782 Mixed hyperlipidemia: Secondary | ICD-10-CM | POA: Diagnosis not present

## 2023-10-06 DIAGNOSIS — Z952 Presence of prosthetic heart valve: Secondary | ICD-10-CM | POA: Diagnosis not present

## 2023-10-15 ENCOUNTER — Other Ambulatory Visit: Payer: Self-pay | Admitting: Cardiovascular Disease

## 2023-10-27 ENCOUNTER — Ambulatory Visit: Attending: Cardiovascular Disease | Admitting: *Deleted

## 2023-10-27 DIAGNOSIS — Z952 Presence of prosthetic heart valve: Secondary | ICD-10-CM | POA: Diagnosis not present

## 2023-10-27 DIAGNOSIS — Z7901 Long term (current) use of anticoagulants: Secondary | ICD-10-CM

## 2023-10-27 LAB — POCT INR: POC INR: 2.3

## 2023-10-27 NOTE — Patient Instructions (Addendum)
 Description   INR 2.3, Continue taking warfarin 1 tablet daily.  Stay consistent with greens each week.  Repeat INR in 6 weeks.     Coumadin  Clinic [phone] * tamoxifen  20 mg - starting on 03/26/23 - monitor INR closely. ( See telephone note from 03/02/2023)

## 2023-10-27 NOTE — Progress Notes (Signed)
 Lab Results  Component Value Date   INR 2.3 10/27/2023   INR 2.5 09/29/2023   INR 2.0 08/29/2023    Description   INR 2.3, Continue taking warfarin 1 tablet daily.  Stay consistent with greens each week.  Repeat INR in 6 weeks.     Coumadin  Clinic [phone] * tamoxifen  20 mg - starting on 03/26/23 - monitor INR closely. ( See telephone note from 03/02/2023)

## 2023-11-14 DIAGNOSIS — R928 Other abnormal and inconclusive findings on diagnostic imaging of breast: Secondary | ICD-10-CM | POA: Diagnosis not present

## 2023-12-08 ENCOUNTER — Ambulatory Visit

## 2023-12-12 ENCOUNTER — Ambulatory Visit: Attending: Cardiovascular Disease

## 2023-12-12 DIAGNOSIS — Z7901 Long term (current) use of anticoagulants: Secondary | ICD-10-CM

## 2023-12-12 DIAGNOSIS — Z952 Presence of prosthetic heart valve: Secondary | ICD-10-CM

## 2023-12-12 LAB — POCT INR: INR: 2.1 (ref 2.0–3.0)

## 2023-12-12 NOTE — Progress Notes (Signed)
 INR 2.1; Please see anticoagulation encounter Continue taking warfarin 1 tablet daily.  Stay consistent with greens each week.  Repeat INR in 6 weeks.     Coumadin  Clinic [phone] * tamoxifen  20 mg - starting on 03/26/23 - monitor INR closely. ( See telephone note from 03/02/2023)

## 2023-12-12 NOTE — Patient Instructions (Signed)
 Continue taking warfarin 1 tablet daily.  Stay consistent with greens each week.  Repeat INR in 6 weeks.     Coumadin  Clinic [phone] * tamoxifen  20 mg - starting on 03/26/23 - monitor INR closely. ( See telephone note from 03/02/2023)

## 2023-12-17 ENCOUNTER — Other Ambulatory Visit: Payer: Self-pay | Admitting: Hematology

## 2023-12-19 ENCOUNTER — Ambulatory Visit: Admitting: Podiatry

## 2023-12-19 DIAGNOSIS — M79674 Pain in right toe(s): Secondary | ICD-10-CM | POA: Diagnosis not present

## 2023-12-19 DIAGNOSIS — B351 Tinea unguium: Secondary | ICD-10-CM

## 2023-12-19 DIAGNOSIS — M79675 Pain in left toe(s): Secondary | ICD-10-CM

## 2023-12-25 ENCOUNTER — Encounter: Payer: Self-pay | Admitting: Podiatry

## 2023-12-25 NOTE — Progress Notes (Signed)
  Subjective:  Patient ID: Sharon Rose, female    DOB: 1940-10-01,  MRN: [id number]  [name] presents to clinic today for painful mycotic toenails of both feet that are difficult to trim. Pain interferes with daily activities and wearing enclosed shoe gear comfortably.  Chief Complaint  Patient presents with   Toe Pain    She saw Dr. Auston in Sept and denies being diabetic   New problem(s): None.   PCP is Auston Opal, DO.  Allergies  Allergen Reactions   Amlodipine  Besylate Swelling   Clindamycin Hcl     Other Reaction(s): diarrhea   Codeine Nausea And Vomiting and Other (See Comments)    GI upset, dizziness   Gabapentin     Other Reaction(s): depression and muscle pain   Hydrocodone     Other Reaction(s): nausea and vomiting   Nitroglycerin      Other Reaction(s): Low BP   Statins Other (See Comments)    Joint and muscle pain   Sulfa Antibiotics Nausea And Vomiting and Other (See Comments)    Gi upset, dizziness    Verapamil Other (See Comments)    Constipation   Diclofenac Sodium Rash    Review of Systems: Negative except as noted in the HPI.  Objective: No changes noted in today's physical examination. There were no vitals filed for this visit. Sharon Rose is a pleasant 83 y.o. female in NAD. AAO x 3.   Vascular Examination: Capillary refill time immediate b/l. Vascular status intact b/l with palpable DP pulses; faintly palpable PT pulses. Pedal hair absent b/l. No edema. No pain with calf compression b/l. Skin temperature gradient WNL b/l. No cyanosis or clubbing noted b/l. No ischemia or gangrene b/l.   Neurological Examination: Sensation grossly intact b/l with 10 gram monofilament. Vibratory sensation intact b/l.   Dermatological Examination: Pedal skin with normal turgor, texture and tone b/l.  No open wounds. No interdigital macerations.   Toenails 1-5 b/l thick, discolored, elongated with subungual debris and pain on dorsal palpation.   No  hyperkeratotic nor porokeratotic lesions.  Musculoskeletal Examination: Muscle strength 5/5 to all lower extremity muscle groups bilaterally. Hammertoe deformity noted 2-5 b/l.  Radiographs: None  Assessment/Plan: 1. Pain due to onychomycosis of toenails of both feet   Patient was evaluated and treated. All patient's and/or POA's questions/concerns addressed on today's visit. Toenails 1-5 b/l debrided in length and girth without incident. Continue soft, supportive shoe gear daily. Report any pedal injuries to medical professional. Call office if there are any questions/concerns. -Patient/POA to call should there be question/concern in the interim.   Return in about 3 months (around 03/20/2024).  Delon LITTIE Merlin, DPM      [place] LOCATION: [address] [place] [address]                   Office [phone]   [facility] LOCATION: [address] [place], [place] [address] Office [phone]

## 2024-01-02 ENCOUNTER — Telehealth: Payer: Self-pay | Admitting: Cardiovascular Disease

## 2024-01-02 MED ORDER — AMOXICILLIN 500 MG PO TABS: 2000.0000 mg | ORAL_TABLET | Freq: Once | ORAL | 0 refills | Status: AC

## 2024-01-02 NOTE — Telephone Encounter (Signed)
    Pre-operative Risk Assessment    Patient Name: Sharon Rose  DOB: [birth year] MRN: [id number]     Request for Surgical Clearance    Procedure:  Dental Extraction - Amount of Teeth to be Pulled:  1  Date of Surgery:  Clearance 01/04/24                                 Surgeon:  Dr Matilde Socks Group or Practice Name:  [facility] Phone number:  [phone] Fax number:  [phone]   Type of Clearance Requested:   - Pharmacy:  Hold Warfarin (Coumadin ) can she hold for 2 days starting today, her INR today is 3.1   Type of Anesthesia:  Local    Additional requests/questions:    Bonney Frederik Suzen GORMAN   01/02/2024, 10:42 AM

## 2024-01-02 NOTE — Telephone Encounter (Signed)
   Patient Name: Sharon Rose  DOB: 08-01-1940 MRN: [id number]  Primary Cardiologist: Jerel Balding, MD  Chart reviewed as part of pre-operative protocol coverage. Pre-op clearance already addressed by colleagues in earlier phone notes. To summarize recommendations:  -Yes, holding Coumadin  for 2 days is fine. Patient will need antibiotics for dental procedure. I will send Amoxicillin  in.   Will route this bundled recommendation to requesting provider via Epic fax function and remove from pre-op pool. Please call with questions.  Orren LOISE Fabry, PA-C 01/02/2024, 12:48 PM

## 2024-01-23 ENCOUNTER — Ambulatory Visit: Attending: Cardiovascular Disease

## 2024-01-23 DIAGNOSIS — Z952 Presence of prosthetic heart valve: Secondary | ICD-10-CM | POA: Diagnosis not present

## 2024-01-23 DIAGNOSIS — Z7901 Long term (current) use of anticoagulants: Secondary | ICD-10-CM | POA: Diagnosis not present

## 2024-01-23 LAB — POCT INR: INR: 2.6 (ref 2.0–3.0)

## 2024-01-23 NOTE — Progress Notes (Signed)
"   INR 2.6 Please see anticoagulation encounter Continue taking warfarin 1 tablet daily.  Stay consistent with greens each week.  Repeat INR in 6 weeks.     Coumadin  Clinic [phone] * tamoxifen  20 mg - starting on 03/26/23 - monitor INR closely. ( See telephone note from 03/02/2023) "

## 2024-01-23 NOTE — Patient Instructions (Signed)
 Continue taking warfarin 1 tablet daily.  Stay consistent with greens each week.  Repeat INR in 6 weeks.     Coumadin  Clinic [phone] * tamoxifen  20 mg - starting on 03/26/23 - monitor INR closely. ( See telephone note from 03/02/2023)

## 2024-02-08 ENCOUNTER — Other Ambulatory Visit: Payer: Self-pay

## 2024-02-15 ENCOUNTER — Other Ambulatory Visit: Payer: Self-pay | Admitting: Cardiovascular Disease

## 2024-02-15 DIAGNOSIS — Z7901 Long term (current) use of anticoagulants: Secondary | ICD-10-CM

## 2024-02-15 DIAGNOSIS — Z952 Presence of prosthetic heart valve: Secondary | ICD-10-CM

## 2024-02-15 NOTE — Telephone Encounter (Signed)
 Warfarin 5mg  Dx-H/O aortic valve replacement  Last INR Check-01/23/24 Last OV- 07/07/23

## 2024-03-02 ENCOUNTER — Other Ambulatory Visit: Payer: Self-pay

## 2024-03-02 DIAGNOSIS — C50412 Malignant neoplasm of upper-outer quadrant of left female breast: Secondary | ICD-10-CM

## 2024-03-02 DIAGNOSIS — C50811 Malignant neoplasm of overlapping sites of right female breast: Secondary | ICD-10-CM

## 2024-03-02 DIAGNOSIS — Z170 Estrogen receptor positive status [ER+]: Secondary | ICD-10-CM

## 2024-03-05 ENCOUNTER — Ambulatory Visit

## 2024-03-05 ENCOUNTER — Inpatient Hospital Stay

## 2024-03-05 ENCOUNTER — Inpatient Hospital Stay: Admitting: Nurse Practitioner
